# Patient Record
Sex: Female | Born: 1935
Health system: Southern US, Community
[De-identification: ages and names within clinical notes are randomized; demographics above are authoritative.]

## PROBLEM LIST (undated history)

## (undated) DIAGNOSIS — E0590 Thyrotoxicosis, unspecified without thyrotoxic crisis or storm: Secondary | ICD-10-CM

## (undated) DIAGNOSIS — E7800 Pure hypercholesterolemia, unspecified: Secondary | ICD-10-CM

## (undated) DIAGNOSIS — M1990 Unspecified osteoarthritis, unspecified site: Secondary | ICD-10-CM

## (undated) DIAGNOSIS — T7840XA Allergy, unspecified, initial encounter: Secondary | ICD-10-CM

## (undated) DIAGNOSIS — N6019 Diffuse cystic mastopathy of unspecified breast: Secondary | ICD-10-CM

## (undated) DIAGNOSIS — Z87898 Personal history of other specified conditions: Secondary | ICD-10-CM

## (undated) DIAGNOSIS — G250 Essential tremor: Secondary | ICD-10-CM

## (undated) DIAGNOSIS — E119 Type 2 diabetes mellitus without complications: Secondary | ICD-10-CM

## (undated) DIAGNOSIS — R011 Cardiac murmur, unspecified: Secondary | ICD-10-CM

## (undated) HISTORY — DX: Personal history of other specified conditions: Z87.898

## (undated) HISTORY — DX: Allergy, unspecified, initial encounter: T78.40XA

## (undated) HISTORY — DX: Essential tremor: G25.0

## (undated) HISTORY — DX: Thyrotoxicosis, unspecified without thyrotoxic crisis or storm: E05.90

## (undated) HISTORY — PX: BREAST BIOPSY: SHX20

## (undated) HISTORY — PX: SHOULDER ARTHROSCOPY: SHX128

## (undated) HISTORY — DX: Diffuse cystic mastopathy of unspecified breast: N60.19

## (undated) HISTORY — DX: Pure hypercholesterolemia, unspecified: E78.00

## (undated) HISTORY — DX: Type 2 diabetes mellitus without complications: E11.9

## (undated) HISTORY — PX: TONSILLECTOMY: SUR1361

## (undated) HISTORY — PX: CARPAL TUNNEL RELEASE: SHX101

---

## 2005-02-11 ENCOUNTER — Ambulatory Visit: Payer: Self-pay | Admitting: Internal Medicine

## 2005-05-06 ENCOUNTER — Ambulatory Visit: Payer: Self-pay | Admitting: Internal Medicine

## 2005-05-14 ENCOUNTER — Ambulatory Visit: Payer: Self-pay | Admitting: Internal Medicine

## 2006-03-17 ENCOUNTER — Ambulatory Visit: Payer: Self-pay | Admitting: Internal Medicine

## 2006-11-06 DIAGNOSIS — Z85828 Personal history of other malignant neoplasm of skin: Secondary | ICD-10-CM

## 2006-11-06 HISTORY — DX: Personal history of other malignant neoplasm of skin: Z85.828

## 2007-03-23 ENCOUNTER — Ambulatory Visit: Payer: Self-pay | Admitting: Internal Medicine

## 2007-03-25 ENCOUNTER — Ambulatory Visit: Payer: Self-pay | Admitting: Internal Medicine

## 2007-05-24 ENCOUNTER — Inpatient Hospital Stay: Payer: Self-pay | Admitting: Specialist

## 2007-05-24 ENCOUNTER — Other Ambulatory Visit: Payer: Self-pay

## 2008-03-28 ENCOUNTER — Ambulatory Visit: Payer: Self-pay | Admitting: Internal Medicine

## 2008-06-27 ENCOUNTER — Ambulatory Visit: Payer: Self-pay | Admitting: Internal Medicine

## 2008-07-28 ENCOUNTER — Ambulatory Visit: Payer: Self-pay | Admitting: Internal Medicine

## 2008-08-28 ENCOUNTER — Ambulatory Visit: Payer: Self-pay | Admitting: Internal Medicine

## 2008-09-25 ENCOUNTER — Ambulatory Visit: Payer: Self-pay | Admitting: Internal Medicine

## 2009-03-29 ENCOUNTER — Ambulatory Visit: Payer: Self-pay | Admitting: Internal Medicine

## 2010-04-02 ENCOUNTER — Ambulatory Visit: Payer: Self-pay | Admitting: Internal Medicine

## 2011-04-07 ENCOUNTER — Ambulatory Visit: Payer: Self-pay | Admitting: Internal Medicine

## 2011-12-03 DIAGNOSIS — R5381 Other malaise: Secondary | ICD-10-CM | POA: Diagnosis not present

## 2011-12-03 DIAGNOSIS — R5383 Other fatigue: Secondary | ICD-10-CM | POA: Diagnosis not present

## 2011-12-03 DIAGNOSIS — E7800 Pure hypercholesterolemia, unspecified: Secondary | ICD-10-CM | POA: Diagnosis not present

## 2011-12-03 DIAGNOSIS — R7989 Other specified abnormal findings of blood chemistry: Secondary | ICD-10-CM | POA: Diagnosis not present

## 2011-12-03 DIAGNOSIS — E039 Hypothyroidism, unspecified: Secondary | ICD-10-CM | POA: Diagnosis not present

## 2011-12-08 DIAGNOSIS — E039 Hypothyroidism, unspecified: Secondary | ICD-10-CM | POA: Diagnosis not present

## 2011-12-08 DIAGNOSIS — J309 Allergic rhinitis, unspecified: Secondary | ICD-10-CM | POA: Diagnosis not present

## 2011-12-08 DIAGNOSIS — R918 Other nonspecific abnormal finding of lung field: Secondary | ICD-10-CM | POA: Diagnosis not present

## 2011-12-08 DIAGNOSIS — E7800 Pure hypercholesterolemia, unspecified: Secondary | ICD-10-CM | POA: Diagnosis not present

## 2011-12-08 DIAGNOSIS — Z124 Encounter for screening for malignant neoplasm of cervix: Secondary | ICD-10-CM | POA: Diagnosis not present

## 2011-12-08 DIAGNOSIS — R319 Hematuria, unspecified: Secondary | ICD-10-CM | POA: Diagnosis not present

## 2011-12-16 DIAGNOSIS — Z1211 Encounter for screening for malignant neoplasm of colon: Secondary | ICD-10-CM | POA: Diagnosis not present

## 2011-12-29 DIAGNOSIS — R319 Hematuria, unspecified: Secondary | ICD-10-CM | POA: Diagnosis not present

## 2012-01-19 DIAGNOSIS — Z85828 Personal history of other malignant neoplasm of skin: Secondary | ICD-10-CM | POA: Diagnosis not present

## 2012-01-19 DIAGNOSIS — D485 Neoplasm of uncertain behavior of skin: Secondary | ICD-10-CM | POA: Diagnosis not present

## 2012-01-19 DIAGNOSIS — L851 Acquired keratosis [keratoderma] palmaris et plantaris: Secondary | ICD-10-CM | POA: Diagnosis not present

## 2012-01-19 DIAGNOSIS — L738 Other specified follicular disorders: Secondary | ICD-10-CM | POA: Diagnosis not present

## 2012-01-19 DIAGNOSIS — L259 Unspecified contact dermatitis, unspecified cause: Secondary | ICD-10-CM | POA: Diagnosis not present

## 2012-01-19 DIAGNOSIS — C44319 Basal cell carcinoma of skin of other parts of face: Secondary | ICD-10-CM | POA: Diagnosis not present

## 2012-01-19 DIAGNOSIS — L821 Other seborrheic keratosis: Secondary | ICD-10-CM | POA: Diagnosis not present

## 2012-01-26 DIAGNOSIS — C44319 Basal cell carcinoma of skin of other parts of face: Secondary | ICD-10-CM | POA: Diagnosis not present

## 2012-03-06 DIAGNOSIS — H2510 Age-related nuclear cataract, unspecified eye: Secondary | ICD-10-CM | POA: Diagnosis not present

## 2012-04-12 ENCOUNTER — Ambulatory Visit: Payer: Self-pay | Admitting: Internal Medicine

## 2012-04-12 DIAGNOSIS — Z1231 Encounter for screening mammogram for malignant neoplasm of breast: Secondary | ICD-10-CM | POA: Diagnosis not present

## 2012-05-07 DIAGNOSIS — H6120 Impacted cerumen, unspecified ear: Secondary | ICD-10-CM | POA: Diagnosis not present

## 2012-05-07 DIAGNOSIS — H6980 Other specified disorders of Eustachian tube, unspecified ear: Secondary | ICD-10-CM | POA: Diagnosis not present

## 2012-05-07 DIAGNOSIS — J301 Allergic rhinitis due to pollen: Secondary | ICD-10-CM | POA: Diagnosis not present

## 2012-05-10 DIAGNOSIS — Z23 Encounter for immunization: Secondary | ICD-10-CM | POA: Diagnosis not present

## 2012-06-28 DIAGNOSIS — Z85828 Personal history of other malignant neoplasm of skin: Secondary | ICD-10-CM | POA: Diagnosis not present

## 2012-06-28 DIAGNOSIS — D1800 Hemangioma unspecified site: Secondary | ICD-10-CM | POA: Diagnosis not present

## 2012-06-28 DIAGNOSIS — D239 Other benign neoplasm of skin, unspecified: Secondary | ICD-10-CM | POA: Diagnosis not present

## 2012-06-28 DIAGNOSIS — L819 Disorder of pigmentation, unspecified: Secondary | ICD-10-CM | POA: Diagnosis not present

## 2012-06-28 DIAGNOSIS — L821 Other seborrheic keratosis: Secondary | ICD-10-CM | POA: Diagnosis not present

## 2012-08-04 ENCOUNTER — Ambulatory Visit (INDEPENDENT_AMBULATORY_CARE_PROVIDER_SITE_OTHER): Payer: Medicare Other | Admitting: Internal Medicine

## 2012-08-04 ENCOUNTER — Encounter: Payer: Self-pay | Admitting: Internal Medicine

## 2012-08-04 VITALS — BP 118/66 | HR 68 | Temp 97.9°F | Ht 64.0 in | Wt 146.5 lb

## 2012-08-04 DIAGNOSIS — E1169 Type 2 diabetes mellitus with other specified complication: Secondary | ICD-10-CM | POA: Insufficient documentation

## 2012-08-04 DIAGNOSIS — Z139 Encounter for screening, unspecified: Secondary | ICD-10-CM

## 2012-08-04 DIAGNOSIS — M899 Disorder of bone, unspecified: Secondary | ICD-10-CM | POA: Diagnosis not present

## 2012-08-04 DIAGNOSIS — E119 Type 2 diabetes mellitus without complications: Secondary | ICD-10-CM | POA: Diagnosis not present

## 2012-08-04 DIAGNOSIS — E0590 Thyrotoxicosis, unspecified without thyrotoxic crisis or storm: Secondary | ICD-10-CM | POA: Diagnosis not present

## 2012-08-04 DIAGNOSIS — E7800 Pure hypercholesterolemia, unspecified: Secondary | ICD-10-CM | POA: Diagnosis not present

## 2012-08-04 DIAGNOSIS — M8580 Other specified disorders of bone density and structure, unspecified site: Secondary | ICD-10-CM

## 2012-08-04 DIAGNOSIS — M949 Disorder of cartilage, unspecified: Secondary | ICD-10-CM | POA: Diagnosis not present

## 2012-08-09 ENCOUNTER — Encounter: Payer: Self-pay | Admitting: Internal Medicine

## 2012-08-09 DIAGNOSIS — M8580 Other specified disorders of bone density and structure, unspecified site: Secondary | ICD-10-CM | POA: Insufficient documentation

## 2012-08-09 NOTE — Progress Notes (Signed)
  Subjective:    Patient ID: Shannon Mckinney, female    DOB: [birth year], 79 y.o.   MRN: [id number]  HPI 79 year old female with past history of hyperthyroidism s/p ablation now maintained on synthroid, hypercholesterolemia and FCD.  She comes in today for a scheduled follow up.  She states she is doing well.  No chest pain or tightness with increased activity or exertion.  No sob.  Eating and drinking well.  Bowels stable.  No acid reflux.    Past Medical History  Diagnosis Date  . Diabetes mellitus     diet controlled  . Hypercholesterolemia   . History of abnormal Pap smear   . Hyperthyroidism     s/p ablation  . Fibrocystic breast disease   . Benign head tremor     Current Outpatient Prescriptions on File Prior to Visit  Medication Sig Dispense Refill  . levothyroxine (SYNTHROID) 88 MCG tablet Take 88 mcg by mouth daily.      . rosuvastatin (CRESTOR) 5 MG tablet Take 5 mg by mouth daily.        Review of Systems Patient denies any headache, lightheadedness or dizziness.  No sinus or allergy symptoms.  No chest pain, tightness or palpitations.  No increased shortness of breath, cough or congestion.  No nausea or vomiting.  No abdominal pain or cramping.  No bowel change, such as diarrhea, constipation, BRBPR or melana.  No urine change.  Overall she feels she is doing well.       Objective:   Physical Exam Filed Vitals:   08/04/12 1113  BP: 118/66  Pulse: 68  Temp: 97.9 F (36.6 C)   Blood pressure recheck:  118/72, pulse 42  79 year old female in no acute distress.   HEENT:  Nares - clear.  OP- without lesions or erythema.  NECK:  Supple, nontender.  No audible bruit.   HEART:  Appears to be regular.  I/VI systolic murmur.   LUNGS:  Without crackles or wheezing audible.  Respirations even and unlabored.   RADIAL PULSE:  Equal bilaterally.  ABDOMEN:  Soft, nontender.  No audible abdominal bruit.   EXTREMITIES:  No increased edema to be present.                  Assessment  & Plan:  PULMONARY.  Previously had abnormal chest CT.  Followed by pulmonary.  Repeat chest CT did not reveal any lymph nodes that were previously present.  CXR 05/30/07 - no acute cardiopulmonary abnormality.  CXR 11/09/09 - no acute disease of the chest.  They did comment on a mild elevation of the right hemidiaphragm - but reported no significant change.  Obtain copy of last cxr from 12/08/11.    CARDIOVASCULAR.  ECHO 12/11/10 revealed EF 55%, mild LVH and mild AI.  Currently asymptomatic.    HEALTH MAINTENANCE.  Physical 12/08/11.  Obtain mammo results from 2013.  Has declined GI evaluation.

## 2012-08-09 NOTE — Assessment & Plan Note (Signed)
Low carb diet and exercise.  Check metabolic panel and a1c.   

## 2012-08-09 NOTE — Assessment & Plan Note (Signed)
Low cholesterol diet and exercise.  Continue Crestor.  Check lipid panel and liver function.   

## 2012-08-09 NOTE — Assessment & Plan Note (Signed)
S/p ablation.  Now maintained on synthroid.  Check tsh.

## 2012-08-09 NOTE — Assessment & Plan Note (Signed)
Bone density 12/11/10 - osteopenia.  Continue calcium, vitamin D and weight bearing exercise.

## 2012-08-17 ENCOUNTER — Other Ambulatory Visit (INDEPENDENT_AMBULATORY_CARE_PROVIDER_SITE_OTHER): Payer: Medicare Other

## 2012-08-17 DIAGNOSIS — E119 Type 2 diabetes mellitus without complications: Secondary | ICD-10-CM | POA: Diagnosis not present

## 2012-08-17 DIAGNOSIS — E0590 Thyrotoxicosis, unspecified without thyrotoxic crisis or storm: Secondary | ICD-10-CM | POA: Diagnosis not present

## 2012-08-17 DIAGNOSIS — E7800 Pure hypercholesterolemia, unspecified: Secondary | ICD-10-CM

## 2012-08-17 LAB — LIPID PANEL
HDL: 57.4 mg/dL (ref >39.00)
Total CHOL/HDL Ratio: 3
VLDL: 24.2 mg/dL (ref 0.0–40.0)

## 2012-08-17 LAB — HEPATIC FUNCTION PANEL
ALT: 14 U/L (ref 0–35)
Albumin: 4.2 g/dL (ref 3.5–5.2)
Total Bilirubin: 0.9 mg/dL (ref 0.3–1.2)

## 2012-08-17 LAB — BASIC METABOLIC PANEL
BUN: 16 mg/dL (ref 6–23)
Chloride: 100 mEq/L (ref 96–112)
Glucose, Bld: 117 mg/dL — ABNORMAL HIGH (ref 70–99)
Potassium: 4.3 mEq/L (ref 3.5–5.1)

## 2012-08-17 LAB — TSH: TSH: 1.62 u[IU]/mL (ref 0.35–5.50)

## 2012-08-17 LAB — CBC WITH DIFFERENTIAL/PLATELET
Basophils Relative: 0.3 % (ref 0.0–3.0)
Eosinophils Relative: 2.6 % (ref 0.0–5.0)
Lymphocytes Relative: 27.3 % (ref 12.0–46.0)
MCV: 89.8 fl (ref 78.0–100.0)
Monocytes Absolute: 0.4 10*3/uL (ref 0.1–1.0)
Monocytes Relative: 6.8 % (ref 3.0–12.0)
Neutrophils Relative %: 63 % (ref 43.0–77.0)
RBC: 4.53 Mil/uL (ref 3.87–5.11)
WBC: 6.4 10*3/uL (ref 4.5–10.5)

## 2012-12-13 ENCOUNTER — Ambulatory Visit (INDEPENDENT_AMBULATORY_CARE_PROVIDER_SITE_OTHER): Payer: Medicare Other | Admitting: Internal Medicine

## 2012-12-13 ENCOUNTER — Encounter: Payer: Self-pay | Admitting: Internal Medicine

## 2012-12-13 VITALS — BP 130/70 | HR 64 | Temp 97.8°F | Ht 64.0 in | Wt 147.5 lb

## 2012-12-13 DIAGNOSIS — E0590 Thyrotoxicosis, unspecified without thyrotoxic crisis or storm: Secondary | ICD-10-CM | POA: Diagnosis not present

## 2012-12-13 DIAGNOSIS — E119 Type 2 diabetes mellitus without complications: Secondary | ICD-10-CM

## 2012-12-13 DIAGNOSIS — E7800 Pure hypercholesterolemia, unspecified: Secondary | ICD-10-CM | POA: Diagnosis not present

## 2012-12-13 DIAGNOSIS — M949 Disorder of cartilage, unspecified: Secondary | ICD-10-CM | POA: Diagnosis not present

## 2012-12-13 DIAGNOSIS — M8580 Other specified disorders of bone density and structure, unspecified site: Secondary | ICD-10-CM

## 2012-12-13 DIAGNOSIS — M899 Disorder of bone, unspecified: Secondary | ICD-10-CM

## 2012-12-20 ENCOUNTER — Encounter: Payer: Self-pay | Admitting: Internal Medicine

## 2012-12-20 NOTE — Assessment & Plan Note (Signed)
Bone density 12/11/10 - osteopenia.  Continue calcium, vitamin D and weight bearing exercise.

## 2012-12-20 NOTE — Assessment & Plan Note (Signed)
S/p ablation.  Now maintained on synthroid.  Follow tsh.   

## 2012-12-20 NOTE — Assessment & Plan Note (Signed)
Low carb diet and exercise.  Check metabolic panel and a1c.   

## 2012-12-20 NOTE — Progress Notes (Signed)
  Subjective:    Patient ID: Shannon Mckinney, female    DOB: 06-Nov-1928, 79 y.o.   MRN: [id number]  HPI 79 year old female with past history of hyperthyroidism s/p ablation now maintained on synthroid, hypercholesterolemia and FCD.  She comes in today to follow up on these issues as well as for a complete physical exam.  She states she is doing well.  No chest pain or tightness with increased activity or exertion.  No sob.  Eating and drinking well.  Bowels stable.  No acid reflux.  Overall feels good.    Past Medical History  Diagnosis Date  . Diabetes mellitus     diet controlled  . Hypercholesterolemia   . History of abnormal Pap smear   . Hyperthyroidism     s/p ablation  . Fibrocystic breast disease   . Benign head tremor     Current Outpatient Prescriptions on File Prior to Visit  Medication Sig Dispense Refill  . aspirin 81 MG tablet Take 81 mg by mouth daily.      . Cholecalciferol (VITAMIN D-3) 1000 UNITS CAPS Take 1 capsule by mouth daily.      . Cyanocobalamin (VITAMIN B-12 PO) Take by mouth as needed.      Marland Kitchen levothyroxine (SYNTHROID) 88 MCG tablet Take 88 mcg by mouth daily.      . rosuvastatin (CRESTOR) 5 MG tablet Take 5 mg by mouth daily.       No current facility-administered medications on file prior to visit.    Review of Systems Patient denies any headache, lightheadedness or dizziness.  No sinus or allergy symptoms.  No chest pain, tightness or palpitations.  No increased shortness of breath, cough or congestion.  No nausea or vomiting.  No abdominal pain or cramping.  No bowel change, such as diarrhea, constipation, BRBPR or melana.  No urine change.  Overall she feels she is doing well.       Objective:   Physical Exam  Filed Vitals:   12/13/12 1101  BP: 130/70  Pulse: 64  Temp: 97.8 F (36.6 C)   Pulse 32  79 year old female in no acute distress.   HEENT:  Nares- clear.  Oropharynx - without lesions. NECK:  Supple.  Nontender.  No audible bruit.   HEART:  Appears to be regular. LUNGS:  No crackles or wheezing audible.  Respirations even and unlabored.  RADIAL PULSE:  Equal bilaterally.    BREASTS:  No nipple discharge or nipple retraction present.  Could not appreciate any distinct nodules or axillary adenopathy.  ABDOMEN:  Soft, nontender.  Bowel sounds present and normal.  No audible abdominal bruit.  EXTREMITIES:  No increased edema present.  DP pulses palpable and equal bilaterally.          Assessment & Plan:  PULMONARY.  Previously had abnormal chest CT.  Followed by pulmonary.  Repeat chest CT did not reveal any lymph nodes that were previously present.  CXR 05/30/07 - no acute cardiopulmonary abnormality.  CXR 11/09/09 - no acute disease of the chest.  They did comment on a mild elevation of the right hemidiaphragm - but reported no significant change.  CXR 12/08/11 revealed clear lungs.  No acute disease of the chest.   CARDIOVASCULAR.  ECHO 12/11/10 revealed EF 55%, mild LVH and mild AI.  Currently asymptomatic.    HEALTH MAINTENANCE.  Physical today.  Mammogram 04/12/12 -Birads II.  Has declined GI evaluation.

## 2012-12-20 NOTE — Assessment & Plan Note (Signed)
Low cholesterol diet and exercise.  Continue Crestor.  Check lipid panel and liver function.   

## 2012-12-27 ENCOUNTER — Other Ambulatory Visit (INDEPENDENT_AMBULATORY_CARE_PROVIDER_SITE_OTHER): Payer: Medicare Other

## 2012-12-27 DIAGNOSIS — E7800 Pure hypercholesterolemia, unspecified: Secondary | ICD-10-CM

## 2012-12-27 DIAGNOSIS — L819 Disorder of pigmentation, unspecified: Secondary | ICD-10-CM | POA: Diagnosis not present

## 2012-12-27 DIAGNOSIS — E0590 Thyrotoxicosis, unspecified without thyrotoxic crisis or storm: Secondary | ICD-10-CM | POA: Diagnosis not present

## 2012-12-27 DIAGNOSIS — E119 Type 2 diabetes mellitus without complications: Secondary | ICD-10-CM

## 2012-12-27 DIAGNOSIS — D1800 Hemangioma unspecified site: Secondary | ICD-10-CM | POA: Diagnosis not present

## 2012-12-27 DIAGNOSIS — L821 Other seborrheic keratosis: Secondary | ICD-10-CM | POA: Diagnosis not present

## 2012-12-27 DIAGNOSIS — Z85828 Personal history of other malignant neoplasm of skin: Secondary | ICD-10-CM | POA: Diagnosis not present

## 2012-12-27 DIAGNOSIS — D239 Other benign neoplasm of skin, unspecified: Secondary | ICD-10-CM | POA: Diagnosis not present

## 2012-12-27 LAB — LIPID PANEL
Cholesterol: 172 mg/dL (ref 0–200)
HDL: 59.5 mg/dL (ref >39.00)
LDL Cholesterol: 88 mg/dL (ref 0–99)
Total CHOL/HDL Ratio: 3
Triglycerides: 124 mg/dL (ref 0.0–149.0)

## 2012-12-27 LAB — BASIC METABOLIC PANEL
CO2: 26 mEq/L (ref 19–32)
Chloride: 103 mEq/L (ref 96–112)
Glucose, Bld: 105 mg/dL — ABNORMAL HIGH (ref 70–99)
Potassium: 3.8 mEq/L (ref 3.5–5.1)
Sodium: 141 mEq/L (ref 135–145)

## 2012-12-28 ENCOUNTER — Other Ambulatory Visit: Payer: Self-pay | Admitting: Internal Medicine

## 2012-12-28 ENCOUNTER — Other Ambulatory Visit: Payer: Self-pay | Admitting: *Deleted

## 2012-12-28 DIAGNOSIS — E039 Hypothyroidism, unspecified: Secondary | ICD-10-CM

## 2012-12-28 MED ORDER — LEVOTHYROXINE SODIUM 75 MCG PO TABS: 75.0000 ug | ORAL_TABLET | Freq: Every day | ORAL | Status: DC

## 2012-12-28 NOTE — Progress Notes (Signed)
Order placed for f/u tsh.  

## 2012-12-30 ENCOUNTER — Other Ambulatory Visit: Payer: Self-pay | Admitting: *Deleted

## 2012-12-30 MED ORDER — LEVOTHYROXINE SODIUM 75 MCG PO TABS: 75.0000 ug | ORAL_TABLET | Freq: Every day | ORAL | Status: DC

## 2013-02-01 ENCOUNTER — Other Ambulatory Visit (INDEPENDENT_AMBULATORY_CARE_PROVIDER_SITE_OTHER): Payer: Medicare Other

## 2013-02-01 ENCOUNTER — Other Ambulatory Visit: Payer: Self-pay | Admitting: *Deleted

## 2013-02-01 DIAGNOSIS — E039 Hypothyroidism, unspecified: Secondary | ICD-10-CM

## 2013-02-01 LAB — TSH: TSH: 0.99 u[IU]/mL (ref 0.35–5.50)

## 2013-02-03 ENCOUNTER — Encounter: Payer: Self-pay | Admitting: Internal Medicine

## 2013-02-07 ENCOUNTER — Other Ambulatory Visit: Payer: Self-pay | Admitting: *Deleted

## 2013-02-07 MED ORDER — LEVOTHYROXINE SODIUM 75 MCG PO TABS: 75.0000 ug | ORAL_TABLET | Freq: Every day | ORAL | Status: DC

## 2013-02-07 NOTE — Telephone Encounter (Signed)
Sent 30 day supply to local pharmacy because pt is completely out of meds. 90 day sent to [facility] mail also. Pt notified

## 2013-02-16 ENCOUNTER — Other Ambulatory Visit: Payer: Self-pay | Admitting: *Deleted

## 2013-02-16 MED ORDER — ROSUVASTATIN CALCIUM 5 MG PO TABS: 5.0000 mg | ORAL_TABLET | Freq: Every day | ORAL | Status: DC

## 2013-03-02 ENCOUNTER — Other Ambulatory Visit: Payer: Self-pay

## 2013-03-05 DIAGNOSIS — H2510 Age-related nuclear cataract, unspecified eye: Secondary | ICD-10-CM | POA: Diagnosis not present

## 2013-03-16 DIAGNOSIS — H6980 Other specified disorders of Eustachian tube, unspecified ear: Secondary | ICD-10-CM | POA: Diagnosis not present

## 2013-03-16 DIAGNOSIS — H6120 Impacted cerumen, unspecified ear: Secondary | ICD-10-CM | POA: Diagnosis not present

## 2013-04-29 DIAGNOSIS — Z23 Encounter for immunization: Secondary | ICD-10-CM | POA: Diagnosis not present

## 2013-05-21 DIAGNOSIS — Z0000 Encounter for general adult medical examination without abnormal findings: Secondary | ICD-10-CM | POA: Diagnosis not present

## 2013-06-02 ENCOUNTER — Other Ambulatory Visit: Payer: Self-pay

## 2013-06-16 ENCOUNTER — Ambulatory Visit (INDEPENDENT_AMBULATORY_CARE_PROVIDER_SITE_OTHER): Payer: Medicare Other | Admitting: Internal Medicine

## 2013-06-16 ENCOUNTER — Encounter: Payer: Self-pay | Admitting: Internal Medicine

## 2013-06-16 VITALS — BP 110/60 | HR 85 | Temp 98.1°F | Ht 64.0 in | Wt 144.2 lb

## 2013-06-16 DIAGNOSIS — M8580 Other specified disorders of bone density and structure, unspecified site: Secondary | ICD-10-CM

## 2013-06-16 DIAGNOSIS — E0590 Thyrotoxicosis, unspecified without thyrotoxic crisis or storm: Secondary | ICD-10-CM

## 2013-06-16 DIAGNOSIS — Z1239 Encounter for other screening for malignant neoplasm of breast: Secondary | ICD-10-CM

## 2013-06-16 DIAGNOSIS — M899 Disorder of bone, unspecified: Secondary | ICD-10-CM

## 2013-06-16 DIAGNOSIS — E119 Type 2 diabetes mellitus without complications: Secondary | ICD-10-CM

## 2013-06-16 DIAGNOSIS — E7800 Pure hypercholesterolemia, unspecified: Secondary | ICD-10-CM | POA: Diagnosis not present

## 2013-06-16 NOTE — Progress Notes (Signed)
  Subjective:    Patient ID: Shannon Mckinney, female    DOB: 02-Dec-1928, 79 y.o.   MRN: [id number]  HPI 79 year old female with past history of hyperthyroidism s/p ablation now maintained on synthroid, hypercholesterolemia and FCD.  She comes in today for a scheduled follow up.  She states she is doing well.  No chest pain or tightness with increased activity or exertion.  No sob.  Eating and drinking well.  Bowels stable.  No acid reflux.  Overall feels good.    Past Medical History  Diagnosis Date  . Diabetes mellitus     diet controlled  . Hypercholesterolemia   . History of abnormal Pap smear   . Hyperthyroidism     s/p ablation  . Fibrocystic breast disease   . Benign head tremor     Current Outpatient Prescriptions on File Prior to Visit  Medication Sig Dispense Refill  . Cholecalciferol (VITAMIN D-3) 1000 UNITS CAPS Take 1 capsule by mouth daily.      Marland Kitchen levothyroxine (SYNTHROID) 75 MCG tablet Take 1 tablet (75 mcg total) by mouth daily before breakfast.  30 tablet  0  . rosuvastatin (CRESTOR) 5 MG tablet Take 1 tablet (5 mg total) by mouth daily.  30 tablet  0   No current facility-administered medications on file prior to visit.    Review of Systems Patient denies any headache, lightheadedness or dizziness.  No sinus or allergy symptoms.  No chest pain, tightness or palpitations.  No increased shortness of breath, cough or congestion.  No nausea or vomiting.  No abdominal pain or cramping.  No bowel change, such as diarrhea, constipation, BRBPR or melana.  No urine change.  Overall she feels she is doing well.       Objective:   Physical Exam  Filed Vitals:   06/16/13 1410  BP: 110/60  Pulse: 85  Temp: 98.1 F (23.18 C)   79 year old female in no acute distress.   HEENT:  Nares- clear.  Oropharynx - without lesions. NECK:  Supple.  Nontender.  No audible bruit.  HEART:  Appears to be regular. LUNGS:  No crackles or wheezing audible.  Respirations even and unlabored.   RADIAL PULSE:  Equal bilaterally.     ABDOMEN:  Soft, nontender.  Bowel sounds present and normal.  No audible abdominal bruit.  EXTREMITIES:  No increased edema present.  DP pulses palpable and equal bilaterally.          Assessment & Plan:  PULMONARY.  Previously had abnormal chest CT.  Followed by pulmonary.  Repeat chest CT did not reveal any lymph nodes that were previously present.  CXR 05/30/07 - no acute cardiopulmonary abnormality.  CXR 11/09/09 - no acute disease of the chest.  They did comment on a mild elevation of the right hemidiaphragm - but reported no significant change.  CXR 12/08/11 revealed clear lungs.  No acute disease of the chest.   CARDIOVASCULAR.  ECHO 12/11/10 revealed EF 55%, mild LVH and mild AI.  Currently asymptomatic.    HEALTH MAINTENANCE.  Physical last visit.  Mammogram 04/12/12 -Birads II.  Has declined GI evaluation.  Schedule f/u mammogram.

## 2013-06-16 NOTE — Progress Notes (Signed)
Pre-visit discussion using our clinic review tool. No additional management support is needed unless otherwise documented below in the visit note.  

## 2013-06-17 ENCOUNTER — Encounter: Payer: Self-pay | Admitting: Emergency Medicine

## 2013-06-19 ENCOUNTER — Encounter: Payer: Self-pay | Admitting: Internal Medicine

## 2013-06-19 NOTE — Assessment & Plan Note (Signed)
Low carb diet and exercise.  Check metabolic panel and a1c.   

## 2013-06-19 NOTE — Assessment & Plan Note (Signed)
Low cholesterol diet and exercise.  Continue Crestor.  Check lipid panel and liver function.   

## 2013-06-19 NOTE — Assessment & Plan Note (Signed)
Bone density 12/11/10 - osteopenia.  Continue vitamin Dsupplements and weight bearing exercise.

## 2013-06-19 NOTE — Assessment & Plan Note (Signed)
S/p ablation.  Now maintained on synthroid.  Follow tsh.   

## 2013-06-20 ENCOUNTER — Telehealth: Payer: Self-pay | Admitting: Internal Medicine

## 2013-06-20 NOTE — Telephone Encounter (Signed)
FYI

## 2013-06-20 NOTE — Telephone Encounter (Signed)
Record request sent to [facility] for immunizations.  Received fax from [facility] that there are no documents/records for immunizations at [facility] for the pt.  Sent to to scan.

## 2013-06-20 NOTE — Telephone Encounter (Signed)
Notify pt that per [facility] - there are no records of immunizations given there.  If she has not had pneumonia shot anywhere else - then would recommend prevnar.

## 2013-06-21 ENCOUNTER — Encounter: Payer: Self-pay | Admitting: *Deleted

## 2013-06-21 NOTE — Telephone Encounter (Signed)
Sent mychart message

## 2013-06-22 ENCOUNTER — Other Ambulatory Visit (INDEPENDENT_AMBULATORY_CARE_PROVIDER_SITE_OTHER): Payer: Medicare Other

## 2013-06-22 DIAGNOSIS — E7800 Pure hypercholesterolemia, unspecified: Secondary | ICD-10-CM | POA: Diagnosis not present

## 2013-06-22 DIAGNOSIS — E119 Type 2 diabetes mellitus without complications: Secondary | ICD-10-CM

## 2013-06-22 DIAGNOSIS — E0590 Thyrotoxicosis, unspecified without thyrotoxic crisis or storm: Secondary | ICD-10-CM

## 2013-06-22 LAB — MICROALBUMIN / CREATININE URINE RATIO
Creatinine,U: 146.2 mg/dL
Microalb Creat Ratio: 0.7 mg/g (ref 0.0–30.0)
Microalb, Ur: 1 mg/dL (ref 0.0–1.9)

## 2013-06-22 LAB — COMPREHENSIVE METABOLIC PANEL
ALT: 14 U/L (ref 0–35)
AST: 15 U/L (ref 0–37)
Albumin: 4.3 g/dL (ref 3.5–5.2)
BUN: 16 mg/dL (ref 6–23)
CO2: 31 mEq/L (ref 19–32)
Calcium: 9 mg/dL (ref 8.4–10.5)
Chloride: 101 mEq/L (ref 96–112)
Glucose, Bld: 106 mg/dL — ABNORMAL HIGH (ref 70–99)
Potassium: 3.9 mEq/L (ref 3.5–5.1)
Sodium: 137 mEq/L (ref 135–145)
Total Protein: 7.3 g/dL (ref 6.0–8.3)

## 2013-06-22 LAB — LIPID PANEL
Cholesterol: 211 mg/dL — ABNORMAL HIGH (ref 0–200)
Total CHOL/HDL Ratio: 3
Triglycerides: 112 mg/dL (ref 0.0–149.0)

## 2013-06-22 LAB — HEMOGLOBIN A1C: Hgb A1c MFr Bld: 6.6 % — ABNORMAL HIGH (ref 4.6–6.5)

## 2013-06-24 ENCOUNTER — Encounter: Payer: Self-pay | Admitting: Internal Medicine

## 2013-06-28 DIAGNOSIS — D239 Other benign neoplasm of skin, unspecified: Secondary | ICD-10-CM | POA: Diagnosis not present

## 2013-06-28 DIAGNOSIS — D1800 Hemangioma unspecified site: Secondary | ICD-10-CM | POA: Diagnosis not present

## 2013-06-28 DIAGNOSIS — Z85828 Personal history of other malignant neoplasm of skin: Secondary | ICD-10-CM | POA: Diagnosis not present

## 2013-06-28 DIAGNOSIS — L820 Inflamed seborrheic keratosis: Secondary | ICD-10-CM | POA: Diagnosis not present

## 2013-06-28 DIAGNOSIS — L219 Seborrheic dermatitis, unspecified: Secondary | ICD-10-CM | POA: Diagnosis not present

## 2013-06-28 DIAGNOSIS — L821 Other seborrheic keratosis: Secondary | ICD-10-CM | POA: Diagnosis not present

## 2013-06-29 ENCOUNTER — Other Ambulatory Visit: Payer: Self-pay | Admitting: *Deleted

## 2013-06-29 MED ORDER — ROSUVASTATIN CALCIUM 10 MG PO TABS: 10.0000 mg | ORAL_TABLET | Freq: Every day | ORAL | Status: DC

## 2013-07-04 ENCOUNTER — Ambulatory Visit (INDEPENDENT_AMBULATORY_CARE_PROVIDER_SITE_OTHER): Payer: Medicare Other | Admitting: *Deleted

## 2013-07-04 DIAGNOSIS — Z23 Encounter for immunization: Secondary | ICD-10-CM | POA: Diagnosis not present

## 2013-07-29 DIAGNOSIS — R059 Cough, unspecified: Secondary | ICD-10-CM | POA: Diagnosis not present

## 2013-07-29 DIAGNOSIS — R05 Cough: Secondary | ICD-10-CM | POA: Diagnosis not present

## 2013-07-29 DIAGNOSIS — J209 Acute bronchitis, unspecified: Secondary | ICD-10-CM | POA: Diagnosis not present

## 2013-07-29 DIAGNOSIS — J019 Acute sinusitis, unspecified: Secondary | ICD-10-CM | POA: Diagnosis not present

## 2013-07-29 DIAGNOSIS — H6690 Otitis media, unspecified, unspecified ear: Secondary | ICD-10-CM | POA: Diagnosis not present

## 2013-08-10 ENCOUNTER — Ambulatory Visit: Payer: Self-pay | Admitting: Internal Medicine

## 2013-08-10 DIAGNOSIS — Z1231 Encounter for screening mammogram for malignant neoplasm of breast: Secondary | ICD-10-CM | POA: Diagnosis not present

## 2013-08-10 LAB — HM MAMMOGRAPHY: HM MAMMO: NEGATIVE

## 2013-08-11 ENCOUNTER — Encounter: Payer: Self-pay | Admitting: Internal Medicine

## 2013-12-16 ENCOUNTER — Encounter: Payer: Medicare Other | Admitting: Internal Medicine

## 2013-12-27 DIAGNOSIS — L819 Disorder of pigmentation, unspecified: Secondary | ICD-10-CM | POA: Diagnosis not present

## 2013-12-27 DIAGNOSIS — D1800 Hemangioma unspecified site: Secondary | ICD-10-CM | POA: Diagnosis not present

## 2013-12-27 DIAGNOSIS — Z85828 Personal history of other malignant neoplasm of skin: Secondary | ICD-10-CM | POA: Diagnosis not present

## 2013-12-27 DIAGNOSIS — D239 Other benign neoplasm of skin, unspecified: Secondary | ICD-10-CM | POA: Diagnosis not present

## 2013-12-27 DIAGNOSIS — L821 Other seborrheic keratosis: Secondary | ICD-10-CM | POA: Diagnosis not present

## 2013-12-27 DIAGNOSIS — I8390 Asymptomatic varicose veins of unspecified lower extremity: Secondary | ICD-10-CM | POA: Diagnosis not present

## 2014-01-31 ENCOUNTER — Ambulatory Visit (INDEPENDENT_AMBULATORY_CARE_PROVIDER_SITE_OTHER): Payer: Medicare Other | Admitting: Internal Medicine

## 2014-01-31 ENCOUNTER — Encounter: Payer: Self-pay | Admitting: Internal Medicine

## 2014-01-31 VITALS — BP 122/70 | HR 68 | Temp 98.1°F | Ht 63.5 in | Wt 138.5 lb

## 2014-01-31 DIAGNOSIS — E0590 Thyrotoxicosis, unspecified without thyrotoxic crisis or storm: Secondary | ICD-10-CM | POA: Diagnosis not present

## 2014-01-31 DIAGNOSIS — E119 Type 2 diabetes mellitus without complications: Secondary | ICD-10-CM

## 2014-01-31 DIAGNOSIS — M899 Disorder of bone, unspecified: Secondary | ICD-10-CM | POA: Diagnosis not present

## 2014-01-31 DIAGNOSIS — E7800 Pure hypercholesterolemia, unspecified: Secondary | ICD-10-CM

## 2014-01-31 DIAGNOSIS — R221 Localized swelling, mass and lump, neck: Secondary | ICD-10-CM

## 2014-01-31 DIAGNOSIS — M949 Disorder of cartilage, unspecified: Secondary | ICD-10-CM | POA: Diagnosis not present

## 2014-01-31 DIAGNOSIS — M8580 Other specified disorders of bone density and structure, unspecified site: Secondary | ICD-10-CM

## 2014-01-31 DIAGNOSIS — R220 Localized swelling, mass and lump, head: Secondary | ICD-10-CM

## 2014-01-31 MED ORDER — NYSTATIN 100000 UNIT/GM EX CREA: 1.0000 "application " | TOPICAL_CREAM | Freq: Two times a day (BID) | CUTANEOUS | Status: DC

## 2014-01-31 NOTE — Progress Notes (Signed)
Pre visit review using our clinic review tool, if applicable. No additional management support is needed unless otherwise documented below in the visit note. 

## 2014-01-31 NOTE — Assessment & Plan Note (Signed)
S/p ablation.  Now maintained on synthroid.  Follow tsh.

## 2014-01-31 NOTE — Progress Notes (Signed)
  Subjective:    Patient ID: Shannon Mckinney, female    DOB: 1928-09-19, 80 y.o.   MRN: [id number]  HPI 80 year old female with past history of hyperthyroidism s/p ablation now maintained on synthroid, hypercholesterolemia and FCD.  She comes in today to follow up on these issues as well as for a complete physical exam.  She states she is doing well.  No chest pain or tightness with increased activity or exertion.  No sob.  Eating and drinking well.  Bowels stable.  No acid reflux.  Overall feels good.    Past Medical History  Diagnosis Date  . Diabetes mellitus     diet controlled  . Hypercholesterolemia   . History of abnormal Pap smear   . Hyperthyroidism     s/p ablation  . Fibrocystic breast disease   . Benign head tremor     Current Outpatient Prescriptions on File Prior to Visit  Medication Sig Dispense Refill  . Cholecalciferol (VITAMIN D-3) 1000 UNITS CAPS Take 1 capsule by mouth daily.      Marland Kitchen levothyroxine (SYNTHROID) 75 MCG tablet Take 1 tablet (75 mcg total) by mouth daily before breakfast.  30 tablet  0   No current facility-administered medications on file prior to visit.    Review of Systems Patient denies any headache, lightheadedness or dizziness.  No sinus or allergy symptoms.  No chest pain, tightness or palpitations.  No increased shortness of breath, cough or congestion.  No nausea or vomiting.  No abdominal pain or cramping.  No bowel change, such as diarrhea, constipation, BRBPR or melana.  No urine change.  Overall she feels she is doing well.       Objective:   Physical Exam  Filed Vitals:   01/31/14 0931  BP: 122/70  Pulse: 68  Temp: 98.1 F (52.6 C)   80 year old female in no acute distress.   HEENT:  Nares- clear.  Oropharynx - without lesions. NECK:  Supple.  Nontender.  No audible bruit.  Right neck fullness/question of thyroid fullness.  HEART:  Appears to be regular. LUNGS:  No crackles or wheezing audible.  Respirations even and unlabored.   RADIAL PULSE:  Equal bilaterally.    BREASTS:  No nipple discharge or nipple retraction present.  Could not appreciate any distinct nodules or axillary adenopathy.  ABDOMEN:  Soft, nontender.  Bowel sounds present and normal.  No audible abdominal bruit.  GU:  Not performed.   EXTREMITIES:  No increased edema present.  DP pulses palpable and equal bilaterally.          Assessment & Plan:  PULMONARY.  Previously had abnormal chest CT.  Followed by pulmonary.  Repeat chest CT did not reveal any lymph nodes that were previously present.  CXR 05/30/07 - no acute cardiopulmonary abnormality.  CXR 11/09/09 - no acute disease of the chest.  They did comment on a mild elevation of the right hemidiaphragm - but reported no significant change.  CXR 12/08/11 revealed clear lungs.  No acute disease of the chest.   CARDIOVASCULAR.  ECHO 12/11/10 revealed EF 55%, mild LVH and mild AI.  Currently asymptomatic.    HEALTH MAINTENANCE.  Physical today.  Mammogram 08/10/13 -Birads I.  Has declined GI evaluation.   I spent 25 minutes with the patient and more than 50% of the time was spent in consultation regarding the above.

## 2014-02-02 ENCOUNTER — Other Ambulatory Visit: Payer: Medicare Other

## 2014-02-02 LAB — MICROALBUMIN / CREATININE URINE RATIO
Creatinine,U: 218 mg/dL
Microalb Creat Ratio: 0.9 mg/g (ref 0.0–30.0)
Microalb, Ur: 2 mg/dL — ABNORMAL HIGH (ref 0.0–1.9)

## 2014-02-02 LAB — COMPREHENSIVE METABOLIC PANEL
ALBUMIN: 4.4 g/dL (ref 3.5–5.2)
ALT: 13 U/L (ref 0–35)
AST: 18 U/L (ref 0–37)
Alkaline Phosphatase: 54 U/L (ref 39–117)
BUN: 15 mg/dL (ref 6–23)
CO2: 31 mEq/L (ref 19–32)
Calcium: 9.5 mg/dL (ref 8.4–10.5)
Chloride: 100 mEq/L (ref 96–112)
Creatinine, Ser: 0.8 mg/dL (ref 0.4–1.2)
GFR: 76.06 mL/min (ref >60.00)
Glucose, Bld: 115 mg/dL — ABNORMAL HIGH (ref 70–99)
POTASSIUM: 4.3 meq/L (ref 3.5–5.1)
Sodium: 138 mEq/L (ref 135–145)
Total Bilirubin: 1 mg/dL (ref 0.2–1.2)
Total Protein: 7.1 g/dL (ref 6.0–8.3)

## 2014-02-02 LAB — TSH: TSH: 2.41 u[IU]/mL (ref 0.35–4.50)

## 2014-02-02 LAB — LIPID PANEL
CHOL/HDL RATIO: 5
Cholesterol: 292 mg/dL — ABNORMAL HIGH (ref 0–200)
HDL: 62.4 mg/dL (ref >39.00)
LDL CALC: 200 mg/dL — AB (ref 0–99)
NONHDL: 229.6
Triglycerides: 147 mg/dL (ref 0.0–149.0)
VLDL: 29.4 mg/dL (ref 0.0–40.0)

## 2014-02-02 LAB — VITAMIN D 25 HYDROXY (VIT D DEFICIENCY, FRACTURES): VITD: 47.8 ng/mL

## 2014-02-02 LAB — HEMOGLOBIN A1C: Hgb A1c MFr Bld: 6.4 % (ref 4.6–6.5)

## 2014-02-05 ENCOUNTER — Encounter: Payer: Self-pay | Admitting: Internal Medicine

## 2014-02-05 DIAGNOSIS — E041 Nontoxic single thyroid nodule: Secondary | ICD-10-CM | POA: Insufficient documentation

## 2014-02-05 NOTE — Assessment & Plan Note (Signed)
Low cholesterol diet and exercise.  Continue Crestor.  Check lipid panel and liver function.

## 2014-02-05 NOTE — Assessment & Plan Note (Signed)
Fullness of neck noted on exam  Question thyroid fullness.  Check thyroid ultrasound.  Check TFTs.

## 2014-02-05 NOTE — Assessment & Plan Note (Signed)
Bone density 12/11/10 - osteopenia.  Continue vitamin Dsupplements and weight bearing exercise.  Will let me know when agreeable for f/u bone density.

## 2014-02-05 NOTE — Assessment & Plan Note (Signed)
Low carb diet and exercise.  Check metabolic panel and F8B.

## 2014-02-09 ENCOUNTER — Encounter: Payer: Self-pay | Admitting: Internal Medicine

## 2014-02-13 NOTE — Telephone Encounter (Signed)
Mailed unread message to pt  

## 2014-02-14 ENCOUNTER — Ambulatory Visit: Payer: Self-pay | Admitting: Internal Medicine

## 2014-02-14 DIAGNOSIS — R220 Localized swelling, mass and lump, head: Secondary | ICD-10-CM | POA: Diagnosis not present

## 2014-02-14 DIAGNOSIS — E049 Nontoxic goiter, unspecified: Secondary | ICD-10-CM | POA: Diagnosis not present

## 2014-02-14 DIAGNOSIS — E0590 Thyrotoxicosis, unspecified without thyrotoxic crisis or storm: Secondary | ICD-10-CM | POA: Diagnosis not present

## 2014-02-20 ENCOUNTER — Other Ambulatory Visit: Payer: Self-pay | Admitting: *Deleted

## 2014-02-20 ENCOUNTER — Telehealth: Payer: Self-pay | Admitting: Internal Medicine

## 2014-02-20 DIAGNOSIS — E7800 Pure hypercholesterolemia, unspecified: Secondary | ICD-10-CM

## 2014-02-20 MED ORDER — ROSUVASTATIN CALCIUM 10 MG PO TABS: 10.0000 mg | ORAL_TABLET | Freq: Every day | ORAL | Status: DC

## 2014-02-20 MED ORDER — LEVOTHYROXINE SODIUM 75 MCG PO TABS: 75.0000 ug | ORAL_TABLET | Freq: Every day | ORAL | Status: DC

## 2014-02-20 NOTE — Telephone Encounter (Signed)
Left message on VM, lab appoint 8.24.15 @9am 

## 2014-02-20 NOTE — Telephone Encounter (Signed)
Pt was off of crestor and started taking medication had left over from home and is needing to get a refill of her crestor and synthroid.

## 2014-02-20 NOTE — Telephone Encounter (Signed)
Per recent my chart message, she was advised to start back on crestor.  I sent in rx for crestor 10mg  q day #30 with 2 refills.  She needs a liver panel checked in 5 weeks.  Please schedule and notify her of appt date and time.  I have ordered the lab, just please make her an appt time.  Thanks.

## 2014-02-20 NOTE — Telephone Encounter (Signed)
Pt called requesting Crestor refill.  Review of chart Crestor is not on current medication list.  Please advise refill.

## 2014-02-23 ENCOUNTER — Other Ambulatory Visit: Payer: Self-pay | Admitting: *Deleted

## 2014-02-23 MED ORDER — SYNTHROID 75 MCG PO TABS: 75.0000 ug | ORAL_TABLET | Freq: Every day | ORAL | Status: DC

## 2014-03-08 ENCOUNTER — Telehealth: Payer: Self-pay | Admitting: Internal Medicine

## 2014-03-08 DIAGNOSIS — E041 Nontoxic single thyroid nodule: Secondary | ICD-10-CM

## 2014-03-08 NOTE — Telephone Encounter (Signed)
Order placed for endocrinology referral.  Pt notified of thyroid ultrasound.  82mm nodule and residual thyroid tissue in pt with history of iodine ablation.

## 2014-03-14 ENCOUNTER — Encounter: Payer: Self-pay | Admitting: Internal Medicine

## 2014-03-14 ENCOUNTER — Ambulatory Visit (INDEPENDENT_AMBULATORY_CARE_PROVIDER_SITE_OTHER): Payer: Medicare Other | Admitting: Internal Medicine

## 2014-03-14 VITALS — BP 114/62 | HR 78 | Temp 97.9°F | Wt 139.0 lb

## 2014-03-14 DIAGNOSIS — W57XXXA Bitten or stung by nonvenomous insect and other nonvenomous arthropods, initial encounter: Secondary | ICD-10-CM | POA: Diagnosis not present

## 2014-03-14 DIAGNOSIS — S80862A Insect bite (nonvenomous), left lower leg, initial encounter: Secondary | ICD-10-CM

## 2014-03-14 DIAGNOSIS — A6920 Lyme disease, unspecified: Secondary | ICD-10-CM

## 2014-03-14 DIAGNOSIS — S90569A Insect bite (nonvenomous), unspecified ankle, initial encounter: Secondary | ICD-10-CM | POA: Diagnosis not present

## 2014-03-14 MED ORDER — DOXYCYCLINE HYCLATE 100 MG PO TABS: 100.0000 mg | ORAL_TABLET | Freq: Two times a day (BID) | ORAL | Status: DC

## 2014-03-14 NOTE — Progress Notes (Signed)
Subjective:    Patient ID: Shannon Mckinney, female    DOB: 1929/03/02, 80 y.o.   MRN: [id number]  HPI  Pt presents to the clinic today to have a place on the back of her left leg checked. She reports that she noticed this about 1 week ago. The area is very itchy. She has not had any injury to the area that she is aware of. She has been walking her dogs outside more frequently. She has tried neosporin and calamine with some relief.  Review of Systems      Past Medical History  Diagnosis Date  . Diabetes mellitus     diet controlled  . Hypercholesterolemia   . History of abnormal Pap smear   . Hyperthyroidism     s/p ablation  . Fibrocystic breast disease   . Benign head tremor     Current Outpatient Prescriptions  Medication Sig Dispense Refill  . Cholecalciferol (VITAMIN D-3) 1000 UNITS CAPS Take 1 capsule by mouth daily.      Marland Kitchen nystatin cream (MYCOSTATIN) Apply 1 application topically 2 (two) times daily.  30 g  0  . rosuvastatin (CRESTOR) 10 MG tablet Take 1 tablet (10 mg total) by mouth daily.  30 tablet  2  . SYNTHROID 75 MCG tablet Take 1 tablet (75 mcg total) by mouth daily before breakfast.  90 tablet  3   No current facility-administered medications for this visit.    Allergies  Allergen Reactions  . Mysoline [Primidone]   . Penicillins     Family History  Problem Relation Age of Onset  . Heart disease Father     myocardial infarction - died 57  . Diabetes Father   . Heart disease Mother     myocardial infarction  . Heart disease Brother     x2  . Diabetes Brother     x2  . Diabetes Sister   . Breast cancer Neg Hx   . Colon cancer Neg Hx     History   Social History  . Marital Status: Married    Spouse Name: N/A    Number of Children: 2  . Years of Education: N/A   Occupational History  . Not on file.   Social History Main Topics  . Smoking status: Never Smoker   . Smokeless tobacco: Never Used  . Alcohol Use: Yes     Comment: occasional  .  Drug Use: No  . Sexual Activity: Not on file   Other Topics Concern  . Not on file   Social History Narrative  . No narrative on file     Constitutional: Denies fever, malaise, fatigue, headache or abrupt weight changes.  Skin: Pt reports an itchy spot on the back of her left leg. Denies lesions or ulcercations.    No other specific complaints in a complete review of systems (except as listed in HPI above).  Objective:   Physical Exam   BP 114/62  Pulse 78  Temp(Src) 97.9 F (36.6 C) (Oral)  Wt 139 lb (63.05 kg)  SpO2 98% Wt Readings from Last 3 Encounters:  03/14/14 139 lb (63.05 kg)  01/31/14 138 lb 8 oz (62.823 kg)  06/16/13 144 lb 4 oz (65.431 kg)    General: Appears her stated age, well developed, well nourished in NAD. Skin: Baseball size erythema migrans noted on the back of her left leg. Nontender to touch. No cellulitis noted. Cardiovascular: Normal rate and rhythm. S1,S2 noted.  No murmur, rubs or  gallops noted. No JVD or BLE edema. No carotid bruits noted. Pulmonary/Chest: Normal effort and positive vesicular breath sounds. No respiratory distress. No wheezes, rales or ronchi noted.    BMET    Component Value Date/Time   NA 138 02/02/2014 1107   K 4.3 02/02/2014 1107   CL 100 02/02/2014 1107   CO2 31 02/02/2014 1107   GLUCOSE 115* 02/02/2014 1107   BUN 15 02/02/2014 1107   CREATININE 0.8 02/02/2014 1107   CALCIUM 9.5 02/02/2014 1107    Lipid Panel     Component Value Date/Time   CHOL 292* 02/02/2014 1107   TRIG 147.0 02/02/2014 1107   HDL 62.40 02/02/2014 1107   CHOLHDL 5 02/02/2014 1107   VLDL 29.4 02/02/2014 1107   LDLCALC 200* 02/02/2014 1107    CBC    Component Value Date/Time   WBC 6.4 08/17/2012 0904   RBC 4.53 08/17/2012 0904   HGB 13.5 08/17/2012 0904   HCT 40.7 08/17/2012 0904   PLT 180.0 08/17/2012 0904   MCV 89.8 08/17/2012 0904   MCHC 33.2 08/17/2012 0904   RDW 14.0 08/17/2012 0904   LYMPHSABS 1.7 08/17/2012 0904   MONOABS 0.4 08/17/2012 0904   EOSABS 0.2  08/17/2012 0904   BASOSABS 0.0 08/17/2012 0904    Hgb A1C Lab Results  Component Value Date   HGBA1C 6.4 02/02/2014        Assessment & Plan:   Erythema Migrans secondary to possible tick bite:  Will treat with doxycycline BID x 10 days Watch for increased redness, warmth or tenderness Call me if you develop a rash all over  RTC as needed or if symptoms persist or worsen

## 2014-03-14 NOTE — Progress Notes (Signed)
Pre visit review using our clinic review tool, if applicable. No additional management support is needed unless otherwise documented below in the visit note. 

## 2014-03-14 NOTE — Patient Instructions (Addendum)
Tick Bite Information Ticks are insects that attach themselves to the skin and draw blood for food. There are various types of ticks. Common types include wood ticks and deer ticks. Most ticks live in shrubs and grassy areas. Ticks can climb onto your body when you make contact with leaves or grass where the tick is waiting. The most common places on the body for ticks to attach themselves are the scalp, neck, armpits, waist, and groin. Most tick bites are harmless, but sometimes ticks carry germs that cause diseases. These germs can be spread to a person during the tick's feeding process. The chance of a disease spreading through a tick bite depends on:   The type of tick.  Time of year.   How long the tick is attached.   Geographic location.  HOW CAN YOU PREVENT TICK BITES? Take these steps to help prevent tick bites when you are outdoors:  Wear protective clothing. Long sleeves and long pants are best.   Wear white clothes so you can see ticks more easily.  Tuck your pant legs into your socks.   If walking on a trail, stay in the middle of the trail to avoid brushing against bushes.  Avoid walking through areas with long grass.  Put insect repellent on all exposed skin and along boot tops, pant legs, and sleeve cuffs.   Check clothing, hair, and skin repeatedly and before going inside.   Brush off any ticks that are not attached.  Take a shower or bath as soon as possible after being outdoors.  WHAT IS THE PROPER WAY TO REMOVE A TICK? Ticks should be removed as soon as possible to help prevent diseases caused by tick bites. 1. If latex gloves are available, put them on before trying to remove a tick.  2. Using fine-point tweezers, grasp the tick as close to the skin as possible. You may also use curved forceps or a tick removal tool. Grasp the tick as close to its head as possible. Avoid grasping the tick on its body. 3. Pull gently with steady upward pressure until  the tick lets go. Do not twist the tick or jerk it suddenly. This may break off the tick's head or mouth parts. 4. Do not squeeze or crush the tick's body. This could force disease-carrying fluids from the tick into your body.  5. After the tick is removed, wash the bite area and your hands with soap and water or other disinfectant such as alcohol. 6. Apply a small amount of antiseptic cream or ointment to the bite site.  7. Wash and disinfect any instruments that were used.  Do not try to remove a tick by applying a hot match, petroleum jelly, or fingernail polish to the tick. These methods do not work and may increase the chances of disease being spread from the tick bite.  WHEN SHOULD YOU SEEK MEDICAL CARE? Contact your health care provider if you are unable to remove a tick from your skin or if a part of the tick breaks off and is stuck in the skin.  After a tick bite, you need to be aware of signs and symptoms that could be related to diseases spread by ticks. Contact your health care provider if you develop any of the following in the days or weeks after the tick bite:  Unexplained fever.  Rash. A circular rash that appears days or weeks after the tick bite may indicate the possibility of Lyme disease. The rash may resemble   a target with a bull's-eye and may occur at a different part of your body than the tick bite.  Redness and swelling in the area of the tick bite.   Tender, swollen lymph glands.   Diarrhea.   Weight loss.   Cough.   Fatigue.   Muscle, joint, or bone pain.   Abdominal pain.   Headache.   Lethargy or a change in your level of consciousness.  Difficulty walking or moving your legs.   Numbness in the legs.   Paralysis.  Shortness of breath.   Confusion.   Repeated vomiting.  Document Released: 07/11/2000 Document Revised: 05/04/2013 Document Reviewed: 12/22/2012 [employer] Patient Information 2015 [employer]. This information is  not intended to replace advice given to you by your health care provider. Make sure you discuss any questions you have with your health care provider.  

## 2014-03-20 ENCOUNTER — Other Ambulatory Visit: Payer: BLUE CROSS/BLUE SHIELD

## 2014-03-24 ENCOUNTER — Telehealth: Payer: Self-pay

## 2014-03-24 NOTE — Telephone Encounter (Signed)
Left message on voicemail.

## 2014-03-24 NOTE — Telephone Encounter (Signed)
Continue to monitor for now. If redness worsen or expands, call back Monday and will refill abx

## 2014-03-24 NOTE — Telephone Encounter (Signed)
Pt left v/m; pt was seen 03/14/14 and pt has finished antibiotic;pt had tick bite and not as inflamed as when seen but still red and itching badly. Pt wants to know if should have antibiotic refilled or what to do.pt request cb.

## 2014-03-28 ENCOUNTER — Other Ambulatory Visit: Payer: Self-pay | Admitting: Internal Medicine

## 2014-03-28 ENCOUNTER — Other Ambulatory Visit (INDEPENDENT_AMBULATORY_CARE_PROVIDER_SITE_OTHER): Payer: Medicare Other

## 2014-03-28 ENCOUNTER — Encounter: Payer: Self-pay | Admitting: Internal Medicine

## 2014-03-28 DIAGNOSIS — E7800 Pure hypercholesterolemia, unspecified: Secondary | ICD-10-CM

## 2014-03-28 DIAGNOSIS — E0590 Thyrotoxicosis, unspecified without thyrotoxic crisis or storm: Secondary | ICD-10-CM

## 2014-03-28 DIAGNOSIS — E119 Type 2 diabetes mellitus without complications: Secondary | ICD-10-CM

## 2014-03-28 LAB — HEPATIC FUNCTION PANEL
ALK PHOS: 55 U/L (ref 39–117)
ALT: 13 U/L (ref 0–35)
AST: 14 U/L (ref 0–37)
Albumin: 3.9 g/dL (ref 3.5–5.2)
Bilirubin, Direct: 0.2 mg/dL (ref 0.0–0.3)
Total Bilirubin: 1.1 mg/dL (ref 0.2–1.2)
Total Protein: 6.7 g/dL (ref 6.0–8.3)

## 2014-03-28 NOTE — Progress Notes (Signed)
Orders placed for labs

## 2014-03-29 ENCOUNTER — Encounter: Payer: Self-pay | Admitting: *Deleted

## 2014-03-30 NOTE — Telephone Encounter (Signed)
Mailed unread message to pt  

## 2014-03-31 ENCOUNTER — Ambulatory Visit (INDEPENDENT_AMBULATORY_CARE_PROVIDER_SITE_OTHER): Payer: Medicare Other | Admitting: Internal Medicine

## 2014-03-31 ENCOUNTER — Encounter: Payer: Self-pay | Admitting: Internal Medicine

## 2014-03-31 ENCOUNTER — Telehealth: Payer: Self-pay

## 2014-03-31 VITALS — BP 120/70 | HR 69 | Temp 98.5°F | Ht 63.5 in | Wt 140.0 lb

## 2014-03-31 DIAGNOSIS — R21 Rash and other nonspecific skin eruption: Secondary | ICD-10-CM

## 2014-03-31 MED ORDER — TRIAMCINOLONE ACETONIDE 0.1 % EX CREA: 1.0000 "application " | TOPICAL_CREAM | Freq: Two times a day (BID) | CUTANEOUS | Status: DC

## 2014-03-31 MED ORDER — DOXYCYCLINE HYCLATE 100 MG PO TABS: 100.0000 mg | ORAL_TABLET | Freq: Two times a day (BID) | ORAL | Status: DC

## 2014-03-31 NOTE — Patient Instructions (Signed)
Take a probiotic daily

## 2014-03-31 NOTE — Progress Notes (Signed)
Pre visit review using our clinic review tool, if applicable. No additional management support is needed unless otherwise documented below in the visit note. 

## 2014-03-31 NOTE — Telephone Encounter (Signed)
Pt left v/m; pt was seen by Webb Silversmith NP on 03/14/14 and given antibiotic for tick bite. Pt has finished antibiotic and pt continues with red area around tick bite and itching a lot. Pt wants to know if needs refill of antibiotic or what to do. Webb Silversmith NP is out of office and not on computer; note sent to Dr Wilfred Curtis.Please advise. Pt request cb.

## 2014-03-31 NOTE — Telephone Encounter (Signed)
Please see if she can come in today at 4:00 for work in.

## 2014-03-31 NOTE — Telephone Encounter (Signed)
Pt notified & scheduled.

## 2014-04-03 ENCOUNTER — Encounter: Payer: Self-pay | Admitting: Internal Medicine

## 2014-04-03 DIAGNOSIS — R21 Rash and other nonspecific skin eruption: Secondary | ICD-10-CM | POA: Insufficient documentation

## 2014-04-03 NOTE — Assessment & Plan Note (Signed)
Localized to upper leg.  No other rash.  No headache, fever or body aches.  Some increased itching.  Question of local reaction from bite, etc.  Will extend doxycycline x 1 more week.  Apply triamcinolone cream as directed.  Follow.  Call with update over the next several days.  Follow.

## 2014-04-03 NOTE — Progress Notes (Signed)
  Subjective:    Patient ID: Shannon Mckinney, female    DOB: 12/17/1928, 80 y.o.   MRN: [id number]  HPI 80 year old female with past history of hyperthyroidism s/p ablation now maintained on synthroid, hypercholesterolemia and FCD.  She comes in today as a work in with concerns regarding a persistent rash noted left upper leg.  States she was seen at [facility] one week ago.  Was concern regarding a possible tick bite.  Was placed on doxycycline.  Took the whole prescription.  Some minimal improvement.  Comes in today with persistent red rash - upper leg.  Noticed increased itching and previously some heat.  Does not know what bit her.  No swelling.  No other rash.  No fever.  No headache.  No body aches.     Past Medical History  Diagnosis Date  . Diabetes mellitus     diet controlled  . Hypercholesterolemia   . History of abnormal Pap smear   . Hyperthyroidism     s/p ablation  . Fibrocystic breast disease   . Benign head tremor     Current Outpatient Prescriptions on File Prior to Visit  Medication Sig Dispense Refill  . rosuvastatin (CRESTOR) 10 MG tablet Take 1 tablet (10 mg total) by mouth daily.  30 tablet  2  . SYNTHROID 75 MCG tablet Take 1 tablet (75 mcg total) by mouth daily before breakfast.  90 tablet  3  . Cholecalciferol (VITAMIN D-3) 1000 UNITS CAPS Take 1 capsule by mouth daily.       No current facility-administered medications on file prior to visit.    Review of Systems Patient denies any headache, lightheadedness or dizziness.  No fever.  No sinus or allergy symptoms.  No chest pain, tightness or palpitations.  No increased shortness of breath, cough or congestion.  No nausea or vomiting.  No abdominal pain or cramping.  No bowel change, such as diarrhea.  Rash as outlined.  No other rash.  No fever.  No body aches.         Objective:   Physical Exam  Filed Vitals:   03/31/14 1559  BP: 120/70  Pulse: 69  Temp: 98.5 F (62.73 C)   80 year old female in  no acute distress.   EXTREMITIES:  No increased edema present.   SKIN:  Erythematous rash noticed upper leg.  No other rash.          Assessment & Plan:  HEALTH MAINTENANCE.  Physical last visit..  Mammogram 08/10/13 -Birads I.  Has declined GI evaluation.

## 2014-04-15 DIAGNOSIS — H2510 Age-related nuclear cataract, unspecified eye: Secondary | ICD-10-CM | POA: Diagnosis not present

## 2014-04-24 DIAGNOSIS — Z23 Encounter for immunization: Secondary | ICD-10-CM | POA: Diagnosis not present

## 2014-04-27 DIAGNOSIS — E890 Postprocedural hypothyroidism: Secondary | ICD-10-CM | POA: Insufficient documentation

## 2014-04-27 DIAGNOSIS — E041 Nontoxic single thyroid nodule: Secondary | ICD-10-CM | POA: Diagnosis not present

## 2014-04-30 ENCOUNTER — Encounter: Payer: Self-pay | Admitting: Internal Medicine

## 2014-05-10 ENCOUNTER — Encounter: Payer: Self-pay | Admitting: Internal Medicine

## 2014-05-25 ENCOUNTER — Telehealth: Payer: Self-pay | Admitting: Internal Medicine

## 2014-05-25 NOTE — Telephone Encounter (Signed)
Opened in error

## 2014-06-16 ENCOUNTER — Encounter: Payer: Self-pay | Admitting: Internal Medicine

## 2014-06-28 ENCOUNTER — Other Ambulatory Visit: Payer: Medicare Other

## 2014-08-03 ENCOUNTER — Ambulatory Visit (INDEPENDENT_AMBULATORY_CARE_PROVIDER_SITE_OTHER)
Admission: RE | Admit: 2014-08-03 | Discharge: 2014-08-03 | Disposition: A | Discharge status: Home / Self Care | Payer: Medicare Other | Source: Ambulatory Visit | Attending: Internal Medicine | Admitting: Internal Medicine

## 2014-08-03 ENCOUNTER — Ambulatory Visit (INDEPENDENT_AMBULATORY_CARE_PROVIDER_SITE_OTHER): Payer: Medicare Other | Admitting: Internal Medicine

## 2014-08-03 ENCOUNTER — Encounter: Payer: Self-pay | Admitting: Internal Medicine

## 2014-08-03 VITALS — BP 110/50 | HR 80 | Temp 98.1°F | Ht 63.5 in | Wt 136.2 lb

## 2014-08-03 DIAGNOSIS — M25562 Pain in left knee: Secondary | ICD-10-CM

## 2014-08-03 DIAGNOSIS — E7800 Pure hypercholesterolemia, unspecified: Secondary | ICD-10-CM

## 2014-08-03 DIAGNOSIS — E0590 Thyrotoxicosis, unspecified without thyrotoxic crisis or storm: Secondary | ICD-10-CM

## 2014-08-03 DIAGNOSIS — M8580 Other specified disorders of bone density and structure, unspecified site: Secondary | ICD-10-CM

## 2014-08-03 DIAGNOSIS — E119 Type 2 diabetes mellitus without complications: Secondary | ICD-10-CM

## 2014-08-03 NOTE — Progress Notes (Signed)
Pre visit review using our clinic review tool, if applicable. No additional management support is needed unless otherwise documented below in the visit note. 

## 2014-08-03 NOTE — Patient Instructions (Signed)
Can take ibuprofen 200mg  (1-2) with breakfast and 1-2 with supper.    Tylenol Extra strength - 2 tablets midday.

## 2014-08-04 ENCOUNTER — Encounter: Payer: Self-pay | Admitting: Internal Medicine

## 2014-08-04 DIAGNOSIS — M25569 Pain in unspecified knee: Secondary | ICD-10-CM

## 2014-08-06 ENCOUNTER — Encounter: Payer: Self-pay | Admitting: Internal Medicine

## 2014-08-06 NOTE — Progress Notes (Signed)
Subjective:    Patient ID: Shannon Mckinney, female    DOB: December 31, 1928, 81 y.o.   MRN: [id number]  Knee Pain   81 year old female with past history of hyperthyroidism s/p ablation now maintained on synthroid, hypercholesterolemia and FCD.  She comes in today for a scheduled follow up.  States that overall she is doing well.  No chest pain or tightness.  Breathing stable.  No acid reflux reported.  No nausea or vomiting. No bowel change.  States her biggest complaint is that of left knee pain.  Started between Christmas and New Years.  Increased pain with weight bearing.  No pain with sitting.  Has been taking ibuprofen.  No swelling or redness.  Walking aggravates.  No known injury or trauma.      Past Medical History  Diagnosis Date  . Diabetes mellitus     diet controlled  . Hypercholesterolemia   . History of abnormal Pap smear   . Hyperthyroidism     s/p ablation  . Fibrocystic breast disease   . Benign head tremor     Current Outpatient Prescriptions on File Prior to Visit  Medication Sig Dispense Refill  . Cholecalciferol (VITAMIN D-3) 1000 UNITS CAPS Take 1 capsule by mouth daily.    Marland Kitchen SYNTHROID 75 MCG tablet Take 1 tablet (75 mcg total) by mouth daily before breakfast. 90 tablet 3   No current facility-administered medications on file prior to visit.    Review of Systems Patient denies any headache, lightheadedness or dizziness.  No fever.  No sinus or allergy symptoms.  No chest pain, tightness or palpitations.  No increased shortness of breath, cough or congestion.  No nausea or vomiting.  No abdominal pain or cramping.  No bowel change, such as diarrhea.  Left knee pain as outlined.  No known injury or trauma.  No swelling.  Taking ibuprofen.  Walking and weight bearing - aggravate.         Objective:   Physical Exam  Filed Vitals:   08/03/14 1415  BP: 110/50  Pulse: 80  Temp: 98.1 F (36.7 C)   Blood pressure recheck:  24/79  81 year old female in no acute  distress.   HEENT:  Nares- clear.  Oropharynx - without lesions. NECK:  Supple.  Nontender.  No audible bruit.  HEART:  Appears to be regular. LUNGS:  No crackles or wheezing audible.  Respirations even and unlabored.  RADIAL PULSE:  Equal bilaterally.   ABDOMEN:  Soft, nontender.  Bowel sounds present and normal.  No audible abdominal bruit.  EXTREMITIES:  No increased edema present.  DP pulses palpable and equal bilaterally.      MSK:  No pain to palpation over the left knee.  No increased soft tissue swelling (maybe minimal - anterior/laterally).  No increased redness.  No pain with flexion.  Some increased pain with full extension.  No pain with resistance against flexion and extension.  No instability of the knee joint.         Assessment & Plan:  1. Left knee pain Persistent knee pain.  Worse with walking and weight bearing.  Tylenol and ibuprofen alternating as directed.   - DG Knee 1-2 Views Left; Future  2. Type 2 diabetes mellitus without complication Low carb diet.  Follow met b and a1c.  Lab Results  Component Value Date   HGBA1C 6.4 02/02/2014   3. Hyperthyroidism S/p ablation.  Now maintained on synthroid.  Follow tsh.  4. Osteopenia Weight bearing exercise.  Check vitamin D level.    5. Hypercholesterolemia Low cholesterol diet and exercise.  Follow lipid panel.   Lab Results  Component Value Date   CHOL 292* 02/02/2014   HDL 62.40 02/02/2014   LDLCALC 200* 02/02/2014   LDLDIRECT 130.6 06/22/2013   TRIG 147.0 02/02/2014   CHOLHDL 5 02/02/2014   HEALTH MAINTENANCE.  Physical 01/31/14..  Mammogram 08/10/13 -Birads I.  Discussed with her regarding scheduling mammogram for 2016.  She declines.  Wants to skip this year.  Has declined GI evaluation.

## 2014-08-07 NOTE — Telephone Encounter (Signed)
Unread mychart message mailed to patient 

## 2014-08-08 NOTE — Telephone Encounter (Signed)
Order placed for physical therapy.  

## 2014-08-08 NOTE — Addendum Note (Signed)
Addended by: Alisa Graff on: 08/08/2014 05:16 AM   Modules accepted: Orders

## 2014-08-10 ENCOUNTER — Other Ambulatory Visit (INDEPENDENT_AMBULATORY_CARE_PROVIDER_SITE_OTHER): Payer: Medicare Other

## 2014-08-10 DIAGNOSIS — E0590 Thyrotoxicosis, unspecified without thyrotoxic crisis or storm: Secondary | ICD-10-CM

## 2014-08-10 DIAGNOSIS — M25562 Pain in left knee: Secondary | ICD-10-CM | POA: Diagnosis not present

## 2014-08-10 DIAGNOSIS — E7800 Pure hypercholesterolemia, unspecified: Secondary | ICD-10-CM

## 2014-08-10 DIAGNOSIS — M6281 Muscle weakness (generalized): Secondary | ICD-10-CM | POA: Diagnosis not present

## 2014-08-10 DIAGNOSIS — E119 Type 2 diabetes mellitus without complications: Secondary | ICD-10-CM | POA: Diagnosis not present

## 2014-08-10 DIAGNOSIS — M25662 Stiffness of left knee, not elsewhere classified: Secondary | ICD-10-CM | POA: Diagnosis not present

## 2014-08-10 LAB — HEPATIC FUNCTION PANEL
ALBUMIN: 4.3 g/dL (ref 3.5–5.2)
ALK PHOS: 63 U/L (ref 39–117)
ALT: 8 U/L (ref 0–35)
AST: 13 U/L (ref 0–37)
BILIRUBIN DIRECT: 0.1 mg/dL (ref 0.0–0.3)
BILIRUBIN TOTAL: 1.2 mg/dL (ref 0.2–1.2)
Total Protein: 7.5 g/dL (ref 6.0–8.3)

## 2014-08-10 LAB — CBC WITH DIFFERENTIAL/PLATELET
BASOS ABS: 0 10*3/uL (ref 0.0–0.1)
Basophils Relative: 0.4 % (ref 0.0–3.0)
EOS PCT: 1.9 % (ref 0.0–5.0)
Eosinophils Absolute: 0.2 10*3/uL (ref 0.0–0.7)
HCT: 40.5 % (ref 36.0–46.0)
Hemoglobin: 13.4 g/dL (ref 12.0–15.0)
LYMPHS ABS: 1.8 10*3/uL (ref 0.7–4.0)
Lymphocytes Relative: 17.9 % (ref 12.0–46.0)
MCHC: 33.1 g/dL (ref 30.0–36.0)
MCV: 90.5 fl (ref 78.0–100.0)
MONO ABS: 0.9 10*3/uL (ref 0.1–1.0)
Monocytes Relative: 9.1 % (ref 3.0–12.0)
NEUTROS PCT: 70.7 % (ref 43.0–77.0)
Neutro Abs: 7.2 10*3/uL (ref 1.4–7.7)
PLATELETS: 205 10*3/uL (ref 150.0–400.0)
RBC: 4.47 Mil/uL (ref 3.87–5.11)
RDW: 13.7 % (ref 11.5–15.5)
WBC: 10.1 10*3/uL (ref 4.0–10.5)

## 2014-08-10 LAB — BASIC METABOLIC PANEL
BUN: 15 mg/dL (ref 6–23)
CO2: 31 meq/L (ref 19–32)
Calcium: 9.4 mg/dL (ref 8.4–10.5)
Chloride: 99 mEq/L (ref 96–112)
Creatinine, Ser: 0.78 mg/dL (ref 0.40–1.20)
GFR: 74.84 mL/min (ref >60.00)
GLUCOSE: 118 mg/dL — AB (ref 70–99)
Potassium: 3.9 mEq/L (ref 3.5–5.1)
Sodium: 139 mEq/L (ref 135–145)

## 2014-08-10 LAB — LIPID PANEL
Cholesterol: 266 mg/dL — ABNORMAL HIGH (ref 0–200)
HDL: 69.6 mg/dL (ref >39.00)
LDL Cholesterol: 175 mg/dL — ABNORMAL HIGH (ref 0–99)
NonHDL: 196.4
Total CHOL/HDL Ratio: 4
Triglycerides: 108 mg/dL (ref 0.0–149.0)
VLDL: 21.6 mg/dL (ref 0.0–40.0)

## 2014-08-10 LAB — HEMOGLOBIN A1C: HEMOGLOBIN A1C: 6.6 % — AB (ref 4.6–6.5)

## 2014-08-14 DIAGNOSIS — M25662 Stiffness of left knee, not elsewhere classified: Secondary | ICD-10-CM | POA: Diagnosis not present

## 2014-08-14 DIAGNOSIS — M25562 Pain in left knee: Secondary | ICD-10-CM | POA: Diagnosis not present

## 2014-08-14 DIAGNOSIS — M6281 Muscle weakness (generalized): Secondary | ICD-10-CM | POA: Diagnosis not present

## 2014-08-16 DIAGNOSIS — M25662 Stiffness of left knee, not elsewhere classified: Secondary | ICD-10-CM | POA: Diagnosis not present

## 2014-08-16 DIAGNOSIS — M25562 Pain in left knee: Secondary | ICD-10-CM | POA: Diagnosis not present

## 2014-08-16 DIAGNOSIS — M6281 Muscle weakness (generalized): Secondary | ICD-10-CM | POA: Diagnosis not present

## 2014-08-21 DIAGNOSIS — M25662 Stiffness of left knee, not elsewhere classified: Secondary | ICD-10-CM | POA: Diagnosis not present

## 2014-08-21 DIAGNOSIS — M6281 Muscle weakness (generalized): Secondary | ICD-10-CM | POA: Diagnosis not present

## 2014-08-21 DIAGNOSIS — M25562 Pain in left knee: Secondary | ICD-10-CM | POA: Diagnosis not present

## 2014-08-23 DIAGNOSIS — M25562 Pain in left knee: Secondary | ICD-10-CM | POA: Diagnosis not present

## 2014-08-23 DIAGNOSIS — M25662 Stiffness of left knee, not elsewhere classified: Secondary | ICD-10-CM | POA: Diagnosis not present

## 2014-08-23 DIAGNOSIS — M6281 Muscle weakness (generalized): Secondary | ICD-10-CM | POA: Diagnosis not present

## 2014-08-28 DIAGNOSIS — M6281 Muscle weakness (generalized): Secondary | ICD-10-CM | POA: Diagnosis not present

## 2014-08-28 DIAGNOSIS — M25662 Stiffness of left knee, not elsewhere classified: Secondary | ICD-10-CM | POA: Diagnosis not present

## 2014-08-28 DIAGNOSIS — M25562 Pain in left knee: Secondary | ICD-10-CM | POA: Diagnosis not present

## 2014-08-30 DIAGNOSIS — M6281 Muscle weakness (generalized): Secondary | ICD-10-CM | POA: Diagnosis not present

## 2014-08-30 DIAGNOSIS — M25662 Stiffness of left knee, not elsewhere classified: Secondary | ICD-10-CM | POA: Diagnosis not present

## 2014-08-30 DIAGNOSIS — M25562 Pain in left knee: Secondary | ICD-10-CM | POA: Diagnosis not present

## 2014-09-04 DIAGNOSIS — M25562 Pain in left knee: Secondary | ICD-10-CM | POA: Diagnosis not present

## 2014-09-04 DIAGNOSIS — M25662 Stiffness of left knee, not elsewhere classified: Secondary | ICD-10-CM | POA: Diagnosis not present

## 2014-09-04 DIAGNOSIS — M6281 Muscle weakness (generalized): Secondary | ICD-10-CM | POA: Diagnosis not present

## 2014-09-06 DIAGNOSIS — M6281 Muscle weakness (generalized): Secondary | ICD-10-CM | POA: Diagnosis not present

## 2014-09-06 DIAGNOSIS — M25562 Pain in left knee: Secondary | ICD-10-CM | POA: Diagnosis not present

## 2014-09-06 DIAGNOSIS — M25662 Stiffness of left knee, not elsewhere classified: Secondary | ICD-10-CM | POA: Diagnosis not present

## 2014-09-14 DIAGNOSIS — M25562 Pain in left knee: Secondary | ICD-10-CM | POA: Diagnosis not present

## 2014-09-14 DIAGNOSIS — M6281 Muscle weakness (generalized): Secondary | ICD-10-CM | POA: Diagnosis not present

## 2014-09-14 DIAGNOSIS — M25662 Stiffness of left knee, not elsewhere classified: Secondary | ICD-10-CM | POA: Diagnosis not present

## 2014-09-18 DIAGNOSIS — M25662 Stiffness of left knee, not elsewhere classified: Secondary | ICD-10-CM | POA: Diagnosis not present

## 2014-09-18 DIAGNOSIS — M6281 Muscle weakness (generalized): Secondary | ICD-10-CM | POA: Diagnosis not present

## 2014-09-18 DIAGNOSIS — M25562 Pain in left knee: Secondary | ICD-10-CM | POA: Diagnosis not present

## 2014-09-25 DIAGNOSIS — M25562 Pain in left knee: Secondary | ICD-10-CM | POA: Diagnosis not present

## 2014-09-25 DIAGNOSIS — M6281 Muscle weakness (generalized): Secondary | ICD-10-CM | POA: Diagnosis not present

## 2014-09-25 DIAGNOSIS — M25662 Stiffness of left knee, not elsewhere classified: Secondary | ICD-10-CM | POA: Diagnosis not present

## 2014-09-26 ENCOUNTER — Other Ambulatory Visit: Payer: Self-pay | Admitting: Internal Medicine

## 2014-10-25 DIAGNOSIS — H6123 Impacted cerumen, bilateral: Secondary | ICD-10-CM | POA: Diagnosis not present

## 2015-01-02 DIAGNOSIS — D1800 Hemangioma unspecified site: Secondary | ICD-10-CM | POA: Diagnosis not present

## 2015-01-02 DIAGNOSIS — Z85828 Personal history of other malignant neoplasm of skin: Secondary | ICD-10-CM | POA: Diagnosis not present

## 2015-01-02 DIAGNOSIS — Z1283 Encounter for screening for malignant neoplasm of skin: Secondary | ICD-10-CM | POA: Diagnosis not present

## 2015-01-02 DIAGNOSIS — L821 Other seborrheic keratosis: Secondary | ICD-10-CM | POA: Diagnosis not present

## 2015-01-02 DIAGNOSIS — D229 Melanocytic nevi, unspecified: Secondary | ICD-10-CM | POA: Diagnosis not present

## 2015-01-02 DIAGNOSIS — L814 Other melanin hyperpigmentation: Secondary | ICD-10-CM | POA: Diagnosis not present

## 2015-02-15 ENCOUNTER — Ambulatory Visit (INDEPENDENT_AMBULATORY_CARE_PROVIDER_SITE_OTHER): Payer: Medicare Other | Admitting: Internal Medicine

## 2015-02-15 ENCOUNTER — Encounter: Payer: Self-pay | Admitting: Internal Medicine

## 2015-02-15 VITALS — BP 116/70 | HR 81 | Temp 98.3°F | Ht 63.5 in | Wt 137.1 lb

## 2015-02-15 DIAGNOSIS — M8580 Other specified disorders of bone density and structure, unspecified site: Secondary | ICD-10-CM

## 2015-02-15 DIAGNOSIS — E0590 Thyrotoxicosis, unspecified without thyrotoxic crisis or storm: Secondary | ICD-10-CM

## 2015-02-15 DIAGNOSIS — R221 Localized swelling, mass and lump, neck: Secondary | ICD-10-CM

## 2015-02-15 DIAGNOSIS — M25562 Pain in left knee: Secondary | ICD-10-CM

## 2015-02-15 DIAGNOSIS — E119 Type 2 diabetes mellitus without complications: Secondary | ICD-10-CM | POA: Diagnosis not present

## 2015-02-15 DIAGNOSIS — Z0000 Encounter for general adult medical examination without abnormal findings: Secondary | ICD-10-CM

## 2015-02-15 DIAGNOSIS — E7800 Pure hypercholesterolemia, unspecified: Secondary | ICD-10-CM

## 2015-02-15 NOTE — Progress Notes (Signed)
Pre visit review using our clinic review tool, if applicable. No additional management support is needed unless otherwise documented below in the visit note. 

## 2015-02-15 NOTE — Progress Notes (Signed)
Patient ID: Shannon Mckinney, female   DOB: [birth year], 81 y.o.   MRN: [id number]   Subjective:    Patient ID: Shannon Mckinney, female    DOB: 12/23/1928, 81 y.o.   MRN: [id number]  HPI  Patient here for a scheduled follow up.  Reports persistent pain in her left knee.  Limits her activity at times.  No cardiac symptoms with increased activity or exertion.  No sob.  Eating and drinking well.  No acid reflux.  Bowels stable.  Discussed mammogram.  She declines.    Past Medical History  Diagnosis Date  . Diabetes mellitus     diet controlled  . Hypercholesterolemia   . History of abnormal Pap smear   . Hyperthyroidism     s/p ablation  . Fibrocystic breast disease   . Benign head tremor     Outpatient Encounter Prescriptions as of 02/15/2015  Medication Sig  . Cholecalciferol (VITAMIN D-3) 1000 UNITS CAPS Take 1 capsule by mouth daily.  Marland Kitchen SYNTHROID 75 MCG tablet TAKE 1 TABLET (75 MCG TOTAL) BY MOUTH DAILY BEFORE BREAKFAST.  . [DISCONTINUED] SYNTHROID 75 MCG tablet Take 1 tablet (75 mcg total) by mouth daily before breakfast.   No facility-administered encounter medications on file as of 02/15/2015.    Review of Systems  Constitutional: Negative for appetite change and unexpected weight change.  HENT: Negative for congestion and sinus pressure.   Respiratory: Negative for cough, chest tightness and shortness of breath.   Cardiovascular: Negative for chest pain, palpitations and leg swelling.  Gastrointestinal: Negative for nausea, vomiting, abdominal pain and diarrhea.  Musculoskeletal:       Left knee pain as outlined.    Skin: Negative for color change and rash.  Neurological: Negative for dizziness, light-headedness and headaches.  Psychiatric/Behavioral: Negative for dysphoric mood and agitation.       Objective:     Blood pressure recheck:  118/68  Physical Exam  Constitutional: She appears well-developed and well-nourished. No distress.  HENT:  Nose: Nose normal.    Mouth/Throat: Oropharynx is clear and moist.  Neck: Neck supple. No thyromegaly present.  Cardiovascular: Normal rate and regular rhythm.   Pulmonary/Chest: Breath sounds normal. No respiratory distress. She has no wheezes.  Abdominal: Soft. Bowel sounds are normal. There is no tenderness.  Musculoskeletal: She exhibits no edema or tenderness.  Some increased discomfort - with resistance against flexion and extension.   Lymphadenopathy:    She has no cervical adenopathy.  Skin: No rash noted. No erythema.  Psychiatric: She has a normal mood and affect. Her behavior is normal.    BP 116/70 mmHg  Pulse 81  Temp(Src) 98.3 F (36.8 C) (Oral)  Ht 5' 3.5" (1.613 m)  Wt 137 lb 2 oz (62.199 kg)  BMI 23.91 kg/m2  SpO2 97% Wt Readings from Last 3 Encounters:  02/15/15 137 lb 2 oz (62.199 kg)  08/03/14 136 lb 4 oz (61.803 kg)  03/31/14 140 lb (63.504 kg)     Lab Results  Component Value Date   WBC 10.1 08/10/2014   HGB 13.4 08/10/2014   HCT 40.5 08/10/2014   PLT 205.0 08/10/2014   GLUCOSE 118* 08/10/2014   CHOL 266* 08/10/2014   TRIG 108.0 08/10/2014   HDL 69.60 08/10/2014   LDLDIRECT 130.6 06/22/2013   LDLCALC 175* 08/10/2014   ALT 8 08/10/2014   AST 13 08/10/2014   NA 139 08/10/2014   K 3.9 08/10/2014   CL 99 08/10/2014   CREATININE 0.78 08/10/2014  BUN 15 08/10/2014   CO2 31 08/10/2014   TSH 2.41 02/02/2014   HGBA1C 6.6* 08/10/2014   MICROALBUR 2.0* 02/02/2014    Dg Knee 1-2 Views Left  08/03/2014   CLINICAL DATA:  Acute left knee pain for 2 weeks without trauma.  EXAM: LEFT KNEE - 1-2 VIEW  COMPARISON:  None.  FINDINGS: There is no evidence of fracture, dislocation, or joint effusion. Joint spaces are intact. Moderate spurring of superior aspect of patella is noted. Soft tissues are unremarkable.  IMPRESSION: No acute abnormality seen in the left knee. Moderate patellar spurring.   Electronically Signed   By: Sabino Dick M.D.   On: 08/03/2014 16:57        Assessment & Plan:   Problem List Items Addressed This Visit    Diabetes mellitus - Primary    Low carb diet and exercise.  Follow met b and a1c.       Relevant Orders   Hepatic function panel   Hemoglobin H9M   Basic metabolic panel   Microalbumin / creatinine urine ratio   Fullness of neck    Evaluated by Dr Eddie Dibbles 04/27/14 - nodule .8cm.  Recommended f/u thyroid ultrasound in 12 months after last check.       Health care maintenance    Schedule her for a physical.  Declines mammogram.       Hypercholesterolemia    Low cholesterol diet and exercise.  Off crestor.  Check lipid panel.  Intolerant to crestor.       Relevant Orders   Lipid panel   Hyperthyroidism    S/p ablation.  On synthroid.  Follow tsh.       Relevant Orders   TSH   Left knee pain    Check xray.  Further w/up pending results.        Osteopenia    Continue vitamin D and weight bearing exercise.  Will notify me when agreeable for bone density.         I spent 25 minutes with the patient and more than 50% of the time was spent in consultation regarding the above.     Einar Pheasant, MD

## 2015-02-17 ENCOUNTER — Encounter: Payer: Self-pay | Admitting: Internal Medicine

## 2015-02-17 DIAGNOSIS — Z0000 Encounter for general adult medical examination without abnormal findings: Secondary | ICD-10-CM | POA: Insufficient documentation

## 2015-02-17 NOTE — Assessment & Plan Note (Signed)
Evaluated by Dr Eddie Dibbles 04/27/14 - nodule .8cm.  Recommended f/u thyroid ultrasound in 12 months after last check.

## 2015-02-17 NOTE — Assessment & Plan Note (Signed)
Schedule her for a physical.  Declines mammogram.

## 2015-02-17 NOTE — Assessment & Plan Note (Signed)
S/p ablation.  On synthroid.  Follow tsh.

## 2015-02-17 NOTE — Assessment & Plan Note (Signed)
Low cholesterol diet and exercise.  Off crestor.  Check lipid panel.  Intolerant to crestor.

## 2015-02-17 NOTE — Assessment & Plan Note (Signed)
Low carb diet and exercise.  Follow met b and a1c.  

## 2015-02-17 NOTE — Assessment & Plan Note (Signed)
Check xray.  Further w/up pending results.

## 2015-02-17 NOTE — Assessment & Plan Note (Signed)
Continue vitamin D and weight bearing exercise.  Will notify me when agreeable for bone density.

## 2015-03-08 ENCOUNTER — Other Ambulatory Visit (INDEPENDENT_AMBULATORY_CARE_PROVIDER_SITE_OTHER): Payer: Medicare Other

## 2015-03-08 DIAGNOSIS — E7800 Pure hypercholesterolemia, unspecified: Secondary | ICD-10-CM

## 2015-03-08 DIAGNOSIS — E119 Type 2 diabetes mellitus without complications: Secondary | ICD-10-CM | POA: Diagnosis not present

## 2015-03-08 DIAGNOSIS — E0590 Thyrotoxicosis, unspecified without thyrotoxic crisis or storm: Secondary | ICD-10-CM

## 2015-03-08 LAB — MICROALBUMIN / CREATININE URINE RATIO
CREATININE, U: 228.2 mg/dL
MICROALB UR: 5.1 mg/dL — AB (ref 0.0–1.9)
Microalb Creat Ratio: 2.2 mg/g (ref 0.0–30.0)

## 2015-03-08 LAB — LIPID PANEL
CHOL/HDL RATIO: 4
CHOLESTEROL: 309 mg/dL — AB (ref 0–200)
HDL: 68.9 mg/dL (ref >39.00)
LDL Cholesterol: 220 mg/dL — ABNORMAL HIGH (ref 0–99)
NonHDL: 240.13
TRIGLYCERIDES: 101 mg/dL (ref 0.0–149.0)
VLDL: 20.2 mg/dL (ref 0.0–40.0)

## 2015-03-08 LAB — BASIC METABOLIC PANEL
BUN: 16 mg/dL (ref 6–23)
CALCIUM: 9.3 mg/dL (ref 8.4–10.5)
CHLORIDE: 100 meq/L (ref 96–112)
CO2: 30 mEq/L (ref 19–32)
CREATININE: 0.73 mg/dL (ref 0.40–1.20)
GFR: 80.67 mL/min (ref >60.00)
Glucose, Bld: 116 mg/dL — ABNORMAL HIGH (ref 70–99)
Potassium: 4 mEq/L (ref 3.5–5.1)
Sodium: 141 mEq/L (ref 135–145)

## 2015-03-08 LAB — HEPATIC FUNCTION PANEL
ALT: 8 U/L (ref 0–35)
AST: 13 U/L (ref 0–37)
Albumin: 4.4 g/dL (ref 3.5–5.2)
Alkaline Phosphatase: 64 U/L (ref 39–117)
Bilirubin, Direct: 0.1 mg/dL (ref 0.0–0.3)
Total Bilirubin: 0.8 mg/dL (ref 0.2–1.2)
Total Protein: 6.9 g/dL (ref 6.0–8.3)

## 2015-03-08 LAB — HEMOGLOBIN A1C: Hgb A1c MFr Bld: 6.3 % (ref 4.6–6.5)

## 2015-03-08 LAB — TSH: TSH: 3.39 u[IU]/mL (ref 0.35–4.50)

## 2015-03-09 ENCOUNTER — Encounter: Payer: Self-pay | Admitting: *Deleted

## 2015-03-13 NOTE — Telephone Encounter (Signed)
Unread mychart message mailed to patient 

## 2015-03-20 ENCOUNTER — Other Ambulatory Visit: Payer: Self-pay | Admitting: Internal Medicine

## 2015-03-20 ENCOUNTER — Telehealth: Payer: Self-pay | Admitting: *Deleted

## 2015-03-20 ENCOUNTER — Other Ambulatory Visit: Payer: Self-pay | Admitting: *Deleted

## 2015-03-20 DIAGNOSIS — E7800 Pure hypercholesterolemia, unspecified: Secondary | ICD-10-CM

## 2015-03-20 MED ORDER — PRAVASTATIN SODIUM 10 MG PO TABS
10.0000 mg | ORAL_TABLET | Freq: Every day | ORAL | Status: DC
Start: 2015-03-20 — End: 2015-08-01

## 2015-03-20 NOTE — Telephone Encounter (Signed)
Pt came into the clinic states she will try the cholesterol medication as recommended by Dr Nicki Reaper.  Please advise Rx

## 2015-03-20 NOTE — Telephone Encounter (Signed)
I had sent pt a my chart message stating that if she had problems with crestor, we could try a different cholesterol medication.  I had asked if she has tried pravastatin.  If she has not tried pravastatin, then I would like to start her on pravastatin '10mg'$  q day.  Will need liver panel checked in 6 weeks after starting.

## 2015-03-20 NOTE — Progress Notes (Signed)
Order placed for f/u liver panel.  

## 2015-03-20 NOTE — Telephone Encounter (Signed)
Spoke with pt, Rx sent and appoint scheduled

## 2015-04-05 DIAGNOSIS — H2513 Age-related nuclear cataract, bilateral: Secondary | ICD-10-CM | POA: Diagnosis not present

## 2015-04-05 LAB — HM DIABETES EYE EXAM

## 2015-04-10 ENCOUNTER — Encounter: Payer: Self-pay | Admitting: Internal Medicine

## 2015-04-25 ENCOUNTER — Other Ambulatory Visit: Payer: Self-pay | Admitting: Internal Medicine

## 2015-05-01 ENCOUNTER — Other Ambulatory Visit (INDEPENDENT_AMBULATORY_CARE_PROVIDER_SITE_OTHER): Payer: Medicare Other

## 2015-05-01 DIAGNOSIS — E7800 Pure hypercholesterolemia, unspecified: Secondary | ICD-10-CM

## 2015-05-01 LAB — HEPATIC FUNCTION PANEL
ALK PHOS: 61 U/L (ref 39–117)
ALT: 10 U/L (ref 0–35)
AST: 16 U/L (ref 0–37)
Albumin: 4.4 g/dL (ref 3.5–5.2)
Bilirubin, Direct: 0.2 mg/dL (ref 0.0–0.3)
TOTAL PROTEIN: 7.1 g/dL (ref 6.0–8.3)
Total Bilirubin: 1 mg/dL (ref 0.2–1.2)

## 2015-05-02 ENCOUNTER — Other Ambulatory Visit: Payer: Self-pay | Admitting: Internal Medicine

## 2015-05-02 DIAGNOSIS — E119 Type 2 diabetes mellitus without complications: Secondary | ICD-10-CM

## 2015-05-02 DIAGNOSIS — E0590 Thyrotoxicosis, unspecified without thyrotoxic crisis or storm: Secondary | ICD-10-CM

## 2015-05-02 DIAGNOSIS — E7800 Pure hypercholesterolemia, unspecified: Secondary | ICD-10-CM

## 2015-05-02 DIAGNOSIS — M8580 Other specified disorders of bone density and structure, unspecified site: Secondary | ICD-10-CM

## 2015-05-02 NOTE — Progress Notes (Signed)
Orders placed for f/u labs.  

## 2015-05-08 ENCOUNTER — Other Ambulatory Visit: Payer: Self-pay | Admitting: Internal Medicine

## 2015-05-08 DIAGNOSIS — E890 Postprocedural hypothyroidism: Secondary | ICD-10-CM | POA: Diagnosis not present

## 2015-05-08 DIAGNOSIS — E041 Nontoxic single thyroid nodule: Secondary | ICD-10-CM

## 2015-05-11 ENCOUNTER — Ambulatory Visit
Admission: RE | Admit: 2015-05-11 | Discharge: 2015-05-11 | Disposition: A | Discharge status: Home / Self Care | Payer: Medicare Other | Source: Ambulatory Visit | Attending: Internal Medicine | Admitting: Internal Medicine

## 2015-05-11 DIAGNOSIS — E041 Nontoxic single thyroid nodule: Secondary | ICD-10-CM | POA: Diagnosis not present

## 2015-06-04 DIAGNOSIS — Z23 Encounter for immunization: Secondary | ICD-10-CM | POA: Diagnosis not present

## 2015-06-27 DIAGNOSIS — B351 Tinea unguium: Secondary | ICD-10-CM | POA: Diagnosis not present

## 2015-06-27 DIAGNOSIS — M79674 Pain in right toe(s): Secondary | ICD-10-CM | POA: Diagnosis not present

## 2015-06-27 DIAGNOSIS — E119 Type 2 diabetes mellitus without complications: Secondary | ICD-10-CM | POA: Diagnosis not present

## 2015-06-27 DIAGNOSIS — M2170 Unequal limb length (acquired), unspecified site: Secondary | ICD-10-CM | POA: Diagnosis not present

## 2015-06-27 DIAGNOSIS — M79675 Pain in left toe(s): Secondary | ICD-10-CM | POA: Diagnosis not present

## 2015-06-27 LAB — HM DIABETES FOOT EXAM

## 2015-07-10 DIAGNOSIS — M25562 Pain in left knee: Secondary | ICD-10-CM | POA: Diagnosis not present

## 2015-07-10 DIAGNOSIS — M2392 Unspecified internal derangement of left knee: Secondary | ICD-10-CM | POA: Diagnosis not present

## 2015-07-11 ENCOUNTER — Other Ambulatory Visit: Payer: Self-pay | Admitting: Physician Assistant

## 2015-07-11 DIAGNOSIS — M2392 Unspecified internal derangement of left knee: Secondary | ICD-10-CM

## 2015-07-25 ENCOUNTER — Encounter: Payer: Self-pay | Admitting: *Deleted

## 2015-07-27 ENCOUNTER — Telehealth: Payer: Self-pay | Admitting: Internal Medicine

## 2015-07-27 NOTE — Telephone Encounter (Signed)
Pt called stating that her new pharmacy will be [facility]

## 2015-07-27 NOTE — Telephone Encounter (Signed)
Changed in the chart.

## 2015-07-31 ENCOUNTER — Ambulatory Visit
Admission: RE | Admit: 2015-07-31 | Discharge: 2015-07-31 | Disposition: A | Discharge status: Home / Self Care | Payer: Medicare Other | Source: Ambulatory Visit | Attending: Physician Assistant | Admitting: Physician Assistant

## 2015-07-31 DIAGNOSIS — M1712 Unilateral primary osteoarthritis, left knee: Secondary | ICD-10-CM | POA: Insufficient documentation

## 2015-07-31 DIAGNOSIS — M23322 Other meniscus derangements, posterior horn of medial meniscus, left knee: Secondary | ICD-10-CM | POA: Insufficient documentation

## 2015-07-31 DIAGNOSIS — S83282A Other tear of lateral meniscus, current injury, left knee, initial encounter: Secondary | ICD-10-CM | POA: Diagnosis not present

## 2015-07-31 DIAGNOSIS — M25562 Pain in left knee: Secondary | ICD-10-CM | POA: Diagnosis not present

## 2015-07-31 DIAGNOSIS — M2392 Unspecified internal derangement of left knee: Secondary | ICD-10-CM

## 2015-08-01 ENCOUNTER — Other Ambulatory Visit: Payer: Self-pay

## 2015-08-01 ENCOUNTER — Other Ambulatory Visit: Payer: Self-pay | Admitting: Internal Medicine

## 2015-08-01 DIAGNOSIS — E041 Nontoxic single thyroid nodule: Secondary | ICD-10-CM

## 2015-08-01 MED ORDER — PRAVASTATIN SODIUM 10 MG PO TABS: 10.0000 mg | ORAL_TABLET | Freq: Every day | ORAL | Status: DC

## 2015-08-01 MED ORDER — SYNTHROID 75 MCG PO TABS: ORAL_TABLET | ORAL | Status: DC

## 2015-08-14 DIAGNOSIS — M2392 Unspecified internal derangement of left knee: Secondary | ICD-10-CM | POA: Diagnosis not present

## 2015-08-20 ENCOUNTER — Encounter: Payer: Self-pay | Admitting: Internal Medicine

## 2015-08-20 ENCOUNTER — Ambulatory Visit (INDEPENDENT_AMBULATORY_CARE_PROVIDER_SITE_OTHER): Payer: Medicare Other | Admitting: Internal Medicine

## 2015-08-20 VITALS — BP 110/60 | HR 71 | Temp 98.2°F | Resp 18 | Ht 63.0 in | Wt 143.5 lb

## 2015-08-20 DIAGNOSIS — E119 Type 2 diabetes mellitus without complications: Secondary | ICD-10-CM | POA: Diagnosis not present

## 2015-08-20 DIAGNOSIS — Z01818 Encounter for other preprocedural examination: Secondary | ICD-10-CM

## 2015-08-20 DIAGNOSIS — M8580 Other specified disorders of bone density and structure, unspecified site: Secondary | ICD-10-CM | POA: Diagnosis not present

## 2015-08-20 DIAGNOSIS — R011 Cardiac murmur, unspecified: Secondary | ICD-10-CM

## 2015-08-20 DIAGNOSIS — Z0000 Encounter for general adult medical examination without abnormal findings: Secondary | ICD-10-CM

## 2015-08-20 DIAGNOSIS — E0590 Thyrotoxicosis, unspecified without thyrotoxic crisis or storm: Secondary | ICD-10-CM | POA: Diagnosis not present

## 2015-08-20 DIAGNOSIS — E041 Nontoxic single thyroid nodule: Secondary | ICD-10-CM

## 2015-08-20 DIAGNOSIS — E7800 Pure hypercholesterolemia, unspecified: Secondary | ICD-10-CM

## 2015-08-20 DIAGNOSIS — M25562 Pain in left knee: Secondary | ICD-10-CM

## 2015-08-20 NOTE — Progress Notes (Signed)
Pre-visit discussion using our clinic review tool. No additional management support is needed unless otherwise documented below in the visit note.  

## 2015-08-20 NOTE — Progress Notes (Signed)
Patient ID: Shannon Mckinney, female   DOB: 20-Jan-1929, 82 y.o.   MRN: [id number]   Subjective:    Patient ID: Shannon Mckinney, female    DOB: 05-14-1929, 82 y.o.   MRN: [id number]  HPI  Patient with past history of diabetes, hypercholesterolemia and hyperthyroidism s/p ablation with resulting hypothyroidism.  She comes in today to follow up on these issues as well as for a complete physical exam.  She has been having increased pain in her left knee.  Limiting her activity.  She saw Dr Marry Guan last week.  Planning to proceed with surgery (arthroscopy).  She denies any chest pain or tightness.  No sob.  No acid reflux.  No abdominal pain or cramping.  No urine change.  Bowels stable.  No diarrhea.  Saw Dr Eddie Dibbles for her thyroid.  They are following her thyroid with serial ultrasounds.  She declines mammogram.     Past Medical History  Diagnosis Date  . Diabetes mellitus ([place])     diet controlled  . Hypercholesterolemia   . History of abnormal Pap smear   . Hyperthyroidism     s/p ablation  . Fibrocystic breast disease   . Benign head tremor    Past Surgical History  Procedure Laterality Date  . Breast biopsy  70's    benign  . Carpal tunnel release      right hand  . Tonsillectomy     Family History  Problem Relation Age of Onset  . Heart disease Father     myocardial infarction - died 52  . Diabetes Father   . Heart disease Mother     myocardial infarction  . Heart disease Brother     x2  . Diabetes Brother     x2  . Diabetes Sister   . Breast cancer Neg Hx   . Colon cancer Neg Hx    Social History   Social History  . Marital Status: Married    Spouse Name: N/A  . Number of Children: 2  . Years of Education: N/A   Social History Main Topics  . Smoking status: Never Smoker   . Smokeless tobacco: Never Used  . Alcohol Use: 0.0 oz/week    0 Standard drinks or equivalent per week     Comment: occasional  . Drug Use: No  . Sexual Activity: Not Asked   Other Topics Concern  .  None   Social History Narrative    Outpatient Encounter Prescriptions as of 08/20/2015  Medication Sig  . meloxicam (MOBIC) 7.5 MG tablet Take by mouth.  . pravastatin (PRAVACHOL) 10 MG tablet Take 1 tablet (10 mg total) by mouth daily.  Marland Kitchen SYNTHROID 75 MCG tablet TAKE 1 TABLET BY MOUTH DAILY BEFORE BREAKFAST  . [DISCONTINUED] Cholecalciferol (VITAMIN D-3) 1000 UNITS CAPS Take 1 capsule by mouth daily.   No facility-administered encounter medications on file as of 08/20/2015.    Review of Systems  Constitutional: Negative for appetite change and unexpected weight change.  HENT: Negative for congestion and sinus pressure.   Eyes: Negative for pain and visual disturbance.  Respiratory: Negative for cough, chest tightness and shortness of breath.   Cardiovascular: Negative for chest pain, palpitations and leg swelling.  Gastrointestinal: Negative for nausea, vomiting, abdominal pain and diarrhea.  Genitourinary: Negative for dysuria and difficulty urinating.  Musculoskeletal: Negative for back pain.       Persistent left knee pain.  Limiting her activity.    Skin: Negative for color change and  rash.  Neurological: Negative for dizziness, light-headedness and headaches.  Hematological: Negative for adenopathy. Does not bruise/bleed easily.  Psychiatric/Behavioral: Negative for dysphoric mood and agitation.       Objective:     Blood pressure rechecked by me:  120/62  Physical Exam  Constitutional: She is oriented to person, place, and time. She appears well-developed and well-nourished. No distress.  HENT:  Nose: Nose normal.  Mouth/Throat: Oropharynx is clear and moist.  Eyes: Right eye exhibits no discharge. Left eye exhibits no discharge. No scleral icterus.  Neck: Neck supple. No thyromegaly present.  Cardiovascular: Normal rate and regular rhythm.   1/6 systolic murmur.    Pulmonary/Chest: Breath sounds normal. No accessory muscle usage. No tachypnea. No respiratory  distress. She has no decreased breath sounds. She has no wheezes. She has no rhonchi. Right breast exhibits no inverted nipple, no mass, no nipple discharge and no tenderness (no axillary adenopathy). Left breast exhibits no inverted nipple, no mass, no nipple discharge and no tenderness (no axilarry adenopathy).  Abdominal: Soft. Bowel sounds are normal. There is no tenderness.  Musculoskeletal: She exhibits no edema or tenderness.  Lymphadenopathy:    She has no cervical adenopathy.  Neurological: She is alert and oriented to person, place, and time.  Skin: Skin is warm. No rash noted. No erythema.  Psychiatric: She has a normal mood and affect. Her behavior is normal.    BP 110/60 mmHg  Pulse 71  Temp(Src) 98.2 F (36.8 C) (Oral)  Resp 18  Ht _0  (1.6 m)  Wt 143 lb 8 oz (65.091 kg)  BMI 25.43 kg/m2  SpO2 95% Wt Readings from Last 3 Encounters:  08/20/15 143 lb 8 oz (65.091 kg)  02/15/15 137 lb 2 oz (62.199 kg)  08/03/14 136 lb 4 oz (61.803 kg)     Lab Results  Component Value Date   WBC 10.1 08/10/2014   HGB 13.4 08/10/2014   HCT 40.5 08/10/2014   PLT 205.0 08/10/2014   GLUCOSE 116* 03/08/2015   CHOL 309* 03/08/2015   TRIG 101.0 03/08/2015   HDL 68.90 03/08/2015   LDLDIRECT 130.6 06/22/2013   LDLCALC 220* 03/08/2015   ALT 10 05/01/2015   AST 16 05/01/2015   NA 141 03/08/2015   K 4.0 03/08/2015   CL 100 03/08/2015   CREATININE 0.73 03/08/2015   BUN 16 03/08/2015   CO2 30 03/08/2015   TSH 3.39 03/08/2015   HGBA1C 6.3 03/08/2015   MICROALBUR 5.1* 03/08/2015    Mr Knee Left  Wo Contrast  07/31/2015  CLINICAL DATA:  Diffuse left knee pain for 1 year with limited range of motion. No known injury. Subsequent encounter. EXAM: MRI OF THE LEFT KNEE WITHOUT CONTRAST TECHNIQUE: Multiplanar, multisequence MR imaging of the knee was performed. No intravenous contrast was administered. COMPARISON:  None. FINDINGS: MENISCI Medial meniscus: Degenerative signal is seen in the  posterior horn but no tear is identified. Lateral meniscus: Fraying along the free edge of the body is identified. A horizontal tear at the junction of the anterior horn and body reaches the femoral articular surface. LIGAMENTS Cruciates:  Intact. Collaterals:  Intact. CARTILAGE Patellofemoral:  Mildly degenerated. Medial:  Mildly degenerated. Lateral:  Mildly degenerated. Joint:  Very small joint effusion. Popliteal Fossa:  No Baker's cyst. Extensor Mechanism:  Intact. Bones: Small osteophytes are present about the lateral compartment. No fracture, stress change or worrisome marrow lesion. IMPRESSION: Fraying along the free edge of the body of the lateral meniscus with a horizontal tear  at the junction of the anterior horn and body. Degenerative signal posterior horn medial meniscus without tear identified. Very mild osteoarthritis. Electronically Signed   By: Inge Rise M.D.   On: 07/31/2015 11:03       Assessment & Plan:   Problem List Items Addressed This Visit    Diabetes mellitus ([place])    Low carb diet and exercise.  Follow met b and a1c.        Health care maintenance    Physical today 08/20/15.  Declines mammogram.  Has declined GI evaluation.        Hypercholesterolemia    On pravastatin.  Low cholesterol diet and exercise.  Follow lipid panel and liver function tests.        Hyperthyroidism    S/p ablation.  On synthroid.  Follow tsh.        Left knee pain    Persistent left knee pain.  Limiting activity.  Saw Dr Marry Guan.  Wants to proceed with surgery.        Murmur    Murmur is not new.  ECHO 2012 - mild AI and mild LVH.  EF >55%.        Osteopenia    Discussed bone density.  Wants to hold on testing at this time.  Continue vitamin D and weight bearing exercise.        Pre-op evaluation    EKG obtained and revealed SR with non specific ST/T changes.  Reports no chest pain or tightness.  Murmur not new.  Question if slightly more prominent.  Will have cardiology  evaluate prior to her surgery with question of need for further cardiac w/up.  Will need close intra op and post op monitoring of her heart rate and blood pressure to avoid extremes.  Check routine labs.        Thyroid nodule    Being followed by Dr Eddie Dibbles.  Refer to her note for details.  Recommended f/u ultrasound.         Other Visit Diagnoses    Preoperative clearance    -  Primary    Relevant Orders    EKG 12-Lead (Completed)    CBC with Differential/Platelet        Einar Pheasant, MD

## 2015-08-21 ENCOUNTER — Encounter: Payer: Self-pay | Admitting: Internal Medicine

## 2015-08-21 DIAGNOSIS — R011 Cardiac murmur, unspecified: Secondary | ICD-10-CM | POA: Insufficient documentation

## 2015-08-21 DIAGNOSIS — Z01818 Encounter for other preprocedural examination: Secondary | ICD-10-CM | POA: Insufficient documentation

## 2015-08-21 NOTE — Assessment & Plan Note (Signed)
Discussed bone density.  Wants to hold on testing at this time.  Continue vitamin D and weight bearing exercise.

## 2015-08-21 NOTE — Assessment & Plan Note (Signed)
Being followed by Dr Eddie Dibbles.  Refer to her note for details.  Recommended f/u ultrasound.

## 2015-08-21 NOTE — Assessment & Plan Note (Signed)
S/p ablation.  On synthroid.  Follow tsh.

## 2015-08-21 NOTE — Assessment & Plan Note (Signed)
Low carb diet and exercise.  Follow met b and a1c.   

## 2015-08-21 NOTE — Assessment & Plan Note (Signed)
Physical today 08/20/15.  Declines mammogram.  Has declined GI evaluation.

## 2015-08-21 NOTE — Assessment & Plan Note (Signed)
On pravastatin.  Low cholesterol diet and exercise.  Follow lipid panel and liver function tests.   

## 2015-08-21 NOTE — Assessment & Plan Note (Signed)
Murmur is not new.  ECHO 2012 - mild AI and mild LVH.  EF>55% 

## 2015-08-21 NOTE — Assessment & Plan Note (Addendum)
EKG obtained and revealed SR with non specific ST/T changes.  Reports no chest pain or tightness.  Murmur not new.  Question if slightly more prominent.  Will have cardiology evaluate prior to her surgery with question of need for further cardiac w/up.  Will need close intra op and post op monitoring of her heart rate and blood pressure to avoid extremes.  Check routine labs.

## 2015-08-21 NOTE — Addendum Note (Signed)
Addended by: Alisa Graff on: 08/21/2015 06:11 AM   Modules accepted: Orders, SmartSet

## 2015-08-21 NOTE — Assessment & Plan Note (Signed)
Persistent left knee pain.  Limiting activity.  Saw Dr Marry Guan.  Wants to proceed with surgery.

## 2015-08-23 ENCOUNTER — Other Ambulatory Visit (INDEPENDENT_AMBULATORY_CARE_PROVIDER_SITE_OTHER): Payer: Medicare Other

## 2015-08-23 ENCOUNTER — Telehealth: Payer: Self-pay | Admitting: Internal Medicine

## 2015-08-23 DIAGNOSIS — E7800 Pure hypercholesterolemia, unspecified: Secondary | ICD-10-CM

## 2015-08-23 DIAGNOSIS — M8580 Other specified disorders of bone density and structure, unspecified site: Secondary | ICD-10-CM

## 2015-08-23 DIAGNOSIS — Z01818 Encounter for other preprocedural examination: Secondary | ICD-10-CM

## 2015-08-23 DIAGNOSIS — E119 Type 2 diabetes mellitus without complications: Secondary | ICD-10-CM

## 2015-08-23 DIAGNOSIS — E0590 Thyrotoxicosis, unspecified without thyrotoxic crisis or storm: Secondary | ICD-10-CM | POA: Diagnosis not present

## 2015-08-23 LAB — CBC WITH DIFFERENTIAL/PLATELET
BASOS PCT: 0.4 % (ref 0.0–3.0)
Basophils Absolute: 0 10*3/uL (ref 0.0–0.1)
EOS PCT: 2.7 % (ref 0.0–5.0)
Eosinophils Absolute: 0.2 10*3/uL (ref 0.0–0.7)
HEMATOCRIT: 41.6 % (ref 36.0–46.0)
HEMOGLOBIN: 13.8 g/dL (ref 12.0–15.0)
LYMPHS PCT: 27.2 % (ref 12.0–46.0)
Lymphs Abs: 1.8 10*3/uL (ref 0.7–4.0)
MCHC: 33.1 g/dL (ref 30.0–36.0)
MCV: 91.3 fl (ref 78.0–100.0)
MONO ABS: 0.5 10*3/uL (ref 0.1–1.0)
Monocytes Relative: 7.3 % (ref 3.0–12.0)
NEUTROS ABS: 4.2 10*3/uL (ref 1.4–7.7)
Neutrophils Relative %: 62.4 % (ref 43.0–77.0)
Platelets: 198 10*3/uL (ref 150.0–400.0)
RBC: 4.56 Mil/uL (ref 3.87–5.11)
RDW: 13.7 % (ref 11.5–15.5)
WBC: 6.7 10*3/uL (ref 4.0–10.5)

## 2015-08-23 LAB — LIPID PANEL
CHOLESTEROL: 215 mg/dL — AB (ref 0–200)
HDL: 72.9 mg/dL (ref >39.00)
LDL CALC: 119 mg/dL — AB (ref 0–99)
NonHDL: 141.95
Total CHOL/HDL Ratio: 3
Triglycerides: 115 mg/dL (ref 0.0–149.0)
VLDL: 23 mg/dL (ref 0.0–40.0)

## 2015-08-23 LAB — BASIC METABOLIC PANEL
BUN: 20 mg/dL (ref 6–23)
CO2: 32 mEq/L (ref 19–32)
CREATININE: 0.76 mg/dL (ref 0.40–1.20)
Calcium: 9.1 mg/dL (ref 8.4–10.5)
Chloride: 99 mEq/L (ref 96–112)
GFR: 76.92 mL/min (ref >60.00)
Glucose, Bld: 112 mg/dL — ABNORMAL HIGH (ref 70–99)
POTASSIUM: 3.7 meq/L (ref 3.5–5.1)
Sodium: 140 mEq/L (ref 135–145)

## 2015-08-23 LAB — HEPATIC FUNCTION PANEL
ALT: 13 U/L (ref 0–35)
AST: 16 U/L (ref 0–37)
Albumin: 4.5 g/dL (ref 3.5–5.2)
Alkaline Phosphatase: 54 U/L (ref 39–117)
BILIRUBIN DIRECT: 0.2 mg/dL (ref 0.0–0.3)
Total Bilirubin: 0.9 mg/dL (ref 0.2–1.2)
Total Protein: 7 g/dL (ref 6.0–8.3)

## 2015-08-23 LAB — VITAMIN D 25 HYDROXY (VIT D DEFICIENCY, FRACTURES): VITD: 27.71 ng/mL — ABNORMAL LOW (ref 30.00–100.00)

## 2015-08-23 LAB — TSH: TSH: 4.55 u[IU]/mL — ABNORMAL HIGH (ref 0.35–4.50)

## 2015-08-23 LAB — HEMOGLOBIN A1C: Hgb A1c MFr Bld: 6.3 % (ref 4.6–6.5)

## 2015-08-23 NOTE — Telephone Encounter (Signed)
Pt dropped off a handicap application to be filled out by Dr. Nicki Reaper. Paper is in Dr. Bary Leriche box.

## 2015-08-24 ENCOUNTER — Other Ambulatory Visit: Payer: Self-pay | Admitting: *Deleted

## 2015-08-24 MED ORDER — SYNTHROID 88 MCG PO TABS: 88.0000 ug | ORAL_TABLET | Freq: Every day | ORAL | Status: DC

## 2015-08-24 NOTE — Telephone Encounter (Signed)
Form mailed at patients request

## 2015-08-24 NOTE — Telephone Encounter (Signed)
Form completed and placed in your box.  

## 2015-08-24 NOTE — Telephone Encounter (Signed)
Placed in red folder or completion

## 2015-08-24 NOTE — Telephone Encounter (Signed)
Pt would to have it mailed back

## 2015-08-28 DIAGNOSIS — R011 Cardiac murmur, unspecified: Secondary | ICD-10-CM | POA: Diagnosis not present

## 2015-08-28 DIAGNOSIS — Z01810 Encounter for preprocedural cardiovascular examination: Secondary | ICD-10-CM | POA: Diagnosis not present

## 2015-09-04 ENCOUNTER — Encounter
Admission: RE | Admit: 2015-09-04 | Discharge: 2015-09-04 | Disposition: A | Discharge status: Home / Self Care | Payer: Medicare Other | Source: Ambulatory Visit | Attending: Orthopedic Surgery | Admitting: Orthopedic Surgery

## 2015-09-04 DIAGNOSIS — R011 Cardiac murmur, unspecified: Secondary | ICD-10-CM | POA: Diagnosis not present

## 2015-09-04 DIAGNOSIS — E119 Type 2 diabetes mellitus without complications: Secondary | ICD-10-CM | POA: Insufficient documentation

## 2015-09-04 DIAGNOSIS — E7800 Pure hypercholesterolemia, unspecified: Secondary | ICD-10-CM | POA: Diagnosis not present

## 2015-09-04 DIAGNOSIS — Z01810 Encounter for preprocedural cardiovascular examination: Secondary | ICD-10-CM | POA: Diagnosis not present

## 2015-09-04 DIAGNOSIS — Z01812 Encounter for preprocedural laboratory examination: Secondary | ICD-10-CM | POA: Diagnosis not present

## 2015-09-04 HISTORY — DX: Unspecified osteoarthritis, unspecified site: M19.90

## 2015-09-04 HISTORY — DX: Cardiac murmur, unspecified: R01.1

## 2015-09-04 LAB — BASIC METABOLIC PANEL
ANION GAP: 11 (ref 5–15)
BUN: 22 mg/dL — ABNORMAL HIGH (ref 6–20)
CHLORIDE: 102 mmol/L (ref 101–111)
CO2: 28 mmol/L (ref 22–32)
Calcium: 9.3 mg/dL (ref 8.9–10.3)
Creatinine, Ser: 0.73 mg/dL (ref 0.44–1.00)
GFR calc non Af Amer: >60 mL/min (ref >60)
Glucose, Bld: 97 mg/dL (ref 65–99)
Potassium: 3.8 mmol/L (ref 3.5–5.1)
Sodium: 141 mmol/L (ref 135–145)

## 2015-09-04 LAB — CBC
HCT: 39.5 % (ref 35.0–47.0)
Hemoglobin: 13.3 g/dL (ref 12.0–16.0)
MCH: 30.4 pg (ref 26.0–34.0)
MCHC: 33.8 g/dL (ref 32.0–36.0)
MCV: 89.9 fL (ref 80.0–100.0)
Platelets: 181 10*3/uL (ref 150–440)
RBC: 4.39 MIL/uL (ref 3.80–5.20)
RDW: 13.7 % (ref 11.5–14.5)
WBC: 7.7 10*3/uL (ref 3.6–11.0)

## 2015-09-04 NOTE — Patient Instructions (Signed)
  Your procedure is scheduled on: September 12, 2015 (Wednesday) Report to Day Surgery.[facility]) Second Floor To find out your arrival time please call [phone] between 1PM - 3PM on September 11, 2015 (Tuesday).  Remember: Instructions that are not followed completely may result in serious medical risk, up to and including death, or upon the discretion of your surgeon and anesthesiologist your surgery may need to be rescheduled.    __x__ 1. Do not eat food or drink liquids after midnight. No gum chewing or hard candies.     __x__ 2. No Alcohol for 24 hours before or after surgery.   ____ 3. Bring all medications with you on the day of surgery if instructed.    _x 4. Notify your doctor if there is any change in your medical condition     (cold, fever, infections).     Do not wear jewelry, make-up, hairpins, clips or nail polish.  Do not wear lotions, powders, or perfumes. You may wear deodorant.  Do not shave 48 hours prior to surgery. Men may shave face and neck.  Do not bring valuables to the hospital.    [facility] is not responsible for any belongings or valuables.               Contacts, dentures or bridgework may not be worn into surgery.  Leave your suitcase in the car. After surgery it may be brought to your room.  For patients admitted to the hospital, discharge time is determined by your                treatment team.   Patients discharged the day of surgery will not be allowed to drive home.   Please read over the following fact sheets that you were given:   Surgical Site Infection Prevention   ____ Take these medicines the morning of surgery with A SIP OF WATER:    1.   2.   3.   4.  5.  6.  ____ Fleet Enema (as directed)   __x__ Use CHG Soap as directed  ____ Use inhalers on the day of surgery  ____ Stop metformin 2 days prior to surgery    ____ Take 1/2 of usual insulin dose the night before surgery and none on the morning of surgery.   __x__  Stop Coumadin/Plavix/aspirin on (NO ASPIRIN)  __x__ Stop Anti-inflammatories on (STOP MELOXICAM ONE WEEK PRIOR TO SURGERY) TYLENOL OK TO TAKE FOR PAIN IF NEEDED (NO NSAIDS)   ____ Stop supplements until after surgery.    ____ Bring C-Pap to the hospital.

## 2015-09-07 DIAGNOSIS — R011 Cardiac murmur, unspecified: Secondary | ICD-10-CM | POA: Diagnosis not present

## 2015-09-07 DIAGNOSIS — Z01810 Encounter for preprocedural cardiovascular examination: Secondary | ICD-10-CM | POA: Diagnosis not present

## 2015-09-10 ENCOUNTER — Other Ambulatory Visit: Payer: Self-pay | Admitting: Internal Medicine

## 2015-09-10 NOTE — Pre-Procedure Instructions (Signed)
CLEARED BY DR PARASCHOS 08/28/15

## 2015-09-12 ENCOUNTER — Encounter
Admission: RE | Disposition: A | Discharge status: Home / Self Care | Payer: Self-pay | Source: Ambulatory Visit | Attending: Orthopedic Surgery

## 2015-09-12 ENCOUNTER — Ambulatory Visit: Payer: Medicare Other | Admitting: Anesthesiology

## 2015-09-12 ENCOUNTER — Ambulatory Visit
Admission: RE | Admit: 2015-09-12 | Discharge: 2015-09-12 | Disposition: A | Discharge status: Home / Self Care | Payer: Medicare Other | Source: Ambulatory Visit | Attending: Orthopedic Surgery | Admitting: Orthopedic Surgery

## 2015-09-12 ENCOUNTER — Encounter: Payer: Self-pay | Admitting: *Deleted

## 2015-09-12 DIAGNOSIS — S83282A Other tear of lateral meniscus, current injury, left knee, initial encounter: Secondary | ICD-10-CM | POA: Diagnosis not present

## 2015-09-12 DIAGNOSIS — M25562 Pain in left knee: Secondary | ICD-10-CM | POA: Insufficient documentation

## 2015-09-12 DIAGNOSIS — M23222 Derangement of posterior horn of medial meniscus due to old tear or injury, left knee: Secondary | ICD-10-CM | POA: Diagnosis not present

## 2015-09-12 DIAGNOSIS — Z880 Allergy status to penicillin: Secondary | ICD-10-CM | POA: Diagnosis not present

## 2015-09-12 DIAGNOSIS — M23252 Derangement of posterior horn of lateral meniscus due to old tear or injury, left knee: Secondary | ICD-10-CM | POA: Diagnosis not present

## 2015-09-12 DIAGNOSIS — M94262 Chondromalacia, left knee: Secondary | ICD-10-CM | POA: Diagnosis not present

## 2015-09-12 DIAGNOSIS — M23242 Derangement of anterior horn of lateral meniscus due to old tear or injury, left knee: Secondary | ICD-10-CM | POA: Insufficient documentation

## 2015-09-12 DIAGNOSIS — Z888 Allergy status to other drugs, medicaments and biological substances status: Secondary | ICD-10-CM | POA: Insufficient documentation

## 2015-09-12 DIAGNOSIS — Z79899 Other long term (current) drug therapy: Secondary | ICD-10-CM | POA: Insufficient documentation

## 2015-09-12 DIAGNOSIS — M2392 Unspecified internal derangement of left knee: Secondary | ICD-10-CM | POA: Diagnosis present

## 2015-09-12 DIAGNOSIS — E119 Type 2 diabetes mellitus without complications: Secondary | ICD-10-CM | POA: Diagnosis not present

## 2015-09-12 DIAGNOSIS — Z833 Family history of diabetes mellitus: Secondary | ICD-10-CM | POA: Insufficient documentation

## 2015-09-12 DIAGNOSIS — S83242A Other tear of medial meniscus, current injury, left knee, initial encounter: Secondary | ICD-10-CM | POA: Diagnosis not present

## 2015-09-12 HISTORY — PX: KNEE ARTHROSCOPY: SHX127

## 2015-09-12 LAB — GLUCOSE, CAPILLARY
GLUCOSE-CAPILLARY: 113 mg/dL — AB (ref 65–99)
Glucose-Capillary: 114 mg/dL — ABNORMAL HIGH (ref 65–99)

## 2015-09-12 SURGERY — ARTHROSCOPY, KNEE
Anesthesia: Choice | Site: Knee | Laterality: Left | Wound class: Clean

## 2015-09-12 MED ORDER — METOCLOPRAMIDE HCL 5 MG/ML IJ SOLN: 5.0000 mg | Freq: Three times a day (TID) | INTRAMUSCULAR | Status: DC | PRN

## 2015-09-12 MED ORDER — ONDANSETRON HCL 4 MG/2ML IJ SOLN: 4.0000 mg | Freq: Once | INTRAMUSCULAR | Status: DC | PRN

## 2015-09-12 MED ORDER — MORPHINE SULFATE (PF) 4 MG/ML IV SOLN
INTRAVENOUS | Status: DC | PRN
Start: 2015-09-12 — End: 2015-09-12
  Administered 2015-09-12: 4 mg

## 2015-09-12 MED ORDER — FAMOTIDINE 20 MG PO TABS
ORAL_TABLET | ORAL | Status: AC
  Administered 2015-09-12: 20 mg via ORAL
  Filled 2015-09-12: qty 1

## 2015-09-12 MED ORDER — HYDROCODONE-ACETAMINOPHEN 5-325 MG PO TABS: 1.0000 | ORAL_TABLET | ORAL | Status: DC | PRN

## 2015-09-12 MED ORDER — MIDAZOLAM HCL 2 MG/2ML IJ SOLN
INTRAMUSCULAR | Status: DC | PRN
  Administered 2015-09-12: 1 mg via INTRAVENOUS

## 2015-09-12 MED ORDER — ONDANSETRON HCL 4 MG PO TABS: 4.0000 mg | ORAL_TABLET | Freq: Four times a day (QID) | ORAL | Status: DC | PRN

## 2015-09-12 MED ORDER — MORPHINE SULFATE (PF) 4 MG/ML IV SOLN
INTRAVENOUS | Status: AC
  Filled 2015-09-12: qty 1

## 2015-09-12 MED ORDER — PROPOFOL 10 MG/ML IV BOLUS
INTRAVENOUS | Status: DC | PRN
  Administered 2015-09-12: 150 mg via INTRAVENOUS

## 2015-09-12 MED ORDER — BUPIVACAINE-EPINEPHRINE 0.25% -1:200000 IJ SOLN
INTRAMUSCULAR | Status: DC | PRN
  Administered 2015-09-12: 5 mL
  Administered 2015-09-12: 25 mL

## 2015-09-12 MED ORDER — ONDANSETRON HCL 4 MG/2ML IJ SOLN: 4.0000 mg | Freq: Four times a day (QID) | INTRAMUSCULAR | Status: DC | PRN

## 2015-09-12 MED ORDER — ACETAMINOPHEN 10 MG/ML IV SOLN
INTRAVENOUS | Status: AC
  Filled 2015-09-12: qty 100

## 2015-09-12 MED ORDER — BUPIVACAINE-EPINEPHRINE (PF) 0.25% -1:200000 IJ SOLN
INTRAMUSCULAR | Status: AC
  Filled 2015-09-12: qty 30

## 2015-09-12 MED ORDER — ONDANSETRON HCL 4 MG/2ML IJ SOLN
INTRAMUSCULAR | Status: DC | PRN
  Administered 2015-09-12: 4 mg via INTRAVENOUS

## 2015-09-12 MED ORDER — ACETAMINOPHEN 10 MG/ML IV SOLN
INTRAVENOUS | Status: DC | PRN
  Administered 2015-09-12: 1000 mg via INTRAVENOUS

## 2015-09-12 MED ORDER — METOCLOPRAMIDE HCL 10 MG PO TABS: 5.0000 mg | ORAL_TABLET | Freq: Three times a day (TID) | ORAL | Status: DC | PRN

## 2015-09-12 MED ORDER — LIDOCAINE HCL (CARDIAC) 20 MG/ML IV SOLN
INTRAVENOUS | Status: DC | PRN
  Administered 2015-09-12: 30 mg via INTRAVENOUS

## 2015-09-12 MED ORDER — SODIUM CHLORIDE 0.9 % IV SOLN: INTRAVENOUS | Status: DC

## 2015-09-12 MED ORDER — FENTANYL CITRATE (PF) 100 MCG/2ML IJ SOLN: 25.0000 ug | INTRAMUSCULAR | Status: DC | PRN

## 2015-09-12 MED ORDER — FENTANYL CITRATE (PF) 100 MCG/2ML IJ SOLN
INTRAMUSCULAR | Status: DC | PRN
  Administered 2015-09-12: 50 ug via INTRAVENOUS

## 2015-09-12 MED ORDER — FAMOTIDINE 20 MG PO TABS
20.0000 mg | ORAL_TABLET | Freq: Once | ORAL | Status: AC
  Administered 2015-09-12: 20 mg via ORAL

## 2015-09-12 MED ORDER — SODIUM CHLORIDE 0.9 % IV SOLN
INTRAVENOUS | Status: DC
  Administered 2015-09-12 (×2): via INTRAVENOUS

## 2015-09-12 SURGICAL SUPPLY — 23 items
BLADE SHAVER 4.5 DBL SERAT CV (CUTTER) ×3 IMPLANT
BNDG ESMARK 6X12 TAN STRL LF (GAUZE/BANDAGES/DRESSINGS) ×3 IMPLANT
DRSG DERMACEA 8X12 NADH (GAUZE/BANDAGES/DRESSINGS) ×3 IMPLANT
DURAPREP 26ML APPLICATOR (WOUND CARE) ×6 IMPLANT
GAUZE SPONGE 4X4 12PLY STRL (GAUZE/BANDAGES/DRESSINGS) ×3 IMPLANT
GLOVE BIOGEL M STRL SZ7.5 (GLOVE) ×3 IMPLANT
GLOVE INDICATOR 8.0 STRL GRN (GLOVE) ×3 IMPLANT
GOWN STRL REUS W/ TWL LRG LVL3 (GOWN DISPOSABLE) ×1 IMPLANT
GOWN STRL REUS W/ TWL LRG LVL4 (GOWN DISPOSABLE) ×1 IMPLANT
GOWN STRL REUS W/TWL LRG LVL3 (GOWN DISPOSABLE) ×2
GOWN STRL REUS W/TWL LRG LVL4 (GOWN DISPOSABLE) ×2
IV LACTATED RINGER IRRG 3000ML (IV SOLUTION) ×12
IV LR IRRIG 3000ML ARTHROMATIC (IV SOLUTION) ×6 IMPLANT
MANIFOLD NEPTUNE II (INSTRUMENTS) ×3 IMPLANT
PACK ARTHROSCOPY KNEE (MISCELLANEOUS) ×3 IMPLANT
SET TUBE SUCT SHAVER OUTFL 24K (TUBING) ×3 IMPLANT
SET TUBE TIP INTRA-ARTICULAR (MISCELLANEOUS) ×3 IMPLANT
STRAP SAFETY BODY (MISCELLANEOUS) ×3 IMPLANT
SUT ETHILON 3-0 FS-10 30 BLK (SUTURE) ×3
SUTURE EHLN 3-0 FS-10 30 BLK (SUTURE) ×1 IMPLANT
TUBING ARTHRO INFLOW-ONLY STRL (TUBING) ×3 IMPLANT
WAND HAND CNTRL MULTIVAC 50 (MISCELLANEOUS) ×3 IMPLANT
WRAP KNEE W/COLD PACKS 25.5X14 (SOFTGOODS) ×3 IMPLANT

## 2015-09-12 NOTE — Anesthesia Procedure Notes (Signed)
Procedure Name: LMA Insertion Date/Time: 09/12/2015 7:25 AM Performed by: Courtney Paris Pre-anesthesia Checklist: Patient identified, Emergency Drugs available, Suction available, Patient being monitored and Timeout performed Patient Re-evaluated:Patient Re-evaluated prior to inductionOxygen Delivery Method: Circle system utilized Preoxygenation: Pre-oxygenation with 100% oxygen Intubation Type: IV induction and Combination inhalational/ intravenous induction Ventilation: Mask ventilation without difficulty LMA: LMA inserted LMA Size: 3.0 Grade View: Grade II Number of attempts: 1 Tube secured with: Tape Dental Injury: Teeth and Oropharynx as per pre-operative assessment

## 2015-09-12 NOTE — Transfer of Care (Signed)
Immediate Anesthesia Transfer of Care Note  Patient: Shannon Mckinney  Procedure(s) Performed: Procedure(s): ARTHROSCOPY LEFT KNEE, PARTIAL MEDIAL MENISECTOMY, PARTIAL LATERAL MENISECTOMY, CHONDROPLASTY MEDIAL AND LATERAL (Left)  Patient Location: PACU  Anesthesia Type:General  Level of Consciousness: sedated  Airway & Oxygen Therapy: Patient Spontanous Breathing and Patient connected to face mask oxygen  Post-op Assessment: Report given to RN and Post -op Vital signs reviewed and stable  Post vital signs: Reviewed and stable  Last Vitals:  Filed Vitals:   09/12/15 0614 09/12/15 0854  BP: 142/80 106/63  Pulse: 75 79  Temp: 36.6 C 36.4 C  Resp: 16 13    Complications: No apparent anesthesia complications

## 2015-09-12 NOTE — H&P (Signed)
The patient has been re-examined, and the chart reviewed, and there have been no interval changes to the documented history and physical.    The risks, benefits, and alternatives have been discussed at length. The patient expressed understanding of the risks benefits and agreed with plans for surgical intervention.  James P. Hooten, Jr. M.D.    

## 2015-09-12 NOTE — Anesthesia Preprocedure Evaluation (Signed)
Anesthesia Evaluation  Patient identified by MRN, date of birth, ID band Patient awake    Reviewed: Allergy & Precautions, H&P , NPO status , Patient's Chart, lab work & pertinent test results, reviewed documented beta blocker date and time   Airway Mallampati: II  TM Distance: >3 FB Neck ROM: full    Dental  (+) Teeth Intact   Pulmonary neg pulmonary ROS,    Pulmonary exam normal        Cardiovascular Exercise Tolerance: Good negative cardio ROS Normal cardiovascular exam+ Valvular Problems/Murmurs  Rate:Normal     Neuro/Psych negative neurological ROS  negative psych ROS   GI/Hepatic negative GI ROS, Neg liver ROS,   Endo/Other  negative endocrine ROSdiabetesHyperthyroidism   Renal/GU negative Renal ROS  negative genitourinary   Musculoskeletal   Abdominal   Peds  Hematology negative hematology ROS (+)   Anesthesia Other Findings   Reproductive/Obstetrics negative OB ROS                             Anesthesia Physical Anesthesia Plan  ASA: II  Anesthesia Plan: General LMA   Post-op Pain Management:    Induction:   Airway Management Planned:   Additional Equipment:   Intra-op Plan:   Post-operative Plan:   Informed Consent: I have reviewed the patients History and Physical, chart, labs and discussed the procedure including the risks, benefits and alternatives for the proposed anesthesia with the patient or authorized representative who has indicated his/her understanding and acceptance.     Plan Discussed with: CRNA  Anesthesia Plan Comments:         Anesthesia Quick Evaluation

## 2015-09-12 NOTE — Op Note (Signed)
OPERATIVE NOTE  DATE OF SURGERY:  09/12/2015  PATIENT NAME:  Shannon Mckinney   DOB: [birth year]  MRN: [id number]   PRE-OPERATIVE DIAGNOSIS:  Internal derangement of the left knee   POST-OPERATIVE DIAGNOSIS:   Tear of the posterior horn of the medial meniscus, left knee Tear of the anterior and posterior horns of the lateral meniscus, left knee Grade 3 chondromalacia of the medial femoral condyle, left knee Rate 4 chondromalacia of the lateral tibial plateau, left knee  PROCEDURE:  Left knee arthroscopy, partial medial and lateral meniscectomies, and chondroplasty  SURGEON:  Marciano Sequin., M.D.   ASSISTANT: none  ANESTHESIA: general  ESTIMATED BLOOD LOSS: Minimal  FLUIDS REPLACED: 600 mL of crystalloid  TOURNIQUET TIME: Not used   DRAINS: none  IMPLANTS UTILIZED: None  INDICATIONS FOR SURGERY: Shannon Mckinney is a 82 y.o. year old female who has been seen for complaints of left knee pain. MRI demonstrated findings consistent with meniscal pathology. After discussion of the risks and benefits of surgical intervention, the patient expressed understanding of the risks benefits and agree with plans for left knee arthroscopy.   PROCEDURE IN DETAIL: The patient was brought into the operating room and, after adequate general anesthesia was achieved, a tourniquet was applied to the left thigh and the leg was placed in the leg holder. All bony prominences were well padded. The patient's left knee was cleaned and prepped with alcohol and Duraprep and draped in the usual sterile fashion. A "timeout" was performed as per usual protocol. The anticipated portal sites were injected with 0.25% Marcaine with epinephrine. An anterolateral incision was made and a cannula was inserted. A moderate effusion was evacuated and the knee was distended with fluid using the pump. The scope was advanced down the medial gutter into the medial compartment. Under visualization with the scope, an anteromedial portal was  created and a hooked probe was inserted. The medial meniscus was visualized and probed. There was a degenerative tear of the posterior horn of the medial meniscus. The tear was debrided using meniscal punches and a 4.5 mm incisor shaver. Final contouring was performed using the 50 ArthroCare wand. The remaining rim of meniscus was visualized and probed and felt to be stable. The articular cartilage was visualized. There was an area of grade 3 chondromalacia involving the medial femoral condyle. The area was debrided and contoured using the ArthroCare wand.  The scope was then advanced into the intercondylar notch. The anterior cruciate ligament was visualized and probed and felt to be intact. The scope was removed from the lateral portal and reinserted via the anteromedial portal to better visualize the lateral compartment. The lateral meniscus was visualized and probed. A complex tears of the anterior and posterior horns of the lateral meniscus were encountered. The tears were debrided using meniscal punches and a 4.5 mm incisor shaver. Final contouring was performed using the 50 ArthroCare wand. The remaining rim of meniscus was visualized and probed and felt to be stable. The articular cartilage of the lateral compartment was visualized. There was an area along the lateral aspect of the lateral tibial plateau where there was grade 4 chondromalacia. The margins were debrided and contoured using the ArthroCare wand. Finally, the scope was advanced so as to visualize the patellofemoral articulation. Good patellar tracking was appreciated. The articular surface was in good condition.  The knee was irrigated with copius amounts of fluid and suctioned dry. The anterolateral portal was re-approximated with #3-0 nylon. A combination of 0.25%  Marcaine with epinephrine and 4 mg of Morphine were injected via the scope. The scope was removed and the anteromedial portal was re-approximated with #3-0 nylon. A sterile  dressing was applied followed by application of an ice wrap.  The patient tolerated the procedure well and was transported to the PACU in stable condition.  Ishia Tenorio P. Holley Bouche., M.D.

## 2015-09-12 NOTE — Brief Op Note (Signed)
09/12/2015  8:50 AM  PATIENT:  Shannon Mckinney  82 y.o. female  PRE-OPERATIVE DIAGNOSIS:  internal derangement of the left knee  POST-OPERATIVE DIAGNOSIS:  Tear of the posterior horn of the medial meniscus, left knee Tear of the anterior and posterior horns of the lateral meniscus, left knee Grade 3 chondromalacia of the medial compartment, left knee Grade 4 chondromalacia of the lateral compartment, left knee  PROCEDURE:  Procedure(s): ARTHROSCOPY LEFT KNEE, PARTIAL MEDIAL MENISECTOMY, PARTIAL LATERAL MENISECTOMY, CHONDROPLASTY MEDIAL AND LATERAL (Left)  SURGEON:  Surgeon(s) and Role:    * Dereck Leep, MD - Primary  ASSISTANTS: none   ANESTHESIA:   general  EBL:  Total I/O In: 600 [I.V.:600] Out: -  minimal  BLOOD ADMINISTERED:none  DRAINS: none   LOCAL MEDICATIONS USED:  MARCAINE     SPECIMEN:  No Specimen  DISPOSITION OF SPECIMEN:  N/A  COUNTS:  YES  TOURNIQUET:   not used  DICTATION: .Sales executive  PLAN OF CARE: Discharge to home after PACU  PATIENT DISPOSITION:  PACU - hemodynamically stable.   Delay start of Pharmacological VTE agent (>24hrs) due to surgical blood loss or risk of bleeding: not applicable

## 2015-09-12 NOTE — Discharge Instructions (Signed)
°  Instructions after Knee Arthroscopy    James P. Holley Bouche., M.D.     Dept. of [facility]  [place], [place]  [address]   Phone: [phone]   Fax: [phone]   DIET:  Drink plenty of non-alcoholic fluids & begin a light diet.  Resume your normal diet the day after surgery.  ACTIVITY:   You may use crutches or a walker with weight-bearing as tolerated, unless instructed otherwise.  You may wean yourself off of the walker or crutches as tolerated.   Begin doing gentle exercises. Exercising will reduce the pain and swelling, increase motion, and prevent muscle weakness.    Avoid strenuous activities or athletics for a minimum of 4-6 weeks after arthroscopic surgery.  Do not drive or operate any equipment until instructed.  WOUND CARE:   Place one to two pillows under the knee the first day or two when sitting or lying.   Continue to use the ice packs periodically to reduce pain and swelling.  The small incisions in your knee are closed with nylon stitches. The stitches will be removed in the office.  The bulky dressing may be removed on the second day after surgery. DO NOT TOUCH THE STITCHES. Put a Band-Aid over each stitch. Do NOT use any ointments or creams on the incisions.   You may bathe or shower after the stitches are removed at the first office visit following surgery.  MEDICATIONS:  You may resume your regular medications.  Please take the pain medication as prescribed.  Do not take pain medication on an empty stomach.  Do not drive or drink alcoholic beverages when taking pain medications.  CALL THE OFFICE FOR:  Temperature above 101 degrees  Excessive bleeding or drainage on the dressing.  Excessive swelling, coldness, or paleness of the toes.  Persistent nausea and vomiting.  FOLLOW-UP:  You should have an appointment to return to the office in 7-10 days after surgery.  AMBULATORY  SURGERY  DISCHARGE INSTRUCTIONS   The drugs that you were given will stay in your system until tomorrow so for the next 24 hours you should not:  Drive an automobile Make any legal decisions Drink any alcoholic beverage   You may resume regular meals tomorrow.  Today it is better to start with liquids and gradually work up to solid foods.  You may eat anything you prefer, but it is better to start with liquids, then soup and crackers, and gradually work up to solid foods.   Please notify your doctor immediately if you have any unusual bleeding, trouble breathing, redness and pain at the surgery site, drainage, fever, or pain not relieved by medication.    Additional Instructions:         Please contact your physician with any problems or Same Day Surgery at [phone], Monday through Friday 6 am to 4 pm, or [facility] number at [phone].

## 2015-09-13 NOTE — Anesthesia Postprocedure Evaluation (Signed)
Anesthesia Post Note  Patient: Shannon Mckinney  Procedure(s) Performed: Procedure(s) (LRB): ARTHROSCOPY LEFT KNEE, PARTIAL MEDIAL MENISECTOMY, PARTIAL LATERAL MENISECTOMY, CHONDROPLASTY MEDIAL AND LATERAL (Left)  Patient location during evaluation: PACU Anesthesia Type: General Level of consciousness: awake and alert Pain management: pain level controlled Vital Signs Assessment: post-procedure vital signs reviewed and stable Respiratory status: spontaneous breathing, nonlabored ventilation, respiratory function stable and patient connected to nasal cannula oxygen Cardiovascular status: blood pressure returned to baseline and stable Postop Assessment: no signs of nausea or vomiting Anesthetic complications: no    Last Vitals:  Filed Vitals:   09/12/15 1025 09/12/15 1054  BP: 133/62   Pulse: 61   Temp: 35.5 C 35.8 C  Resp: 18     Last Pain:  Filed Vitals:   09/12/15 1054  PainSc: 2                  Molli Barrows

## 2015-10-09 ENCOUNTER — Telehealth: Payer: Self-pay | Admitting: *Deleted

## 2015-10-09 ENCOUNTER — Other Ambulatory Visit (INDEPENDENT_AMBULATORY_CARE_PROVIDER_SITE_OTHER): Payer: Medicare Other

## 2015-10-09 DIAGNOSIS — E039 Hypothyroidism, unspecified: Secondary | ICD-10-CM | POA: Diagnosis not present

## 2015-10-09 NOTE — Telephone Encounter (Signed)
Labs and dx?  

## 2015-10-09 NOTE — Telephone Encounter (Signed)
Order placed for tsh 

## 2015-10-10 ENCOUNTER — Other Ambulatory Visit: Payer: Self-pay | Admitting: Internal Medicine

## 2015-10-10 ENCOUNTER — Encounter: Payer: Self-pay | Admitting: *Deleted

## 2015-10-10 DIAGNOSIS — E0590 Thyrotoxicosis, unspecified without thyrotoxic crisis or storm: Secondary | ICD-10-CM

## 2015-10-10 DIAGNOSIS — E119 Type 2 diabetes mellitus without complications: Secondary | ICD-10-CM

## 2015-10-10 DIAGNOSIS — E7800 Pure hypercholesterolemia, unspecified: Secondary | ICD-10-CM

## 2015-10-10 LAB — TSH: TSH: 1.08 u[IU]/mL (ref 0.35–4.50)

## 2015-10-10 NOTE — Progress Notes (Signed)
Order placed for f/u labs.  

## 2015-10-25 DIAGNOSIS — H6123 Impacted cerumen, bilateral: Secondary | ICD-10-CM | POA: Diagnosis not present

## 2015-10-25 DIAGNOSIS — R42 Dizziness and giddiness: Secondary | ICD-10-CM | POA: Diagnosis not present

## 2015-11-03 DIAGNOSIS — Z9889 Other specified postprocedural states: Secondary | ICD-10-CM | POA: Insufficient documentation

## 2015-11-12 ENCOUNTER — Ambulatory Visit
Admission: RE | Admit: 2015-11-12 | Discharge: 2015-11-12 | Disposition: A | Discharge status: Home / Self Care | Payer: Medicare Other | Source: Ambulatory Visit | Attending: Internal Medicine | Admitting: Internal Medicine

## 2015-11-12 DIAGNOSIS — E041 Nontoxic single thyroid nodule: Secondary | ICD-10-CM | POA: Diagnosis not present

## 2016-01-21 DIAGNOSIS — C4492 Squamous cell carcinoma of skin, unspecified: Secondary | ICD-10-CM

## 2016-01-21 HISTORY — DX: Squamous cell carcinoma of skin, unspecified: C44.92

## 2016-01-22 DIAGNOSIS — D229 Melanocytic nevi, unspecified: Secondary | ICD-10-CM | POA: Diagnosis not present

## 2016-01-22 DIAGNOSIS — Z1283 Encounter for screening for malignant neoplasm of skin: Secondary | ICD-10-CM | POA: Diagnosis not present

## 2016-01-22 DIAGNOSIS — Z85828 Personal history of other malignant neoplasm of skin: Secondary | ICD-10-CM | POA: Diagnosis not present

## 2016-01-22 DIAGNOSIS — D1800 Hemangioma unspecified site: Secondary | ICD-10-CM | POA: Diagnosis not present

## 2016-01-22 DIAGNOSIS — L821 Other seborrheic keratosis: Secondary | ICD-10-CM | POA: Diagnosis not present

## 2016-01-22 DIAGNOSIS — I8393 Asymptomatic varicose veins of bilateral lower extremities: Secondary | ICD-10-CM | POA: Diagnosis not present

## 2016-02-13 ENCOUNTER — Other Ambulatory Visit (INDEPENDENT_AMBULATORY_CARE_PROVIDER_SITE_OTHER): Payer: Medicare Other

## 2016-02-13 DIAGNOSIS — E7800 Pure hypercholesterolemia, unspecified: Secondary | ICD-10-CM

## 2016-02-13 DIAGNOSIS — E0590 Thyrotoxicosis, unspecified without thyrotoxic crisis or storm: Secondary | ICD-10-CM

## 2016-02-13 DIAGNOSIS — E119 Type 2 diabetes mellitus without complications: Secondary | ICD-10-CM

## 2016-02-13 LAB — BASIC METABOLIC PANEL
BUN: 15 mg/dL (ref 6–23)
CHLORIDE: 102 meq/L (ref 96–112)
CO2: 32 meq/L (ref 19–32)
Calcium: 9.2 mg/dL (ref 8.4–10.5)
Creatinine, Ser: 0.68 mg/dL (ref 0.40–1.20)
GFR: 87.36 mL/min (ref >60.00)
Glucose, Bld: 111 mg/dL — ABNORMAL HIGH (ref 70–99)
Potassium: 4.1 mEq/L (ref 3.5–5.1)
Sodium: 143 mEq/L (ref 135–145)

## 2016-02-13 LAB — HEPATIC FUNCTION PANEL
ALT: 13 U/L (ref 0–35)
AST: 14 U/L (ref 0–37)
Albumin: 4.2 g/dL (ref 3.5–5.2)
Alkaline Phosphatase: 61 U/L (ref 39–117)
BILIRUBIN DIRECT: 0.2 mg/dL (ref 0.0–0.3)
TOTAL PROTEIN: 6.8 g/dL (ref 6.0–8.3)
Total Bilirubin: 0.7 mg/dL (ref 0.2–1.2)

## 2016-02-13 LAB — LIPID PANEL
CHOLESTEROL: 204 mg/dL — AB (ref 0–200)
HDL: 62.9 mg/dL (ref >39.00)
LDL CALC: 117 mg/dL — AB (ref 0–99)
NonHDL: 140.94
TRIGLYCERIDES: 119 mg/dL (ref 0.0–149.0)
Total CHOL/HDL Ratio: 3
VLDL: 23.8 mg/dL (ref 0.0–40.0)

## 2016-02-13 LAB — HEMOGLOBIN A1C: Hgb A1c MFr Bld: 6.3 % (ref 4.6–6.5)

## 2016-02-13 LAB — TSH: TSH: 0.41 u[IU]/mL (ref 0.35–4.50)

## 2016-02-14 ENCOUNTER — Encounter: Payer: Self-pay | Admitting: Internal Medicine

## 2016-02-15 ENCOUNTER — Other Ambulatory Visit: Payer: BLUE CROSS/BLUE SHIELD

## 2016-02-19 ENCOUNTER — Ambulatory Visit (INDEPENDENT_AMBULATORY_CARE_PROVIDER_SITE_OTHER): Payer: Medicare Other | Admitting: Internal Medicine

## 2016-02-19 ENCOUNTER — Encounter: Payer: Self-pay | Admitting: Internal Medicine

## 2016-02-19 DIAGNOSIS — R634 Abnormal weight loss: Secondary | ICD-10-CM

## 2016-02-19 DIAGNOSIS — R2681 Unsteadiness on feet: Secondary | ICD-10-CM | POA: Diagnosis not present

## 2016-02-19 DIAGNOSIS — E0590 Thyrotoxicosis, unspecified without thyrotoxic crisis or storm: Secondary | ICD-10-CM

## 2016-02-19 DIAGNOSIS — M25562 Pain in left knee: Secondary | ICD-10-CM

## 2016-02-19 DIAGNOSIS — E7800 Pure hypercholesterolemia, unspecified: Secondary | ICD-10-CM | POA: Diagnosis not present

## 2016-02-19 DIAGNOSIS — E119 Type 2 diabetes mellitus without complications: Secondary | ICD-10-CM | POA: Diagnosis not present

## 2016-02-19 DIAGNOSIS — E041 Nontoxic single thyroid nodule: Secondary | ICD-10-CM

## 2016-02-19 NOTE — Progress Notes (Signed)
Subjective:    Patient ID: Shannon Mckinney, female    DOB: 12/13/1928, 82 y.o.   MRN: [id number]  HPI  Patient here for a scheduled follow up.  She reports she is doing relatively well.  Tries to stay active.  Is s/p arthroscopy of her left knee.  Since her procedure, she has noticed some change in her balance.  No falls.  Just a little more unsteady.  Feels related to her legs.  No dizziness.  No light headedness.  No chest pain or tightness.  No sob.  No acid reflux reported.  No abdominal pain or cramping.  Bowels stable.  Agreeable to therapy to help with balance and gait.     Past Medical History:  Diagnosis Date  . Arthritis   . Benign head tremor   . Diabetes mellitus ([place])    diet controlled  . Fibrocystic breast disease   . Heart murmur   . History of abnormal Pap smear   . Hypercholesterolemia   . Hyperthyroidism    s/p ablation   Past Surgical History:  Procedure Laterality Date  . BREAST BIOPSY  70's   benign  . CARPAL TUNNEL RELEASE     right hand  . KNEE ARTHROSCOPY Left 09/12/2015   Procedure: ARTHROSCOPY LEFT KNEE, PARTIAL MEDIAL MENISECTOMY, PARTIAL LATERAL MENISECTOMY, CHONDROPLASTY MEDIAL AND LATERAL;  Surgeon: Dereck Leep, MD;  Location: [facility] ORS;  Service: Orthopedics;  Laterality: Left;  . SHOULDER ARTHROSCOPY    . TONSILLECTOMY     Family History  Problem Relation Age of Onset  . Heart disease Father     myocardial infarction - died 44  . Diabetes Father   . Heart disease Mother     myocardial infarction  . Heart disease Brother     x2  . Diabetes Brother     x2  . Diabetes Sister   . Breast cancer Neg Hx   . Colon cancer Neg Hx    Social History   Social History  . Marital status: Married    Spouse name: N/A  . Number of children: 2  . Years of education: N/A   Social History Main Topics  . Smoking status: Never Smoker  . Smokeless tobacco: Never Used  . Alcohol use 0.0 oz/week     Comment: occasional  . Drug use: No  . Sexual  activity: Not Asked   Other Topics Concern  . None   Social History Narrative  . None    Outpatient Encounter Prescriptions as of 02/19/2016  Medication Sig  . cholecalciferol (VITAMIN D) 1000 units tablet Take 1,000 Units by mouth daily.  . pravastatin (PRAVACHOL) 10 MG tablet TAKE ONE TABLET BY MOUTH EVERY DAY  . [DISCONTINUED] SYNTHROID 88 MCG tablet Take 1 tablet (88 mcg total) by mouth daily before breakfast.  . [DISCONTINUED] HYDROcodone-acetaminophen (NORCO) 5-325 MG tablet Take 1-2 tablets by mouth every 4 (four) hours as needed for moderate pain. (Patient not taking: Reported on 02/19/2016)  . [DISCONTINUED] meloxicam (MOBIC) 7.5 MG tablet Take 7.5 mg by mouth 2 (two) times daily with a meal.    No facility-administered encounter medications on file as of 02/19/2016.     Review of Systems  Constitutional: Negative for appetite change and unexpected weight change.  HENT: Negative for congestion and sinus pressure.   Respiratory: Negative for cough, chest tightness and shortness of breath.   Cardiovascular: Negative for chest pain, palpitations and leg swelling.  Gastrointestinal: Negative for abdominal pain, diarrhea, nausea  and vomiting.  Genitourinary: Negative for difficulty urinating and dysuria.  Musculoskeletal:       S/p knee arthroplasty.  Doing well with her knee.  Unsteadiness as outlined.    Neurological: Negative for dizziness, light-headedness and headaches.  Psychiatric/Behavioral: Negative for agitation and dysphoric mood.       Objective:    Physical Exam  Constitutional: She appears well-developed and well-nourished. No distress.  HENT:  Nose: Nose normal.  Mouth/Throat: Oropharynx is clear and moist.  Neck: Neck supple. No thyromegaly present.  Cardiovascular: Normal rate and regular rhythm.   Pulmonary/Chest: Breath sounds normal. No respiratory distress. She has no wheezes.  Abdominal: Soft. Bowel sounds are normal. There is no tenderness.    Musculoskeletal: She exhibits no edema or tenderness.  Lymphadenopathy:    She has no cervical adenopathy.  Skin: No rash noted. No erythema.  Psychiatric: She has a normal mood and affect. Her behavior is normal.    BP 120/60   Pulse 64   Wt 138 lb (62.6 kg)   SpO2 94%   BMI 24.45 kg/m  Wt Readings from Last 3 Encounters:  02/19/16 138 lb (62.6 kg)  09/12/15 145 lb (65.8 kg)  09/04/15 145 lb (65.8 kg)     Lab Results  Component Value Date   WBC 7.7 09/04/2015   HGB 13.3 09/04/2015   HCT 39.5 09/04/2015   PLT 181 09/04/2015   GLUCOSE 111 (H) 02/13/2016   CHOL 204 (H) 02/13/2016   TRIG 119.0 02/13/2016   HDL 62.90 02/13/2016   LDLDIRECT 130.6 06/22/2013   LDLCALC 117 (H) 02/13/2016   ALT 13 02/13/2016   AST 14 02/13/2016   NA 143 02/13/2016   K 4.1 02/13/2016   CL 102 02/13/2016   CREATININE 0.68 02/13/2016   BUN 15 02/13/2016   CO2 32 02/13/2016   TSH 0.41 02/13/2016   HGBA1C 6.3 02/13/2016   MICROALBUR 5.1 (H) 03/08/2015    US Thyroid  Result Date: 11/12/2015 CLINICAL DATA:  Right-sided thyroid nodule. History of thyroid ablation many years ago. EXAM: THYROID ULTRASOUND TECHNIQUE: Ultrasound examination of the thyroid gland and adjacent soft tissues was performed. COMPARISON:  Thyroid ultrasound - 05/11/2015 FINDINGS: There is diffuse heterogeneity of the thyroid parenchymal echotexture. No new or enlarging thyroid nodules for Right thyroid lobe Measurements: Diminutive in size measuring 1.5 x 0.7 x 0.8 cm. Right, mid - 0.6 x 0.2 x 0.5 cm - hypoechoic, solid unchanged to decreased in size in interval, previously, 0.5 x 0.6 x 0.5 cm Left thyroid lobe Measurements: Diminutive in size measure 1.5 x 0.5 x 0.5 cm There is diffuse heterogeneity of the left lobe of the thyroid without discrete nodule or mass. Isthmus Thickness: Normal in size measures 0.2 cm in diameter. No discrete nodule or mass is identified within the thyroid isthmus. Lymphadenopathy None visualized.  IMPRESSION: 1. Similar findings of atrophic and heterogeneous appearing thyroid gland compatible with provided history of remote thyroid ablation. 2. Solitary punctate (approximately 0.6 cm) nodule within the right lobe of the thyroid is unchanged to slightly decreased in size since the 04/2015 examination. No new or enlarging thyroid nodules. Electronically Signed   By: Sandi Mariscal M.D.   On: 11/12/2015 14:08       Assessment & Plan:   Problem List Items Addressed This Visit    Diabetes mellitus ([place])    Low carb diet and exercise.  Follow met b and a1c.        Hypercholesterolemia    Low cholesterol  diet and exercise.  On pravastatin.  Follow lipid panel and liver function tests.        Hyperthyroidism    S/p ablation.  On synthroid.  Follow tsh.        Left knee pain    S/p arthroscopy.   Doing well.        Loss of weight    Has had weight loss as outlined.  States is eating regular meals.  No GI symptoms.  Feels good.  Recent tsh, metabolic panel and liver panel wnl.  Follow.        Thyroid nodule    Being followed by Dr Eddie Dibbles.  Just had ultrasound 10/2015.  See results.        Unsteadiness    Unsteadiness as outlined.  Refer to PT for evaluation and treatment.        Relevant Orders   Ambulatory referral to Physical Therapy    Other Visit Diagnoses   None.      Einar Pheasant, MD

## 2016-02-20 ENCOUNTER — Other Ambulatory Visit: Payer: Self-pay | Admitting: Internal Medicine

## 2016-02-23 ENCOUNTER — Encounter: Payer: Self-pay | Admitting: Internal Medicine

## 2016-02-23 DIAGNOSIS — R634 Abnormal weight loss: Secondary | ICD-10-CM | POA: Insufficient documentation

## 2016-02-23 NOTE — Assessment & Plan Note (Signed)
Low cholesterol diet and exercise.  On pravastatin.  Follow lipid panel and liver function tests.   

## 2016-02-23 NOTE — Assessment & Plan Note (Signed)
S/p ablation.  On synthroid.  Follow tsh.

## 2016-02-23 NOTE — Assessment & Plan Note (Signed)
Being followed by Dr Eddie Dibbles.  Just had ultrasound 10/2015.  See results.

## 2016-02-23 NOTE — Assessment & Plan Note (Signed)
Unsteadiness as outlined.  Refer to PT for evaluation and treatment.

## 2016-02-23 NOTE — Assessment & Plan Note (Signed)
Low carb diet and exercise.  Follow met b and a1c.   

## 2016-02-23 NOTE — Assessment & Plan Note (Signed)
Has had weight loss as outlined.  States is eating regular meals.  No GI symptoms.  Feels good.  Recent tsh, metabolic panel and liver panel wnl.  Follow.

## 2016-02-23 NOTE — Assessment & Plan Note (Signed)
S/p arthroscopy.   Doing well.

## 2016-02-27 DIAGNOSIS — M6281 Muscle weakness (generalized): Secondary | ICD-10-CM | POA: Diagnosis not present

## 2016-02-27 DIAGNOSIS — R262 Difficulty in walking, not elsewhere classified: Secondary | ICD-10-CM | POA: Diagnosis not present

## 2016-02-27 DIAGNOSIS — M25562 Pain in left knee: Secondary | ICD-10-CM | POA: Diagnosis not present

## 2016-02-27 DIAGNOSIS — M25662 Stiffness of left knee, not elsewhere classified: Secondary | ICD-10-CM | POA: Diagnosis not present

## 2016-03-03 DIAGNOSIS — M25562 Pain in left knee: Secondary | ICD-10-CM | POA: Diagnosis not present

## 2016-03-03 DIAGNOSIS — M25662 Stiffness of left knee, not elsewhere classified: Secondary | ICD-10-CM | POA: Diagnosis not present

## 2016-03-03 DIAGNOSIS — R262 Difficulty in walking, not elsewhere classified: Secondary | ICD-10-CM | POA: Diagnosis not present

## 2016-03-03 DIAGNOSIS — M6281 Muscle weakness (generalized): Secondary | ICD-10-CM | POA: Diagnosis not present

## 2016-03-05 DIAGNOSIS — M25662 Stiffness of left knee, not elsewhere classified: Secondary | ICD-10-CM | POA: Diagnosis not present

## 2016-03-05 DIAGNOSIS — M6281 Muscle weakness (generalized): Secondary | ICD-10-CM | POA: Diagnosis not present

## 2016-03-05 DIAGNOSIS — M25562 Pain in left knee: Secondary | ICD-10-CM | POA: Diagnosis not present

## 2016-03-05 DIAGNOSIS — R262 Difficulty in walking, not elsewhere classified: Secondary | ICD-10-CM | POA: Diagnosis not present

## 2016-03-10 DIAGNOSIS — R262 Difficulty in walking, not elsewhere classified: Secondary | ICD-10-CM | POA: Diagnosis not present

## 2016-03-10 DIAGNOSIS — M25662 Stiffness of left knee, not elsewhere classified: Secondary | ICD-10-CM | POA: Diagnosis not present

## 2016-03-10 DIAGNOSIS — M25562 Pain in left knee: Secondary | ICD-10-CM | POA: Diagnosis not present

## 2016-03-10 DIAGNOSIS — M6281 Muscle weakness (generalized): Secondary | ICD-10-CM | POA: Diagnosis not present

## 2016-03-12 DIAGNOSIS — M25662 Stiffness of left knee, not elsewhere classified: Secondary | ICD-10-CM | POA: Diagnosis not present

## 2016-03-12 DIAGNOSIS — R262 Difficulty in walking, not elsewhere classified: Secondary | ICD-10-CM | POA: Diagnosis not present

## 2016-03-12 DIAGNOSIS — M25562 Pain in left knee: Secondary | ICD-10-CM | POA: Diagnosis not present

## 2016-03-12 DIAGNOSIS — M6281 Muscle weakness (generalized): Secondary | ICD-10-CM | POA: Diagnosis not present

## 2016-03-17 DIAGNOSIS — R262 Difficulty in walking, not elsewhere classified: Secondary | ICD-10-CM | POA: Diagnosis not present

## 2016-03-17 DIAGNOSIS — M25562 Pain in left knee: Secondary | ICD-10-CM | POA: Diagnosis not present

## 2016-03-17 DIAGNOSIS — M25662 Stiffness of left knee, not elsewhere classified: Secondary | ICD-10-CM | POA: Diagnosis not present

## 2016-03-17 DIAGNOSIS — M6281 Muscle weakness (generalized): Secondary | ICD-10-CM | POA: Diagnosis not present

## 2016-03-19 DIAGNOSIS — R262 Difficulty in walking, not elsewhere classified: Secondary | ICD-10-CM | POA: Diagnosis not present

## 2016-03-19 DIAGNOSIS — M25662 Stiffness of left knee, not elsewhere classified: Secondary | ICD-10-CM | POA: Diagnosis not present

## 2016-03-19 DIAGNOSIS — M6281 Muscle weakness (generalized): Secondary | ICD-10-CM | POA: Diagnosis not present

## 2016-03-19 DIAGNOSIS — M25562 Pain in left knee: Secondary | ICD-10-CM | POA: Diagnosis not present

## 2016-03-24 DIAGNOSIS — M6281 Muscle weakness (generalized): Secondary | ICD-10-CM | POA: Diagnosis not present

## 2016-03-24 DIAGNOSIS — R262 Difficulty in walking, not elsewhere classified: Secondary | ICD-10-CM | POA: Diagnosis not present

## 2016-03-24 DIAGNOSIS — M25662 Stiffness of left knee, not elsewhere classified: Secondary | ICD-10-CM | POA: Diagnosis not present

## 2016-03-24 DIAGNOSIS — M25562 Pain in left knee: Secondary | ICD-10-CM | POA: Diagnosis not present

## 2016-03-26 DIAGNOSIS — M25562 Pain in left knee: Secondary | ICD-10-CM | POA: Diagnosis not present

## 2016-03-26 DIAGNOSIS — M6281 Muscle weakness (generalized): Secondary | ICD-10-CM | POA: Diagnosis not present

## 2016-03-26 DIAGNOSIS — M25662 Stiffness of left knee, not elsewhere classified: Secondary | ICD-10-CM | POA: Diagnosis not present

## 2016-03-26 DIAGNOSIS — R262 Difficulty in walking, not elsewhere classified: Secondary | ICD-10-CM | POA: Diagnosis not present

## 2016-04-01 DIAGNOSIS — M25662 Stiffness of left knee, not elsewhere classified: Secondary | ICD-10-CM | POA: Diagnosis not present

## 2016-04-01 DIAGNOSIS — M6281 Muscle weakness (generalized): Secondary | ICD-10-CM | POA: Diagnosis not present

## 2016-04-01 DIAGNOSIS — M25562 Pain in left knee: Secondary | ICD-10-CM | POA: Diagnosis not present

## 2016-04-01 DIAGNOSIS — R262 Difficulty in walking, not elsewhere classified: Secondary | ICD-10-CM | POA: Diagnosis not present

## 2016-04-03 DIAGNOSIS — M6281 Muscle weakness (generalized): Secondary | ICD-10-CM | POA: Diagnosis not present

## 2016-04-03 DIAGNOSIS — M25662 Stiffness of left knee, not elsewhere classified: Secondary | ICD-10-CM | POA: Diagnosis not present

## 2016-04-03 DIAGNOSIS — M25562 Pain in left knee: Secondary | ICD-10-CM | POA: Diagnosis not present

## 2016-04-03 DIAGNOSIS — R262 Difficulty in walking, not elsewhere classified: Secondary | ICD-10-CM | POA: Diagnosis not present

## 2016-04-08 DIAGNOSIS — M25662 Stiffness of left knee, not elsewhere classified: Secondary | ICD-10-CM | POA: Diagnosis not present

## 2016-04-08 DIAGNOSIS — M6281 Muscle weakness (generalized): Secondary | ICD-10-CM | POA: Diagnosis not present

## 2016-04-08 DIAGNOSIS — M25562 Pain in left knee: Secondary | ICD-10-CM | POA: Diagnosis not present

## 2016-04-08 DIAGNOSIS — R262 Difficulty in walking, not elsewhere classified: Secondary | ICD-10-CM | POA: Diagnosis not present

## 2016-04-10 DIAGNOSIS — M25562 Pain in left knee: Secondary | ICD-10-CM | POA: Diagnosis not present

## 2016-04-10 DIAGNOSIS — R262 Difficulty in walking, not elsewhere classified: Secondary | ICD-10-CM | POA: Diagnosis not present

## 2016-04-10 DIAGNOSIS — M6281 Muscle weakness (generalized): Secondary | ICD-10-CM | POA: Diagnosis not present

## 2016-04-10 DIAGNOSIS — M25662 Stiffness of left knee, not elsewhere classified: Secondary | ICD-10-CM | POA: Diagnosis not present

## 2016-04-15 DIAGNOSIS — M25562 Pain in left knee: Secondary | ICD-10-CM | POA: Diagnosis not present

## 2016-04-15 DIAGNOSIS — R262 Difficulty in walking, not elsewhere classified: Secondary | ICD-10-CM | POA: Diagnosis not present

## 2016-04-15 DIAGNOSIS — M6281 Muscle weakness (generalized): Secondary | ICD-10-CM | POA: Diagnosis not present

## 2016-04-15 DIAGNOSIS — M25662 Stiffness of left knee, not elsewhere classified: Secondary | ICD-10-CM | POA: Diagnosis not present

## 2016-04-22 DIAGNOSIS — R262 Difficulty in walking, not elsewhere classified: Secondary | ICD-10-CM | POA: Diagnosis not present

## 2016-04-22 DIAGNOSIS — M25562 Pain in left knee: Secondary | ICD-10-CM | POA: Diagnosis not present

## 2016-04-22 DIAGNOSIS — M25662 Stiffness of left knee, not elsewhere classified: Secondary | ICD-10-CM | POA: Diagnosis not present

## 2016-04-22 DIAGNOSIS — M6281 Muscle weakness (generalized): Secondary | ICD-10-CM | POA: Diagnosis not present

## 2016-04-29 DIAGNOSIS — M6281 Muscle weakness (generalized): Secondary | ICD-10-CM | POA: Diagnosis not present

## 2016-04-29 DIAGNOSIS — M25562 Pain in left knee: Secondary | ICD-10-CM | POA: Diagnosis not present

## 2016-04-29 DIAGNOSIS — R262 Difficulty in walking, not elsewhere classified: Secondary | ICD-10-CM | POA: Diagnosis not present

## 2016-04-29 DIAGNOSIS — M25662 Stiffness of left knee, not elsewhere classified: Secondary | ICD-10-CM | POA: Diagnosis not present

## 2016-05-21 ENCOUNTER — Ambulatory Visit: Payer: BLUE CROSS/BLUE SHIELD | Admitting: Internal Medicine

## 2016-05-27 ENCOUNTER — Telehealth: Payer: Self-pay | Admitting: Internal Medicine

## 2016-05-27 NOTE — Telephone Encounter (Signed)
I called pt and left a vm to call office to sch AWV. Thank you! °

## 2016-06-06 ENCOUNTER — Encounter: Payer: Self-pay | Admitting: Internal Medicine

## 2016-06-06 ENCOUNTER — Ambulatory Visit (INDEPENDENT_AMBULATORY_CARE_PROVIDER_SITE_OTHER): Payer: Medicare Other | Admitting: Internal Medicine

## 2016-06-06 DIAGNOSIS — E041 Nontoxic single thyroid nodule: Secondary | ICD-10-CM

## 2016-06-06 DIAGNOSIS — E7800 Pure hypercholesterolemia, unspecified: Secondary | ICD-10-CM

## 2016-06-06 DIAGNOSIS — R634 Abnormal weight loss: Secondary | ICD-10-CM | POA: Diagnosis not present

## 2016-06-06 DIAGNOSIS — R0981 Nasal congestion: Secondary | ICD-10-CM

## 2016-06-06 DIAGNOSIS — E0590 Thyrotoxicosis, unspecified without thyrotoxic crisis or storm: Secondary | ICD-10-CM | POA: Diagnosis not present

## 2016-06-06 DIAGNOSIS — E559 Vitamin D deficiency, unspecified: Secondary | ICD-10-CM

## 2016-06-06 DIAGNOSIS — E119 Type 2 diabetes mellitus without complications: Secondary | ICD-10-CM

## 2016-06-06 NOTE — Progress Notes (Signed)
Pre visit review using our clinic review tool, if applicable. No additional management support is needed unless otherwise documented below in the visit note. 

## 2016-06-06 NOTE — Progress Notes (Addendum)
Subjective:   Shannon Mckinney is a 82 y.o. female who presents for an Initial Medicare Annual Wellness Visit.  Review of Systems    No ROS.  Medicare Wellness Visit.  Cardiac Risk Factors include: advanced age (>38mn, >>82women);diabetes mellitus     Objective:    Today's Vitals   06/06/16 1203  BP: (!) 144/68  Pulse: 83  Temp: 98 F (36.7 C)  TempSrc: Oral  SpO2: 93%  Weight: 139 lb 9.6 oz (63.3 kg)  Height: '5\' 3"'$  (1.6 m)   Body mass index is 24.73 kg/m.   Current Medications (verified) Outpatient Encounter Prescriptions as of 06/06/2016  Medication Sig  . cholecalciferol (VITAMIN D) 1000 units tablet Take 1,000 Units by mouth daily.  .Marland KitchenSYNTHROID 88 MCG tablet TAKE 1 TABLET BY MOUTH DAILY BEFORE BREAKFAST  . pravastatin (PRAVACHOL) 10 MG tablet TAKE ONE TABLET BY MOUTH EVERY DAY (Patient not taking: Reported on 06/06/2016)   No facility-administered encounter medications on file as of 06/06/2016.     Allergies (verified) Mysoline [primidone] and Penicillins   History: Past Medical History:  Diagnosis Date  . Arthritis   . Benign head tremor   . Diabetes mellitus ([place]    diet controlled  . Fibrocystic breast disease   . Heart murmur   . History of abnormal Pap smear   . Hypercholesterolemia   . Hyperthyroidism    s/p ablation   Past Surgical History:  Procedure Laterality Date  . BREAST BIOPSY  70's   benign  . CARPAL TUNNEL RELEASE     right hand  . KNEE ARTHROSCOPY Left 09/12/2015   Procedure: ARTHROSCOPY LEFT KNEE, PARTIAL MEDIAL MENISECTOMY, PARTIAL LATERAL MENISECTOMY, CHONDROPLASTY MEDIAL AND LATERAL;  Surgeon: JDereck Leep MD;  Location: [facility] ORS;  Service: Orthopedics;  Laterality: Left;  . SHOULDER ARTHROSCOPY    . TONSILLECTOMY     Family History  Problem Relation Age of Onset  . Heart disease Father     myocardial infarction - died 570 . Diabetes Father   . Heart disease Mother     myocardial infarction  . Heart disease Brother     x2  . Diabetes Brother   . Diabetes Brother     x2  . Diabetes Sister   . Breast cancer Neg Hx   . Colon cancer Neg Hx    Social History   Occupational History  . Not on file.   Social History Main Topics  . Smoking status: Never Smoker  . Smokeless tobacco: Never Used  . Alcohol use 0.0 oz/week     Comment: occasional  . Drug use: No  . Sexual activity: Not Currently    Tobacco Counseling Counseling given: Not Answered   Activities of Daily Living In your present state of health, do you have any difficulty performing the following activities: 06/06/2016 09/04/2015  Hearing? N N  Vision? N N  Difficulty concentrating or making decisions? N N  Walking or climbing stairs? Y Y  Dressing or bathing? N N  Doing errands, shopping? N -  Preparing Food and eating ? N -  Using the Toilet? N -  In the past six months, have you accidently leaked urine? Y -  Do you have problems with loss of bowel control? N -  Managing your Medications? N -  Managing your Finances? N -  Housekeeping or managing your Housekeeping? N -  Some recent data might be hidden    Immunizations and Health Maintenance Immunization History  Administered Date(s) Administered  . Influenza Split 04/27/2012, 05/16/2013, 05/11/2014  . Influenza-Unspecified 06/04/2015  . Pneumococcal Conjugate-13 07/04/2013   Health Maintenance Due  Topic Date Due  . TETANUS/TDAP  11/29/1949  . ZOSTAVAX  11/30/1990  . DEXA SCAN  11/30/1995  . PNA vac Low Risk Adult (2 of 2 - PPSV23) 07/04/2014  . URINE MICROALBUMIN  03/07/2016  . OPHTHALMOLOGY EXAM  04/04/2016    Patient Care Team: Einar Pheasant, MD as PCP - General (Internal Medicine)  Indicate any recent Medical Services you may have received from other than [facility] providers in the past year (date may be approximate).     Assessment:   This is a routine wellness examination for Shannon Mckinney. The goal of the wellness visit is to assist the patient how to close the gaps  in care and create a preventative care plan for the patient.   Taking calcium VIT D as appropriate/Osteoporosis risk reviewed.  Medications reviewed; taking without issues or barriers.  Safety issues reviewed; lives with husband.  Smoke detectors in the home. No firearms in the home. Wears seatbelts when driving or riding with others. No violence in the home.  No identified risk were noted; The patient was oriented x 3; appropriate in dress and manner and no objective failures at ADL's or IADL's.   Body mass index; discussed the importance of a healthy diet, water intake and exercise. Educational material provided.  Patient Concerns: None at this time. Follow up with PCP as needed.  Hearing/Vision screen Hearing Screening Comments: Passes the whisper test Vision Screening Comments: Followed by [facility] Wears glasses Annual visits  Dietary issues and exercise activities discussed: Current Exercise Habits: Home exercise routine, Type of exercise: walking, Time (Minutes): 25, Frequency (Times/Week): 3, Weekly Exercise (Minutes/Week): 75, Intensity: Mild  Goals    . Healthy Lifestyle          Maintain drinking plenty of fluids, exercise regimen and low carb foods.      Depression Screen PHQ 2/9 Scores 06/06/2016 01/31/2014 12/20/2012 08/04/2012  PHQ - 2 Score 0 0 0 0    Fall Risk Fall Risk  06/06/2016 01/31/2014 12/20/2012 08/04/2012  Falls in the past year? No Yes No No  Number falls in past yr: - 1 - -  Injury with Fall? - No - -    Cognitive Function: MMSE - Mini Mental State Exam 06/06/2016  Orientation to time 5  Orientation to Place 5  Registration 3  Attention/ Calculation 5  Recall 3  Language- name 2 objects 2  Language- repeat 1  Language- follow 3 step command 3  Language- read & follow direction 1  Write a sentence 1  Copy design 1  Total score 30     6CIT Screen 06/06/2016  What Year? 0 points  What month? 0 points  What time? 0 points    Count back from 20 0 points  Months in reverse 0 points  Repeat phrase 0 points  Total Score 0    Screening Tests Health Maintenance  Topic Date Due  . TETANUS/TDAP  11/29/1949  . ZOSTAVAX  11/30/1990  . DEXA SCAN  11/30/1995  . PNA vac Low Risk Adult (2 of 2 - PPSV23) 07/04/2014  . URINE MICROALBUMIN  03/07/2016  . OPHTHALMOLOGY EXAM  04/04/2016  . MAMMOGRAM  08/19/2016 (Originally 08/10/2014)  . INFLUENZA VACCINE  06/06/2017 (Originally 02/26/2016)  . FOOT EXAM  06/26/2016  . HEMOGLOBIN A1C  08/15/2016      Plan:  End of life planning; Advance aging; Advanced directives discussed. Copy of current HCPOA/Living Will requested.  Medicare Attestation I have personally reviewed: The patient's medical and social history Their use of alcohol, tobacco or illicit drugs Their current medications and supplements The patient's functional ability including ADLs,fall risks, home safety risks, cognitive, and hearing and visual impairment Diet and physical activities Evidence for depression   The patient's weight, height, BMI, and visual acuity have been recorded in the chart.  I have made referrals and provided education to the patient based on review of the above and I have provided the patient with a written personalized care plan for preventive services.    During the course of the visit, Shannon Mckinney was educated and counseled about the following appropriate screening and preventive services:   Vaccines to include Pneumoccal, Influenza, Hepatitis B, Td, Zostavax, HCV  Electrocardiogram  Cardiovascular disease screening  Colorectal cancer screening  Bone density screening  Diabetes screening  Glaucoma screening  Mammography/PAP  Nutrition counseling  Smoking cessation counseling  Patient Instructions (the written plan) were given to the patient.    Varney Biles, LPN   [id number]   Reviewed above information.  Agree with plan.    Dr Nicki Reaper

## 2016-06-06 NOTE — Progress Notes (Signed)
Patient ID: Shannon Mckinney, female   DOB: [birth year], 82 y.o.   MRN: [id number]   Subjective:    Patient ID: Shannon Mckinney, female    DOB: [birth year], 82 y.o.   MRN: [id number]  HPI  Patient here for a scheduled follow up.  She reports she started having increased nasal congestion over the last week.  Some drainage.  No chest congestion.  Has been taking some otc cold medication.  Is feeling better.  Stopped pravastatin one month ago.  Aching better.  Feels better.  Wants to remain off cholesterol medication.  Tries to stay active.  No chest pain.  Breathing stable.  No nausea or vomiting.  Bowels stable.     Past Medical History:  Diagnosis Date  . Arthritis   . Benign head tremor   . Diabetes mellitus ([place])    diet controlled  . Fibrocystic breast disease   . Heart murmur   . History of abnormal Pap smear   . Hypercholesterolemia   . Hyperthyroidism    s/p ablation   Past Surgical History:  Procedure Laterality Date  . BREAST BIOPSY  70's   benign  . CARPAL TUNNEL RELEASE     right hand  . KNEE ARTHROSCOPY Left 09/12/2015   Procedure: ARTHROSCOPY LEFT KNEE, PARTIAL MEDIAL MENISECTOMY, PARTIAL LATERAL MENISECTOMY, CHONDROPLASTY MEDIAL AND LATERAL;  Surgeon: Dereck Leep, MD;  Location: [facility] ORS;  Service: Orthopedics;  Laterality: Left;  . SHOULDER ARTHROSCOPY    . TONSILLECTOMY     Family History  Problem Relation Age of Onset  . Heart disease Father     myocardial infarction - died 44  . Diabetes Father   . Heart disease Mother     myocardial infarction  . Heart disease Brother     x2  . Diabetes Brother   . Diabetes Brother     x2  . Diabetes Sister   . Breast cancer Neg Hx   . Colon cancer Neg Hx    Social History   Social History  . Marital status: Married    Spouse name: N/A  . Number of children: 2  . Years of education: N/A   Social History Main Topics  . Smoking status: Never Smoker  . Smokeless tobacco: Never Used  . Alcohol use 0.0 oz/week   Comment: occasional  . Drug use: No  . Sexual activity: Not Currently   Other Topics Concern  . None   Social History Narrative  . None    Outpatient Encounter Prescriptions as of 06/06/2016  Medication Sig  . cholecalciferol (VITAMIN D) 1000 units tablet Take 1,000 Units by mouth daily.  Marland Kitchen SYNTHROID 88 MCG tablet TAKE 1 TABLET BY MOUTH DAILY BEFORE BREAKFAST  . [DISCONTINUED] pravastatin (PRAVACHOL) 10 MG tablet TAKE ONE TABLET BY MOUTH EVERY DAY (Patient not taking: Reported on 06/06/2016)   No facility-administered encounter medications on file as of 06/06/2016.     Review of Systems  Constitutional: Negative for appetite change and unexpected weight change.  HENT: Positive for congestion and postnasal drip.   Respiratory: Negative for cough, chest tightness and shortness of breath.   Cardiovascular: Negative for chest pain, palpitations and leg swelling.  Gastrointestinal: Negative for abdominal pain, diarrhea, nausea and vomiting.  Musculoskeletal: Negative for back pain.       Aching better since being off cholesterol medication.    Skin: Negative for color change and rash.  Neurological: Negative for dizziness, light-headedness and headaches.  Psychiatric/Behavioral: Negative for agitation  and dysphoric mood.       Objective:     Blood pressure rechecked by me:  132/78  Physical Exam  Constitutional: She appears well-developed and well-nourished. No distress.  HENT:  Nose: Nose normal.  Mouth/Throat: Oropharynx is clear and moist.  Neck: Neck supple. No thyromegaly present.  Cardiovascular: Normal rate and regular rhythm.   Pulmonary/Chest: Breath sounds normal. No respiratory distress. She has no wheezes.  Abdominal: Soft. Bowel sounds are normal. There is no tenderness.  Musculoskeletal: She exhibits no edema or tenderness.  Lymphadenopathy:    She has no cervical adenopathy.  Skin: No rash noted. No erythema.  Psychiatric: She has a normal mood and affect.  Her behavior is normal.    BP (!) 144/68   Pulse 83   Temp 98 F (36.7 C) (Oral)   Ht '5\' 3"'  (1.6 m)   Wt 139 lb 9.6 oz (63.3 kg)   SpO2 93%   BMI 24.73 kg/m  Wt Readings from Last 3 Encounters:  06/06/16 139 lb 9.6 oz (63.3 kg)  02/19/16 138 lb (62.6 kg)  09/12/15 145 lb (65.8 kg)     Lab Results  Component Value Date   WBC 7.7 09/04/2015   HGB 13.3 09/04/2015   HCT 39.5 09/04/2015   PLT 181 09/04/2015   GLUCOSE 111 (H) 02/13/2016   CHOL 204 (H) 02/13/2016   TRIG 119.0 02/13/2016   HDL 62.90 02/13/2016   LDLDIRECT 130.6 06/22/2013   LDLCALC 117 (H) 02/13/2016   ALT 13 02/13/2016   AST 14 02/13/2016   NA 143 02/13/2016   K 4.1 02/13/2016   CL 102 02/13/2016   CREATININE 0.68 02/13/2016   BUN 15 02/13/2016   CO2 32 02/13/2016   TSH 0.41 02/13/2016   HGBA1C 6.3 02/13/2016   MICROALBUR 5.1 (H) 03/08/2015    US Thyroid  Result Date: 11/12/2015 CLINICAL DATA:  Right-sided thyroid nodule. History of thyroid ablation many years ago. EXAM: THYROID ULTRASOUND TECHNIQUE: Ultrasound examination of the thyroid gland and adjacent soft tissues was performed. COMPARISON:  Thyroid ultrasound - 05/11/2015 FINDINGS: There is diffuse heterogeneity of the thyroid parenchymal echotexture. No new or enlarging thyroid nodules for Right thyroid lobe Measurements: Diminutive in size measuring 1.5 x 0.7 x 0.8 cm. Right, mid - 0.6 x 0.2 x 0.5 cm - hypoechoic, solid unchanged to decreased in size in interval, previously, 0.5 x 0.6 x 0.5 cm Left thyroid lobe Measurements: Diminutive in size measure 1.5 x 0.5 x 0.5 cm There is diffuse heterogeneity of the left lobe of the thyroid without discrete nodule or mass. Isthmus Thickness: Normal in size measures 0.2 cm in diameter. No discrete nodule or mass is identified within the thyroid isthmus. Lymphadenopathy None visualized. IMPRESSION: 1. Similar findings of atrophic and heterogeneous appearing thyroid gland compatible with provided history of remote  thyroid ablation. 2. Solitary punctate (approximately 0.6 cm) nodule within the right lobe of the thyroid is unchanged to slightly decreased in size since the 04/2015 examination. No new or enlarging thyroid nodules. Electronically Signed   By: Sandi Mariscal M.D.   On: 11/12/2015 14:08       Assessment & Plan:   Problem List Items Addressed This Visit    Diabetes mellitus ([place])    Follow met b and a1c.       Relevant Orders   CBC with Differential/Platelet   Basic metabolic panel   Hemoglobin A1c   Hypercholesterolemia    Off cholesterol medication.  Feels better.  Low cholesterol  diet and exercise.  Follow lipid panel.        Relevant Orders   Hepatic function panel   Lipid panel   Hyperthyroidism    S/p ablation.  Maintained on synthroid.  Follow tsh.        Loss of weight    Weight improved.  Eating well.  Appetite good.  Follow.       Nasal congestion    Better.  Discussed saline nasal spray and nasacort nasal spray.  Follow.        Thyroid nodule    Being followed by Dr Eddie Dibbles.  Ultrasound 10/2015.  Continue fu with Dr Eddie Dibbles.      Relevant Orders   TSH   Vitamin D deficiency    Check vitamin D level.        Relevant Orders   VITAMIN D 25 Hydroxy (Vit-D Deficiency, Fractures)       Einar Pheasant, MD

## 2016-06-06 NOTE — Patient Instructions (Addendum)
  [name] , Thank you for taking time to come for your Medicare Wellness Visit. I appreciate your ongoing commitment to your health goals. Please review the following plan we discussed and let me know if I can assist you in the future.   These are the goals we discussed: Goals    . Healthy Lifestyle          Maintain drinking plenty of fluids, exercise regimen and low carb foods.       This is a list of the screening recommended for you and due dates:  Health Maintenance  Topic Date Due  . Tetanus Vaccine  11/29/1949  . Shingles Vaccine  11/30/1990  . DEXA scan (bone density measurement)  11/30/1995  . Pneumonia vaccines (2 of 2 - PPSV23) 07/04/2014  . Urine Protein Check  03/07/2016  . Eye exam for diabetics  04/04/2016  . Mammogram  08/19/2016*  . Flu Shot  06/06/2017*  . Complete foot exam   06/26/2016  . Hemoglobin A1C  08/15/2016  *Topic was postponed. The date shown is not the original due date.

## 2016-06-08 ENCOUNTER — Encounter: Payer: Self-pay | Admitting: Internal Medicine

## 2016-06-08 DIAGNOSIS — R0981 Nasal congestion: Secondary | ICD-10-CM | POA: Insufficient documentation

## 2016-06-08 DIAGNOSIS — E559 Vitamin D deficiency, unspecified: Secondary | ICD-10-CM | POA: Insufficient documentation

## 2016-06-08 NOTE — Assessment & Plan Note (Signed)
Weight improved.  Eating well.  Appetite good.  Follow.

## 2016-06-08 NOTE — Assessment & Plan Note (Signed)
Better.  Discussed saline nasal spray and nasacort nasal spray.  Follow.

## 2016-06-08 NOTE — Assessment & Plan Note (Signed)
Being followed by Dr Eddie Dibbles.  Ultrasound 10/2015.  Continue fu with Dr Eddie Dibbles.

## 2016-06-08 NOTE — Assessment & Plan Note (Signed)
S/p ablation.  Maintained on synthroid.  Follow tsh.   

## 2016-06-08 NOTE — Assessment & Plan Note (Signed)
Off cholesterol medication.  Feels better.  Low cholesterol diet and exercise.  Follow lipid panel.

## 2016-06-08 NOTE — Assessment & Plan Note (Signed)
Check vitamin D level 

## 2016-06-08 NOTE — Assessment & Plan Note (Signed)
Follow met b and a1c.  

## 2016-06-10 ENCOUNTER — Ambulatory Visit: Payer: BLUE CROSS/BLUE SHIELD

## 2016-06-23 DIAGNOSIS — C4402 Squamous cell carcinoma of skin of lip: Secondary | ICD-10-CM | POA: Diagnosis not present

## 2016-06-23 DIAGNOSIS — L578 Other skin changes due to chronic exposure to nonionizing radiation: Secondary | ICD-10-CM | POA: Diagnosis not present

## 2016-06-23 DIAGNOSIS — Z85828 Personal history of other malignant neoplasm of skin: Secondary | ICD-10-CM | POA: Diagnosis not present

## 2016-06-23 DIAGNOSIS — D485 Neoplasm of uncertain behavior of skin: Secondary | ICD-10-CM | POA: Diagnosis not present

## 2016-06-24 DIAGNOSIS — Z23 Encounter for immunization: Secondary | ICD-10-CM | POA: Diagnosis not present

## 2016-07-16 ENCOUNTER — Other Ambulatory Visit: Payer: Self-pay | Admitting: Internal Medicine

## 2016-08-04 DIAGNOSIS — C4402 Squamous cell carcinoma of skin of lip: Secondary | ICD-10-CM | POA: Diagnosis not present

## 2016-08-08 DIAGNOSIS — C44329 Squamous cell carcinoma of skin of other parts of face: Secondary | ICD-10-CM | POA: Diagnosis not present

## 2016-09-09 ENCOUNTER — Other Ambulatory Visit (INDEPENDENT_AMBULATORY_CARE_PROVIDER_SITE_OTHER): Payer: Medicare Other

## 2016-09-09 DIAGNOSIS — E7800 Pure hypercholesterolemia, unspecified: Secondary | ICD-10-CM | POA: Diagnosis not present

## 2016-09-09 DIAGNOSIS — E041 Nontoxic single thyroid nodule: Secondary | ICD-10-CM | POA: Diagnosis not present

## 2016-09-09 DIAGNOSIS — E119 Type 2 diabetes mellitus without complications: Secondary | ICD-10-CM | POA: Diagnosis not present

## 2016-09-09 DIAGNOSIS — E559 Vitamin D deficiency, unspecified: Secondary | ICD-10-CM

## 2016-09-09 LAB — LIPID PANEL
CHOL/HDL RATIO: 4
CHOLESTEROL: 283 mg/dL — AB (ref 0–200)
HDL: 76.4 mg/dL (ref >39.00)
LDL CALC: 173 mg/dL — AB (ref 0–99)
NonHDL: 207.08
Triglycerides: 169 mg/dL — ABNORMAL HIGH (ref 0.0–149.0)
VLDL: 33.8 mg/dL (ref 0.0–40.0)

## 2016-09-09 LAB — HEPATIC FUNCTION PANEL
ALBUMIN: 4.6 g/dL (ref 3.5–5.2)
ALT: 12 U/L (ref 0–35)
AST: 15 U/L (ref 0–37)
Alkaline Phosphatase: 62 U/L (ref 39–117)
BILIRUBIN DIRECT: 0.1 mg/dL (ref 0.0–0.3)
TOTAL PROTEIN: 7 g/dL (ref 6.0–8.3)
Total Bilirubin: 0.9 mg/dL (ref 0.2–1.2)

## 2016-09-09 LAB — BASIC METABOLIC PANEL
BUN: 17 mg/dL (ref 6–23)
CHLORIDE: 99 meq/L (ref 96–112)
CO2: 35 meq/L — AB (ref 19–32)
CREATININE: 0.72 mg/dL (ref 0.40–1.20)
Calcium: 9.3 mg/dL (ref 8.4–10.5)
GFR: 81.67 mL/min (ref >60.00)
GLUCOSE: 128 mg/dL — AB (ref 70–99)
Potassium: 3.9 mEq/L (ref 3.5–5.1)
Sodium: 140 mEq/L (ref 135–145)

## 2016-09-09 LAB — CBC WITH DIFFERENTIAL/PLATELET
BASOS ABS: 0 10*3/uL (ref 0.0–0.1)
Basophils Relative: 0.5 % (ref 0.0–3.0)
EOS PCT: 2.4 % (ref 0.0–5.0)
Eosinophils Absolute: 0.1 10*3/uL (ref 0.0–0.7)
HEMATOCRIT: 42.1 % (ref 36.0–46.0)
HEMOGLOBIN: 14.1 g/dL (ref 12.0–15.0)
LYMPHS PCT: 31.7 % (ref 12.0–46.0)
Lymphs Abs: 1.9 10*3/uL (ref 0.7–4.0)
MCHC: 33.5 g/dL (ref 30.0–36.0)
MCV: 90.6 fl (ref 78.0–100.0)
MONOS PCT: 7.9 % (ref 3.0–12.0)
Monocytes Absolute: 0.5 10*3/uL (ref 0.1–1.0)
Neutro Abs: 3.4 10*3/uL (ref 1.4–7.7)
Neutrophils Relative %: 57.5 % (ref 43.0–77.0)
Platelets: 232 10*3/uL (ref 150.0–400.0)
RBC: 4.64 Mil/uL (ref 3.87–5.11)
RDW: 14.6 % (ref 11.5–15.5)
WBC: 5.8 10*3/uL (ref 4.0–10.5)

## 2016-09-09 LAB — TSH: TSH: 0.91 u[IU]/mL (ref 0.35–4.50)

## 2016-09-09 LAB — VITAMIN D 25 HYDROXY (VIT D DEFICIENCY, FRACTURES): VITD: 23.8 ng/mL — AB (ref 30.00–100.00)

## 2016-09-09 LAB — HEMOGLOBIN A1C: HEMOGLOBIN A1C: 6.3 % (ref 4.6–6.5)

## 2016-09-10 ENCOUNTER — Encounter: Payer: Self-pay | Admitting: Internal Medicine

## 2016-09-12 ENCOUNTER — Encounter: Payer: Self-pay | Admitting: Internal Medicine

## 2016-09-12 ENCOUNTER — Ambulatory Visit (INDEPENDENT_AMBULATORY_CARE_PROVIDER_SITE_OTHER): Payer: Medicare Other | Admitting: Internal Medicine

## 2016-09-12 VITALS — BP 134/64 | HR 70 | Temp 98.6°F | Resp 16 | Ht 63.0 in | Wt 143.0 lb

## 2016-09-12 DIAGNOSIS — Z23 Encounter for immunization: Secondary | ICD-10-CM

## 2016-09-12 DIAGNOSIS — E041 Nontoxic single thyroid nodule: Secondary | ICD-10-CM | POA: Diagnosis not present

## 2016-09-12 DIAGNOSIS — E7800 Pure hypercholesterolemia, unspecified: Secondary | ICD-10-CM | POA: Diagnosis not present

## 2016-09-12 DIAGNOSIS — M8580 Other specified disorders of bone density and structure, unspecified site: Secondary | ICD-10-CM

## 2016-09-12 DIAGNOSIS — E119 Type 2 diabetes mellitus without complications: Secondary | ICD-10-CM

## 2016-09-12 DIAGNOSIS — E559 Vitamin D deficiency, unspecified: Secondary | ICD-10-CM

## 2016-09-12 NOTE — Progress Notes (Signed)
Pre-visit discussion using our clinic review tool. No additional management support is needed unless otherwise documented below in the visit note.  

## 2016-09-12 NOTE — Progress Notes (Signed)
Patient ID: Shannon Mckinney, female   DOB: [birth year], 83 y.o.   MRN: [id number]   Subjective:    Patient ID: Shannon Mckinney, female    DOB: [birth year], 83 y.o.   MRN: [id number]  HPI  Patient with past history of hypercholesterolemia and diabetes.  She comes in today to f/u on these issues as well as for a complete physical exam.  She reports she is doing well.  No chest pain.  No sob.  No acid reflux.  No abdominal pain or cramping.  Bowels stable.     Past Medical History:  Diagnosis Date  . Arthritis   . Benign head tremor   . Diabetes mellitus ([place])    diet controlled  . Fibrocystic breast disease   . Heart murmur   . History of abnormal Pap smear   . Hypercholesterolemia   . Hyperthyroidism    s/p ablation   Past Surgical History:  Procedure Laterality Date  . BREAST BIOPSY  70's   benign  . CARPAL TUNNEL RELEASE     right hand  . KNEE ARTHROSCOPY Left 09/12/2015   Procedure: ARTHROSCOPY LEFT KNEE, PARTIAL MEDIAL MENISECTOMY, PARTIAL LATERAL MENISECTOMY, CHONDROPLASTY MEDIAL AND LATERAL;  Surgeon: Dereck Leep, MD;  Location: [facility] ORS;  Service: Orthopedics;  Laterality: Left;  . SHOULDER ARTHROSCOPY    . TONSILLECTOMY     Family History  Problem Relation Age of Onset  . Heart disease Father     myocardial infarction - died 35  . Diabetes Father   . Heart disease Mother     myocardial infarction  . Heart disease Brother     x2  . Diabetes Brother   . Diabetes Brother     x2  . Diabetes Sister   . Breast cancer Neg Hx   . Colon cancer Neg Hx    Social History   Social History  . Marital status: Married    Spouse name: N/A  . Number of children: 2  . Years of education: N/A   Social History Main Topics  . Smoking status: Never Smoker  . Smokeless tobacco: Never Used  . Alcohol use 0.0 oz/week     Comment: occasional  . Drug use: No  . Sexual activity: Not Currently   Other Topics Concern  . None   Social History Narrative  . None    Outpatient  Encounter Prescriptions as of 09/12/2016  Medication Sig  . cholecalciferol (VITAMIN D) 1000 units tablet Take 1,000 Units by mouth daily.  Marland Kitchen SYNTHROID 88 MCG tablet TAKE 1 TABLET BY MOUTH EVERY DAY BEFORE BREAKFAST   No facility-administered encounter medications on file as of 09/12/2016.     Review of Systems  Constitutional: Negative for appetite change and unexpected weight change.  HENT: Negative for congestion and sinus pressure.   Eyes: Negative for pain and visual disturbance.  Respiratory: Negative for cough, chest tightness and shortness of breath.   Cardiovascular: Negative for chest pain, palpitations and leg swelling.  Gastrointestinal: Negative for abdominal pain, diarrhea, nausea and vomiting.  Genitourinary: Negative for difficulty urinating and dysuria.  Musculoskeletal: Negative for back pain and joint swelling.  Skin: Negative for color change and rash.  Neurological: Negative for dizziness, light-headedness and headaches.  Hematological: Negative for adenopathy. Does not bruise/bleed easily.  Psychiatric/Behavioral: Negative for agitation and dysphoric mood.       Objective:    Physical Exam  Constitutional: She is oriented to person, place, and time. She appears well-developed and  well-nourished. No distress.  HENT:  Nose: Nose normal.  Mouth/Throat: Oropharynx is clear and moist.  Eyes: Right eye exhibits no discharge. Left eye exhibits no discharge. No scleral icterus.  Neck: Neck supple. No thyromegaly present.  Cardiovascular: Normal rate and regular rhythm.   Pulmonary/Chest: Breath sounds normal. No accessory muscle usage. No tachypnea. No respiratory distress. She has no decreased breath sounds. She has no wheezes. She has no rhonchi. Right breast exhibits no inverted nipple, no mass, no nipple discharge and no tenderness (no axillary adenopathy). Left breast exhibits no inverted nipple, no mass, no nipple discharge and no tenderness (no axilarry  adenopathy).  Abdominal: Soft. Bowel sounds are normal. There is no tenderness.  Musculoskeletal: She exhibits no edema or tenderness.  Lymphadenopathy:    She has no cervical adenopathy.  Neurological: She is alert and oriented to person, place, and time.  Skin: Skin is warm. No rash noted. No erythema.  Psychiatric: She has a normal mood and affect. Her behavior is normal.    BP 134/64 (BP Location: Left Arm, Patient Position: Sitting, Cuff Size: Large)   Pulse 70   Temp 98.6 F (37 C) (Oral)   Resp 16   Ht 5' 3" (1.6 m)   Wt 143 lb (64.9 kg)   SpO2 95%   BMI 25.33 kg/m  Wt Readings from Last 3 Encounters:  09/12/16 143 lb (64.9 kg)  06/06/16 139 lb 9.6 oz (63.3 kg)  02/19/16 138 lb (62.6 kg)     Lab Results  Component Value Date   WBC 5.8 09/09/2016   HGB 14.1 09/09/2016   HCT 42.1 09/09/2016   PLT 232.0 09/09/2016   GLUCOSE 128 (H) 09/09/2016   CHOL 283 (H) 09/09/2016   TRIG 169.0 (H) 09/09/2016   HDL 76.40 09/09/2016   LDLDIRECT 130.6 06/22/2013   LDLCALC 173 (H) 09/09/2016   ALT 12 09/09/2016   AST 15 09/09/2016   NA 140 09/09/2016   K 3.9 09/09/2016   CL 99 09/09/2016   CREATININE 0.72 09/09/2016   BUN 17 09/09/2016   CO2 35 (H) 09/09/2016   TSH 0.91 09/09/2016   HGBA1C 6.3 09/09/2016   MICROALBUR 5.1 (H) 03/08/2015    US Thyroid  Result Date: 11/12/2015 CLINICAL DATA:  Right-sided thyroid nodule. History of thyroid ablation many years ago. EXAM: THYROID ULTRASOUND TECHNIQUE: Ultrasound examination of the thyroid gland and adjacent soft tissues was performed. COMPARISON:  Thyroid ultrasound - 05/11/2015 FINDINGS: There is diffuse heterogeneity of the thyroid parenchymal echotexture. No new or enlarging thyroid nodules for Right thyroid lobe Measurements: Diminutive in size measuring 1.5 x 0.7 x 0.8 cm. Right, mid - 0.6 x 0.2 x 0.5 cm - hypoechoic, solid unchanged to decreased in size in interval, previously, 0.5 x 0.6 x 0.5 cm Left thyroid lobe  Measurements: Diminutive in size measure 1.5 x 0.5 x 0.5 cm There is diffuse heterogeneity of the left lobe of the thyroid without discrete nodule or mass. Isthmus Thickness: Normal in size measures 0.2 cm in diameter. No discrete nodule or mass is identified within the thyroid isthmus. Lymphadenopathy None visualized. IMPRESSION: 1. Similar findings of atrophic and heterogeneous appearing thyroid gland compatible with provided history of remote thyroid ablation. 2. Solitary punctate (approximately 0.6 cm) nodule within the right lobe of the thyroid is unchanged to slightly decreased in size since the 04/2015 examination. No new or enlarging thyroid nodules. Electronically Signed   By: Sandi Mariscal M.D.   On: 11/12/2015 14:08  Assessment & Plan:   Problem List Items Addressed This Visit    Diabetes mellitus ([place])    Low carb diet and exercise.  Follow met b and a1c.       Relevant Orders   Hemoglobin X1G   Basic metabolic panel   Hypercholesterolemia    Low cholesterol diet and exercise.  Follow lipid panel.  She declines cholesterol medication.        Relevant Orders   Hepatic function panel   Lipid panel   Osteopenia    Continue vitamin D and weight bearing exercise.  Follow.        Thyroid nodule    Being followed by Dr Eddie Dibbles.  Ultrasound 10/2015.        Vitamin D deficiency    On vitamin D.  Increase to 2000 units per day.  Follow.         Other Visit Diagnoses    Need for vaccination    -  Primary   Relevant Orders   Pneumococcal polysaccharide vaccine 23-valent greater than or equal to 2yo subcutaneous/IM (Completed)       Einar Pheasant, MD

## 2016-09-13 ENCOUNTER — Encounter: Payer: Self-pay | Admitting: Internal Medicine

## 2016-09-13 NOTE — Assessment & Plan Note (Signed)
Continue vitamin D and weight bearing exercise.  Follow.

## 2016-09-13 NOTE — Assessment & Plan Note (Signed)
Low carb diet and exercise.  Follow met b and a1c.  

## 2016-09-13 NOTE — Assessment & Plan Note (Addendum)
Low cholesterol diet and exercise.  Follow lipid panel.  She declines cholesterol medication.

## 2016-09-13 NOTE — Assessment & Plan Note (Signed)
On vitamin D.  Increase to 2000 units per day.  Follow.

## 2016-09-13 NOTE — Assessment & Plan Note (Signed)
Being followed by Dr Eddie Dibbles.  Ultrasound 10/2015.

## 2016-09-15 ENCOUNTER — Telehealth: Payer: Self-pay | Admitting: Internal Medicine

## 2016-09-15 NOTE — Telephone Encounter (Signed)
PT pravastatin was discontinued on 06/08/16, still discontinued?

## 2016-09-16 ENCOUNTER — Other Ambulatory Visit: Payer: Self-pay | Admitting: Internal Medicine

## 2016-09-16 NOTE — Telephone Encounter (Signed)
She desires not to take cholesterol medication now.  No need for refill.

## 2016-09-16 NOTE — Telephone Encounter (Signed)
Please call [phone]

## 2016-09-16 NOTE — Telephone Encounter (Signed)
lmtrc with husband

## 2016-09-17 NOTE — Telephone Encounter (Signed)
She is no longer taking.

## 2016-09-17 NOTE — Telephone Encounter (Signed)
Looks like the pravastatin was discontinued in November. Please advise on refills.

## 2016-09-17 NOTE — Telephone Encounter (Signed)
LMTCB. Need to let pt know that this medication has been discontinued by Dr. Nicki Reaper.

## 2016-09-19 NOTE — Telephone Encounter (Signed)
Spoke with pt to make sure she knew that this medication had been discontinued and the pt stated that she did and was not taking it any longer. Also called the pt's pharmacy to let them know that this medication has been discontinued.

## 2016-09-24 ENCOUNTER — Encounter: Payer: Self-pay | Admitting: Family

## 2016-09-24 ENCOUNTER — Ambulatory Visit (INDEPENDENT_AMBULATORY_CARE_PROVIDER_SITE_OTHER): Payer: Medicare Other | Admitting: Family

## 2016-09-24 VITALS — BP 120/62 | HR 77 | Temp 98.7°F | Ht 63.0 in | Wt 139.8 lb

## 2016-09-24 DIAGNOSIS — R0981 Nasal congestion: Secondary | ICD-10-CM

## 2016-09-24 MED ORDER — DOXYCYCLINE HYCLATE 100 MG PO TABS: 100.0000 mg | ORAL_TABLET | Freq: Two times a day (BID) | ORAL | 0 refills | Status: DC

## 2016-09-24 NOTE — Progress Notes (Signed)
Subjective:    Patient ID: Shannon Mckinney, female    DOB: July 08, 1929, 83 y.o.   MRN: [id number]  CC: Shannon Mckinney is a 83 y.o. female who presents today for an acute visit.    HPI: CC; cough, sinus congestion x 1 week, unchanged. Endorses nasal drainage.  Tried cold eaze with some relief.  No wheezing, sob.   No lung disease. No smoking    HISTORY:  Past Medical History:  Diagnosis Date  . Arthritis   . Benign head tremor   . Diabetes mellitus ([place])    diet controlled  . Fibrocystic breast disease   . Heart murmur   . History of abnormal Pap smear   . Hypercholesterolemia   . Hyperthyroidism    s/p ablation   Past Surgical History:  Procedure Laterality Date  . BREAST BIOPSY  70's   benign  . CARPAL TUNNEL RELEASE     right hand  . KNEE ARTHROSCOPY Left 09/12/2015   Procedure: ARTHROSCOPY LEFT KNEE, PARTIAL MEDIAL MENISECTOMY, PARTIAL LATERAL MENISECTOMY, CHONDROPLASTY MEDIAL AND LATERAL;  Surgeon: Dereck Leep, MD;  Location: [facility] ORS;  Service: Orthopedics;  Laterality: Left;  . SHOULDER ARTHROSCOPY    . TONSILLECTOMY     Family History  Problem Relation Age of Onset  . Heart disease Father     myocardial infarction - died 67  . Diabetes Father   . Heart disease Mother     myocardial infarction  . Heart disease Brother     x2  . Diabetes Brother   . Diabetes Brother     x2  . Diabetes Sister   . Breast cancer Neg Hx   . Colon cancer Neg Hx     Allergies: Mysoline [primidone] and Penicillins Current Outpatient Prescriptions on File Prior to Visit  Medication Sig Dispense Refill  . cholecalciferol (VITAMIN D) 1000 units tablet Take 1,000 Units by mouth daily.    Marland Kitchen SYNTHROID 88 MCG tablet TAKE 1 TABLET BY MOUTH EVERY DAY BEFORE BREAKFAST 30 tablet 3   No current facility-administered medications on file prior to visit.     Social History  Substance Use Topics  . Smoking status: Never Smoker  . Smokeless tobacco: Never Used  . Alcohol use 0.0 oz/week       Comment: occasional    Review of Systems  Constitutional: Negative for chills and fever.  HENT: Positive for congestion, postnasal drip and sinus pressure. Negative for ear pain and sore throat.   Respiratory: Positive for cough. Negative for shortness of breath and wheezing.   Cardiovascular: Negative for chest pain and palpitations.  Gastrointestinal: Negative for nausea and vomiting.      Objective:    BP 120/62   Pulse 77   Temp 98.7 F (37.1 C) (Oral)   Ht '5\' 3"'$  (1.6 m)   Wt 139 lb 12.8 oz (63.4 kg)   SpO2 93%   BMI 24.76 kg/m    Physical Exam  Constitutional: She appears well-developed and well-nourished.  HENT:  Head: Normocephalic and atraumatic.  Right Ear: Hearing, tympanic membrane, external ear and ear canal normal. No drainage, swelling or tenderness. No foreign bodies. Tympanic membrane is not erythematous and not bulging. No middle ear effusion. No decreased hearing is noted.  Left Ear: Hearing, tympanic membrane, external ear and ear canal normal. No drainage, swelling or tenderness. No foreign bodies. Tympanic membrane is not erythematous and not bulging.  No middle ear effusion. No decreased hearing is noted.  Nose:  Nose normal. No rhinorrhea. Right sinus exhibits no maxillary sinus tenderness and no frontal sinus tenderness. Left sinus exhibits no maxillary sinus tenderness and no frontal sinus tenderness.  Mouth/Throat: Uvula is midline, oropharynx is clear and moist and mucous membranes are normal. No oropharyngeal exudate, posterior oropharyngeal edema, posterior oropharyngeal erythema or tonsillar abscesses.  Eyes: Conjunctivae are normal.  Cardiovascular: Regular rhythm, normal heart sounds and normal pulses.   Pulmonary/Chest: Effort normal and breath sounds normal. She has no wheezes. She has no rhonchi. She has no rales.  Lymphadenopathy:       Head (right side): No submental, no submandibular, no tonsillar, no preauricular, no posterior auricular  and no occipital adenopathy present.       Head (left side): No submental, no submandibular, no tonsillar, no preauricular, no posterior auricular and no occipital adenopathy present.    She has no cervical adenopathy.  Neurological: She is alert.  Skin: Skin is warm and dry.  Psychiatric: She has a normal mood and affect. Her speech is normal and behavior is normal. Thought content normal.  Vitals reviewed.      Assessment & Plan:   1. Sinus congestion Afebrile. No acute respiratory distress. Patient and I jointly decided likely viral etiology. Patient  Will try Mucinex . If no improvement, she understands to fill antibiotic. - doxycycline (VIBRA-TABS) 100 MG tablet; Take 1 tablet (100 mg total) by mouth 2 (two) times daily.  Dispense: 10 tablet; Refill: 0    I am having Ms. Mcshan start on doxycycline. I am also having her maintain her cholecalciferol and SYNTHROID.   Meds ordered this encounter  Medications  . doxycycline (VIBRA-TABS) 100 MG tablet    Sig: Take 1 tablet (100 mg total) by mouth 2 (two) times daily.    Dispense:  10 tablet    Refill:  0    Order Specific Question:   Supervising Provider    Answer:   Crecencio Mc [[id number]]    Return precautions given.   Risks, benefits, and alternatives of the medications and treatment plan prescribed today were discussed, and patient expressed understanding.   Education regarding symptom management and diagnosis given to patient on AVS.  Continue to follow with Einar Pheasant, MD for routine health maintenance.   Azalia Bilis and I agreed with plan.   Mable Paris, FNP

## 2016-09-24 NOTE — Patient Instructions (Signed)
Try [facility] FIRST  I suspect that your infection is viral in nature.  As discussed, I advise that you wait to fill the antibiotic after 1-2 days of symptom management to see if your symptoms improve. If you do not show improvement, you may take the antibiotic as prescribed.   Increase intake of clear fluids. Congestion is best treated by hydration, when mucus is wetter, it is thinner, less sticky, and easier to expel from the body, either through coughing up drainage, or by blowing your nose.   Get plenty of rest.   Use saline nasal drops and blow your nose frequently. Run a humidifier at night and elevate the head of the bed. Vicks Vapor rub will help with congestion and cough. Steam showers and sinus massage for congestion.   Use Acetaminophen or Ibuprofen as needed for fever or pain. Avoid second hand smoke. Even the smallest exposure will worsen symptoms.   Over the counter medications you can try include Delsym for cough, a decongestant for congestion, and Mucinex or Robitussin as an expectorant. Be sure to just get the plain Mucinex or Robitussin that just has one medication (Guaifenesen). We don't recommend the combination products. Note, be sure to drink two glasses of water with each dose of Mucinex as the medication will not work well without adequate hydration.   You can also try a teaspoon of honey to see if this will help reduce cough. Throat lozenges can sometimes be beneficial as well.    This illness will typically last 7 - 10 days.   Please follow up with our clinic if you develop a fever greater than 101 F, symptoms worsen, or do not resolve in the next week.

## 2016-09-24 NOTE — Progress Notes (Signed)
Pre visit review using our clinic review tool, if applicable. No additional management support is needed unless otherwise documented below in the visit note. 

## 2016-09-25 ENCOUNTER — Encounter: Payer: Self-pay | Admitting: Family

## 2016-10-02 ENCOUNTER — Telehealth: Payer: Self-pay | Admitting: Internal Medicine

## 2016-10-02 NOTE — Telephone Encounter (Signed)
Pt husband called about pt still not getting better. Still has dry cough and feeling really bad. Husband does not know what else to do. Today is the last day with taking the antibiotic. Please advise?  Call husband @ [phone]. Thank you!

## 2016-10-02 NOTE — Telephone Encounter (Signed)
Patients husband stated that him and his wife are gonna wait to see if she gets better the next couple days after antibiotics are done.  Patients husband stated that if oatient is not better or she worsens they will call to schedule appointment.

## 2016-10-23 DIAGNOSIS — H903 Sensorineural hearing loss, bilateral: Secondary | ICD-10-CM | POA: Diagnosis not present

## 2016-11-13 ENCOUNTER — Other Ambulatory Visit: Payer: Self-pay | Admitting: Internal Medicine

## 2016-12-30 DIAGNOSIS — E119 Type 2 diabetes mellitus without complications: Secondary | ICD-10-CM | POA: Diagnosis not present

## 2016-12-30 DIAGNOSIS — H35371 Puckering of macula, right eye: Secondary | ICD-10-CM | POA: Diagnosis not present

## 2017-01-01 LAB — HM DIABETES EYE EXAM

## 2017-01-26 DIAGNOSIS — D1800 Hemangioma unspecified site: Secondary | ICD-10-CM | POA: Diagnosis not present

## 2017-01-26 DIAGNOSIS — D229 Melanocytic nevi, unspecified: Secondary | ICD-10-CM | POA: Diagnosis not present

## 2017-01-26 DIAGNOSIS — C44619 Basal cell carcinoma of skin of left upper limb, including shoulder: Secondary | ICD-10-CM | POA: Diagnosis not present

## 2017-01-26 DIAGNOSIS — Z85828 Personal history of other malignant neoplasm of skin: Secondary | ICD-10-CM | POA: Diagnosis not present

## 2017-01-26 DIAGNOSIS — L821 Other seborrheic keratosis: Secondary | ICD-10-CM | POA: Diagnosis not present

## 2017-01-26 DIAGNOSIS — Z1283 Encounter for screening for malignant neoplasm of skin: Secondary | ICD-10-CM | POA: Diagnosis not present

## 2017-02-13 ENCOUNTER — Ambulatory Visit (INDEPENDENT_AMBULATORY_CARE_PROVIDER_SITE_OTHER): Payer: Medicare Other

## 2017-02-13 ENCOUNTER — Ambulatory Visit (INDEPENDENT_AMBULATORY_CARE_PROVIDER_SITE_OTHER): Payer: Medicare Other | Admitting: Family Medicine

## 2017-02-13 ENCOUNTER — Encounter: Payer: Self-pay | Admitting: Family Medicine

## 2017-02-13 VITALS — BP 124/78 | HR 80 | Temp 98.3°F | Wt 133.6 lb

## 2017-02-13 DIAGNOSIS — R059 Cough, unspecified: Secondary | ICD-10-CM | POA: Insufficient documentation

## 2017-02-13 DIAGNOSIS — R05 Cough: Secondary | ICD-10-CM

## 2017-02-13 NOTE — Assessment & Plan Note (Signed)
New acute problem. Given age and exposure, chest x-ray was obtained and was unremarkable for acute findings. Supportive care with over-the-counter cough medication if desired.

## 2017-02-13 NOTE — Progress Notes (Signed)
   Subjective:  Patient ID: Shannon Mckinney, female    DOB: Apr 19, 1929  Age: 83 y.o. MRN: [id number]  CC: Cough  HPI:  83 year old female presents with complaints of cough.  Patient reports that she's had cough for the past several days. No fever. Mildly productive. No associated shortness of breath. Associated fatigue. She's had a recent sick contact in her husband who has had pneumonia. She is concerned that she may be developing pneumonia. No medications or interventions tried. No other associated symptoms. No other complaints or concerns at this time.  Social Hx   Social History   Social History  . Marital status: Married    Spouse name: N/A  . Number of children: 2  . Years of education: N/A   Social History Main Topics  . Smoking status: Never Smoker  . Smokeless tobacco: Never Used  . Alcohol use 0.0 oz/week     Comment: occasional  . Drug use: No  . Sexual activity: Not Currently   Other Topics Concern  . None   Social History Narrative  . None    Review of Systems  Constitutional: Positive for fatigue.  Respiratory: Positive for cough. Negative for shortness of breath.    Objective:  BP 124/78   Pulse 80   Temp 98.3 F (36.8 C) (Oral)   Wt 133 lb 9.6 oz (60.6 kg)   SpO2 95%   BMI 23.67 kg/m   BP/Weight 02/13/2017 09/24/2016 3/81/0175  Systolic BP 102 585 277  Diastolic BP 78 62 64  Wt. (Lbs) 133.6 139.8 143  BMI 23.67 24.76 25.33    Physical Exam  Constitutional: She appears well-developed. No distress.  HENT:  Head: Normocephalic and atraumatic.  Mouth/Throat: Oropharynx is clear and moist.  Eyes: Conjunctivae are normal. No scleral icterus.  Neck: Neck supple.  Cardiovascular: Normal rate and regular rhythm.   No appreciable murmur on exam.  Pulmonary/Chest: Effort normal. She has no wheezes. She has no rales.  Lymphadenopathy:    She has no cervical adenopathy.  Neurological: She is alert.  Psychiatric: She has a normal mood and affect.    Vitals reviewed.   Lab Results  Component Value Date   WBC 5.8 09/09/2016   HGB 14.1 09/09/2016   HCT 42.1 09/09/2016   PLT 232.0 09/09/2016   GLUCOSE 128 (H) 09/09/2016   CHOL 283 (H) 09/09/2016   TRIG 169.0 (H) 09/09/2016   HDL 76.40 09/09/2016   LDLDIRECT 130.6 06/22/2013   LDLCALC 173 (H) 09/09/2016   ALT 12 09/09/2016   AST 15 09/09/2016   NA 140 09/09/2016   K 3.9 09/09/2016   CL 99 09/09/2016   CREATININE 0.72 09/09/2016   BUN 17 09/09/2016   CO2 35 (H) 09/09/2016   TSH 0.91 09/09/2016   HGBA1C 6.3 09/09/2016   MICROALBUR 5.1 (H) 03/08/2015    Assessment & Plan:   Problem List Items Addressed This Visit      Other   Cough - Primary    New acute problem. Given age and exposure, chest x-ray was obtained and was unremarkable for acute findings. Supportive care with over-the-counter cough medication if desired.      Relevant Orders   DG Chest 2 View (Completed)     Follow-up: PRN  [place]

## 2017-02-13 NOTE — Patient Instructions (Signed)
We will call with the results.  Take care  Dr. Lacinda Axon

## 2017-03-09 ENCOUNTER — Other Ambulatory Visit (INDEPENDENT_AMBULATORY_CARE_PROVIDER_SITE_OTHER): Payer: Medicare Other

## 2017-03-09 DIAGNOSIS — E119 Type 2 diabetes mellitus without complications: Secondary | ICD-10-CM | POA: Diagnosis not present

## 2017-03-09 DIAGNOSIS — E7800 Pure hypercholesterolemia, unspecified: Secondary | ICD-10-CM | POA: Diagnosis not present

## 2017-03-09 LAB — BASIC METABOLIC PANEL
BUN: 11 mg/dL (ref 6–23)
CALCIUM: 9.3 mg/dL (ref 8.4–10.5)
CO2: 32 mEq/L (ref 19–32)
Chloride: 100 mEq/L (ref 96–112)
Creatinine, Ser: 0.75 mg/dL (ref 0.40–1.20)
GFR: 77.82 mL/min (ref >60.00)
GLUCOSE: 125 mg/dL — AB (ref 70–99)
POTASSIUM: 3.7 meq/L (ref 3.5–5.1)
SODIUM: 140 meq/L (ref 135–145)

## 2017-03-09 LAB — HEPATIC FUNCTION PANEL
ALK PHOS: 61 U/L (ref 39–117)
ALT: 12 U/L (ref 0–35)
AST: 14 U/L (ref 0–37)
Albumin: 4.4 g/dL (ref 3.5–5.2)
BILIRUBIN DIRECT: 0.2 mg/dL (ref 0.0–0.3)
BILIRUBIN TOTAL: 0.8 mg/dL (ref 0.2–1.2)
TOTAL PROTEIN: 6.6 g/dL (ref 6.0–8.3)

## 2017-03-09 LAB — LIPID PANEL
CHOL/HDL RATIO: 4
Cholesterol: 247 mg/dL — ABNORMAL HIGH (ref 0–200)
HDL: 60.7 mg/dL (ref >39.00)
LDL CALC: 164 mg/dL — AB (ref 0–99)
NONHDL: 185.91
Triglycerides: 111 mg/dL (ref 0.0–149.0)
VLDL: 22.2 mg/dL (ref 0.0–40.0)

## 2017-03-09 LAB — HEMOGLOBIN A1C: Hgb A1c MFr Bld: 6.7 % — ABNORMAL HIGH (ref 4.6–6.5)

## 2017-03-10 ENCOUNTER — Encounter: Payer: Self-pay | Admitting: Internal Medicine

## 2017-03-13 ENCOUNTER — Encounter: Payer: Self-pay | Admitting: Internal Medicine

## 2017-03-13 ENCOUNTER — Ambulatory Visit (INDEPENDENT_AMBULATORY_CARE_PROVIDER_SITE_OTHER): Payer: Medicare Other | Admitting: Internal Medicine

## 2017-03-13 DIAGNOSIS — R05 Cough: Secondary | ICD-10-CM

## 2017-03-13 DIAGNOSIS — E041 Nontoxic single thyroid nodule: Secondary | ICD-10-CM | POA: Diagnosis not present

## 2017-03-13 DIAGNOSIS — E559 Vitamin D deficiency, unspecified: Secondary | ICD-10-CM | POA: Diagnosis not present

## 2017-03-13 DIAGNOSIS — E7800 Pure hypercholesterolemia, unspecified: Secondary | ICD-10-CM | POA: Diagnosis not present

## 2017-03-13 DIAGNOSIS — E0590 Thyrotoxicosis, unspecified without thyrotoxic crisis or storm: Secondary | ICD-10-CM | POA: Diagnosis not present

## 2017-03-13 DIAGNOSIS — R011 Cardiac murmur, unspecified: Secondary | ICD-10-CM

## 2017-03-13 DIAGNOSIS — E119 Type 2 diabetes mellitus without complications: Secondary | ICD-10-CM | POA: Diagnosis not present

## 2017-03-13 DIAGNOSIS — R059 Cough, unspecified: Secondary | ICD-10-CM

## 2017-03-13 MED ORDER — SYNTHROID 88 MCG PO TABS: ORAL_TABLET | ORAL | 5 refills | Status: DC

## 2017-03-13 NOTE — Progress Notes (Signed)
Patient ID: Shannon Mckinney, female   DOB: 1929-05-15, 83 y.o.   MRN: [id number]   Subjective:    Patient ID: Shannon Mckinney, female    DOB: June 23, 1929, 83 y.o.   MRN: [id number]  HPI  Patient here for a scheduled follow up.  She reports she is doing relatively well.  Her husband was diagnosed with pneumonia.  She developed increased cough.  Saw dr Lacinda Axon 02/13/17.  Note reviewed.  cxr obtained.  No acute abnormality.  See cxr report.  She states she feels better.  Still with some persistent cough.  No sob.  No wheezing.  No acid reflux.  No abdominal pain.  Bowels moving.  Discussed lab results.  Discussed a1c 6.7.  Discussed diabetes.  Low carb diet and exercises.  Discussed cholesterol results.  She declines cholesterol medication.     Past Medical History:  Diagnosis Date  . Arthritis   . Benign head tremor   . Diabetes mellitus ([place])    diet controlled  . Fibrocystic breast disease   . Heart murmur   . History of abnormal Pap smear   . Hypercholesterolemia   . Hyperthyroidism    s/p ablation   Past Surgical History:  Procedure Laterality Date  . BREAST BIOPSY  70's   benign  . CARPAL TUNNEL RELEASE     right hand  . KNEE ARTHROSCOPY Left 09/12/2015   Procedure: ARTHROSCOPY LEFT KNEE, PARTIAL MEDIAL MENISECTOMY, PARTIAL LATERAL MENISECTOMY, CHONDROPLASTY MEDIAL AND LATERAL;  Surgeon: Dereck Leep, MD;  Location: [facility] ORS;  Service: Orthopedics;  Laterality: Left;  . SHOULDER ARTHROSCOPY    . TONSILLECTOMY     Family History  Problem Relation Age of Onset  . Heart disease Father        myocardial infarction - died 96  . Diabetes Father   . Heart disease Mother        myocardial infarction  . Heart disease Brother        x2  . Diabetes Brother   . Diabetes Brother        x2  . Diabetes Sister   . Breast cancer Neg Hx   . Colon cancer Neg Hx    Social History   Social History  . Marital status: Married    Spouse name: N/A  . Number of children: 2  . Years of education: N/A    Social History Main Topics  . Smoking status: Never Smoker  . Smokeless tobacco: Never Used  . Alcohol use 0.0 oz/week     Comment: occasional  . Drug use: No  . Sexual activity: Not Currently   Other Topics Concern  . None   Social History Narrative  . None    Outpatient Encounter Prescriptions as of 03/13/2017  Medication Sig  . cholecalciferol (VITAMIN D) 1000 units tablet Take 1,000 Units by mouth daily.  Marland Kitchen SYNTHROID 88 MCG tablet TAKE ONE TABLET BY MOUTH EVERY DAY BEFORE BREAKFAST  . [DISCONTINUED] SYNTHROID 88 MCG tablet TAKE ONE TABLET BY MOUTH EVERY DAY BEFORE BREAKFAST   No facility-administered encounter medications on file as of 03/13/2017.     Review of Systems  Constitutional: Negative for appetite change and unexpected weight change.  HENT: Negative for congestion and sinus pressure.   Respiratory: Positive for cough. Negative for chest tightness and shortness of breath.   Cardiovascular: Negative for chest pain, palpitations and leg swelling.  Gastrointestinal: Negative for abdominal pain, diarrhea, nausea and vomiting.  Genitourinary: Negative for difficulty urinating  and dysuria.  Musculoskeletal: Negative for back pain and joint swelling.  Skin: Negative for color change and rash.  Neurological: Negative for dizziness, light-headedness and headaches.  Psychiatric/Behavioral: Negative for agitation and dysphoric mood.       Objective:    Physical Exam  Constitutional: She appears well-developed and well-nourished. No distress.  HENT:  Nose: Nose normal.  Mouth/Throat: Oropharynx is clear and moist.  Neck: Neck supple. No thyromegaly present.  Cardiovascular: Normal rate and regular rhythm.   Pulmonary/Chest: Breath sounds normal. No respiratory distress. She has no wheezes.  Abdominal: Soft. Bowel sounds are normal. There is no tenderness.  Musculoskeletal: She exhibits no edema or tenderness.  Lymphadenopathy:    She has no cervical adenopathy.    Skin: No rash noted. No erythema.  Psychiatric: She has a normal mood and affect. Her behavior is normal.    BP 126/74 (BP Location: Left Arm, Patient Position: Sitting, Cuff Size: Normal)   Pulse 95   Temp 98.6 F (37 C) (Oral)   Resp 12   Ht _0  (1.6 m)   Wt 135 lb (61.2 kg)   SpO2 95%   BMI 23.91 kg/m  Wt Readings from Last 3 Encounters:  03/13/17 135 lb (61.2 kg)  02/13/17 133 lb 9.6 oz (60.6 kg)  09/24/16 139 lb 12.8 oz (63.4 kg)     Lab Results  Component Value Date   WBC 5.8 09/09/2016   HGB 14.1 09/09/2016   HCT 42.1 09/09/2016   PLT 232.0 09/09/2016   GLUCOSE 125 (H) 03/09/2017   CHOL 247 (H) 03/09/2017   TRIG 111.0 03/09/2017   HDL 60.70 03/09/2017   LDLDIRECT 130.6 06/22/2013   LDLCALC 164 (H) 03/09/2017   ALT 12 03/09/2017   AST 14 03/09/2017   NA 140 03/09/2017   K 3.7 03/09/2017   CL 100 03/09/2017   CREATININE 0.75 03/09/2017   BUN 11 03/09/2017   CO2 32 03/09/2017   TSH 0.91 09/09/2016   HGBA1C 6.7 (H) 03/09/2017   MICROALBUR 5.1 (H) 03/08/2015    US Thyroid  Result Date: 11/12/2015 CLINICAL DATA:  Right-sided thyroid nodule. History of thyroid ablation many years ago. EXAM: THYROID ULTRASOUND TECHNIQUE: Ultrasound examination of the thyroid gland and adjacent soft tissues was performed. COMPARISON:  Thyroid ultrasound - 05/11/2015 FINDINGS: There is diffuse heterogeneity of the thyroid parenchymal echotexture. No new or enlarging thyroid nodules for Right thyroid lobe Measurements: Diminutive in size measuring 1.5 x 0.7 x 0.8 cm. Right, mid - 0.6 x 0.2 x 0.5 cm - hypoechoic, solid unchanged to decreased in size in interval, previously, 0.5 x 0.6 x 0.5 cm Left thyroid lobe Measurements: Diminutive in size measure 1.5 x 0.5 x 0.5 cm There is diffuse heterogeneity of the left lobe of the thyroid without discrete nodule or mass. Isthmus Thickness: Normal in size measures 0.2 cm in diameter. No discrete nodule or mass is identified within the thyroid  isthmus. Lymphadenopathy None visualized. IMPRESSION: 1. Similar findings of atrophic and heterogeneous appearing thyroid gland compatible with provided history of remote thyroid ablation. 2. Solitary punctate (approximately 0.6 cm) nodule within the right lobe of the thyroid is unchanged to slightly decreased in size since the 04/2015 examination. No new or enlarging thyroid nodules. Electronically Signed   By: Sandi Mariscal M.D.   On: 11/12/2015 14:08       Assessment & Plan:   Problem List Items Addressed This Visit    Cough    Persistent.  cxr as outlined.  She feels is more related to drainage.  nasacort nasal spray and robitussin as directed.  Is better.  Notify me if persistent.        Diabetes mellitus ([facility])    Low carb diet and exercises.  Follow met b and a1c.        Relevant Orders   Hemoglobin E5I   Basic metabolic panel   Hypercholesterolemia    Discussed recent lab results.  She declines cholesterol medication.  Low cholesterol diet and exercise.  Follow lipid panel.       Relevant Orders   Lipid panel   CBC with Differential/Platelet   Hepatic function panel   Hyperthyroidism    S/p ablation.  Maintained on synthroid.  Follow tsh.        Relevant Medications   SYNTHROID 88 MCG tablet   Murmur    Murmur is not new.  ECHO 2012 - mild AI and mild LVH.  EF>55%      Thyroid nodule    Was evaluated by Dr Eddie Dibbles.  Recommended no further evaluation since stable last check.        Relevant Medications   SYNTHROID 88 MCG tablet   Other Relevant Orders   TSH   Vitamin D deficiency    Taking vitamin D supplements.  Follow vitamin D level.            Einar Pheasant, MD

## 2017-03-13 NOTE — Patient Instructions (Signed)
nasacort nasal spray - 2 sprays each nostril one time per day.  Do this in the evening.   Robitussin - twice a day as needed.

## 2017-03-13 NOTE — Progress Notes (Signed)
Pre-visit discussion using our clinic review tool. No additional management support is needed unless otherwise documented below in the visit note.  

## 2017-03-15 ENCOUNTER — Encounter: Payer: Self-pay | Admitting: Internal Medicine

## 2017-03-15 NOTE — Assessment & Plan Note (Signed)
Was evaluated by Dr Eddie Dibbles.  Recommended no further evaluation since stable last check.

## 2017-03-15 NOTE — Assessment & Plan Note (Signed)
S/p ablation.  Maintained on synthroid.  Follow tsh.

## 2017-03-15 NOTE — Assessment & Plan Note (Signed)
Murmur is not new.  ECHO 2012 - mild AI and mild LVH.  EF>55%

## 2017-03-15 NOTE — Assessment & Plan Note (Signed)
Discussed recent lab results.  She declines cholesterol medication.  Low cholesterol diet and exercise.  Follow lipid panel.

## 2017-03-15 NOTE — Assessment & Plan Note (Signed)
Taking vitamin D supplements.  Follow vitamin D level.  

## 2017-03-15 NOTE — Assessment & Plan Note (Signed)
Low carb diet and exercises.  Follow met b and a1c.   

## 2017-03-15 NOTE — Assessment & Plan Note (Signed)
Persistent.  cxr as outlined.  She feels is more related to drainage.  nasacort nasal spray and robitussin as directed.  Is better.  Notify me if persistent.

## 2017-04-24 ENCOUNTER — Telehealth: Payer: Self-pay | Admitting: Internal Medicine

## 2017-04-24 DIAGNOSIS — R05 Cough: Secondary | ICD-10-CM | POA: Diagnosis not present

## 2017-04-24 NOTE — Telephone Encounter (Signed)
Given her symptoms with increased cough, ect, she needs to be evaluated to be able to determine treatment needed.  Recommend evaluation today.  If no acute spots here, to acute care.

## 2017-04-24 NOTE — Telephone Encounter (Signed)
Patient called complaining of cough and feeling bad. She says that she has been coughing non-stop and coughing up a little bit of yellow phlegm. Patient denies fever, chills, nausea, vomiting. She says that her husband has pneumonia. Symptoms have been going on about 2 days.

## 2017-04-24 NOTE — Telephone Encounter (Signed)
Pt aware and agreed to comply

## 2017-04-24 NOTE — Telephone Encounter (Signed)
Pt called and stated that she feels bad and coughing. Pt states that her spouse has pneumonia. Please advise, thank you!  Call pt @ [phone]

## 2017-05-27 ENCOUNTER — Encounter: Payer: Self-pay | Admitting: Internal Medicine

## 2017-05-27 ENCOUNTER — Ambulatory Visit (INDEPENDENT_AMBULATORY_CARE_PROVIDER_SITE_OTHER): Payer: Medicare Other | Admitting: Internal Medicine

## 2017-05-27 VITALS — BP 98/50 | HR 76 | Temp 98.4°F | Wt 132.0 lb

## 2017-05-27 DIAGNOSIS — K529 Noninfective gastroenteritis and colitis, unspecified: Secondary | ICD-10-CM | POA: Diagnosis not present

## 2017-05-27 MED ORDER — ONDANSETRON HCL 4 MG PO TABS: 4.0000 mg | ORAL_TABLET | Freq: Three times a day (TID) | ORAL | 0 refills | Status: DC | PRN

## 2017-05-27 NOTE — Progress Notes (Signed)
Subjective:    Patient ID: Shannon Mckinney, female    DOB: 01-25-1929, 83 y.o.   MRN: [id number]  HPI Here with husband due to vomting  2 nights --ate at [place] of wine Got home and noticed some stomach trouble Vomited about a half hour later---- three episodes at one time Stayed in bed through all of yesterday  No fever No diaphoresis Some abdominal pain then  Stayed in bed yesterday Only drank water--- no sig eating Still no appetite but able to get up today No further abdominal pain  Current Outpatient Prescriptions on File Prior to Visit  Medication Sig Dispense Refill  . cholecalciferol (VITAMIN D) 1000 units tablet Take 1,000 Units by mouth daily.    Marland Kitchen SYNTHROID 88 MCG tablet TAKE ONE TABLET BY MOUTH EVERY DAY BEFORE BREAKFAST 30 tablet 5   No current facility-administered medications on file prior to visit.     Allergies  Allergen Reactions  . Mysoline [Primidone] Other (See Comments)    "unknown"  . Penicillins Rash    Past Medical History:  Diagnosis Date  . Arthritis   . Benign head tremor   . Diabetes mellitus ([place])    diet controlled  . Fibrocystic breast disease   . Heart murmur   . History of abnormal Pap smear   . Hypercholesterolemia   . Hyperthyroidism    s/p ablation    Past Surgical History:  Procedure Laterality Date  . BREAST BIOPSY  70's   benign  . CARPAL TUNNEL RELEASE     right hand  . KNEE ARTHROSCOPY Left 09/12/2015   Procedure: ARTHROSCOPY LEFT KNEE, PARTIAL MEDIAL MENISECTOMY, PARTIAL LATERAL MENISECTOMY, CHONDROPLASTY MEDIAL AND LATERAL;  Surgeon: Dereck Leep, MD;  Location: [facility] ORS;  Service: Orthopedics;  Laterality: Left;  . SHOULDER ARTHROSCOPY    . TONSILLECTOMY      Family History  Problem Relation Age of Onset  . Heart disease Father        myocardial infarction - died 67  . Diabetes Father   . Heart disease Mother        myocardial infarction  . Heart disease Brother     x2  . Diabetes Brother   . Diabetes Brother        x2  . Diabetes Sister   . Breast cancer Neg Hx   . Colon cancer Neg Hx     Social History   Social History  . Marital status: Married    Spouse name: N/A  . Number of children: 2  . Years of education: N/A   Occupational History  . Not on file.   Social History Main Topics  . Smoking status: Never Smoker  . Smokeless tobacco: Never Used  . Alcohol use 0.0 oz/week     Comment: occasional  . Drug use: No  . Sexual activity: Not Currently   Other Topics Concern  . Not on file   Social History Narrative  . No narrative on file   Review of Systems No cough or SOB Head feels congested today No diarrhea  Bowels generally okay    Objective:   Physical Exam  Constitutional: No distress.  Looks washed out  HENT:  Mouth/Throat: Oropharynx is clear and moist. No oropharyngeal exudate.  Neck: No thyromegaly present.  Pulmonary/Chest: Effort normal and breath sounds normal. No respiratory distress. She has no wheezes. She has no rales.  Abdominal: Soft. Bowel sounds are normal. She exhibits no distension. There is no  tenderness. There is no rebound and no guarding.  Lymphadenopathy:    She has no cervical adenopathy.          Assessment & Plan:

## 2017-05-27 NOTE — Assessment & Plan Note (Signed)
Hard to tell if viral or food related Still washed out but improved Discussed slowly advancing diet Keep up with fluids Ondansetron for prn

## 2017-06-10 ENCOUNTER — Ambulatory Visit: Payer: BLUE CROSS/BLUE SHIELD

## 2017-06-11 ENCOUNTER — Ambulatory Visit (INDEPENDENT_AMBULATORY_CARE_PROVIDER_SITE_OTHER): Payer: Medicare Other

## 2017-06-11 VITALS — BP 120/70 | HR 69 | Temp 97.9°F | Resp 14 | Ht 63.0 in | Wt 133.8 lb

## 2017-06-11 DIAGNOSIS — Z1331 Encounter for screening for depression: Secondary | ICD-10-CM

## 2017-06-11 DIAGNOSIS — Z23 Encounter for immunization: Secondary | ICD-10-CM

## 2017-06-11 DIAGNOSIS — Z0000 Encounter for general adult medical examination without abnormal findings: Secondary | ICD-10-CM | POA: Diagnosis not present

## 2017-06-11 NOTE — Progress Notes (Signed)
Subjective:   Shannon Mckinney is a 83 y.o. female who presents for Medicare Annual (Subsequent) preventive examination.  Review of Systems:  No ROS.  Medicare Wellness Visit. Additional risk factors are reflected in the social history.  Cardiac Risk Factors include: advanced age (>73men, >33 women);diabetes mellitus     Objective:     Vitals: BP 120/70 (BP Location: Right Arm, Patient Position: Sitting, Cuff Size: Normal)   Pulse 69   Temp 97.9 F (36.6 C) (Oral)   Resp 14   Ht 5\' 3"  (1.6 m)   Wt 133 lb 12.8 oz (60.7 kg)   SpO2 95%   BMI 23.70 kg/m   Body mass index is 23.7 kg/m.   Tobacco Social History   Tobacco Use  Smoking Status Never Smoker  Smokeless Tobacco Never Used     Counseling given: Not Answered   Past Medical History:  Diagnosis Date  . Arthritis   . Benign head tremor   . Diabetes mellitus ([place])    diet controlled  . Fibrocystic breast disease   . Heart murmur   . History of abnormal Pap smear   . Hypercholesterolemia   . Hyperthyroidism    s/p ablation   Past Surgical History:  Procedure Laterality Date  . BREAST BIOPSY  70's   benign  . CARPAL TUNNEL RELEASE     right hand  . KNEE ARTHROSCOPY Left 09/12/2015   Procedure: ARTHROSCOPY LEFT KNEE, PARTIAL MEDIAL MENISECTOMY, PARTIAL LATERAL MENISECTOMY, CHONDROPLASTY MEDIAL AND LATERAL;  Surgeon: Dereck Leep, MD;  Location: [facility] OR[facility];  Service: Orthopedics;  Laterality: Left;  . SHOULDER ARTHROSCOPY    . TONSILLECTOMY     Family History  Problem Relation Age of Onset  . Heart disease Father        myocardial infarction - died 51  . Diabetes Father   . Heart disease Mother        myocardial infarction  . Heart disease Brother        x2  . Diabetes Brother   . Diabetes Brother        x2  . Diabetes Sister   . Breast cancer Neg Hx   . Colon cancer Neg Hx    Social History   Substance and Sexual Activity  Sexual Activity Not Currently    Outpatient Encounter Medications as  of 06/11/2017  Medication Sig  . cholecalciferol (VITAMIN D) 1000 units tablet Take 1,000 Units by mouth daily.  Marland Kitchen SYNTHROID 88 MCG tablet TAKE ONE TABLET BY MOUTH EVERY DAY BEFORE BREAKFAST  . [DISCONTINUED] ondansetron (ZOFRAN) 4 MG tablet Take 1 tablet (4 mg total) by mouth every 8 (eight) hours as needed for nausea or vomiting.   No facility-administered encounter medications on file as of 06/11/2017.     Activities of Daily Living In your present state of health, do you have any difficulty performing the following activities: 06/11/2017  Hearing? Y  Vision? N  Difficulty concentrating or making decisions? N  Walking or climbing stairs? N  Dressing or bathing? N  Doing errands, shopping? N  Preparing Food and eating ? N  Using the Toilet? N  In the past six months, have you accidently leaked urine? N  Do you have problems with loss of bowel control? N  Managing your Medications? N  Managing your Finances? N  Housekeeping or managing your Housekeeping? N  Some recent data might be hidden    Patient Care Team: Einar Pheasant, MD as PCP - General (  Internal Medicine)    Assessment:    This is a routine wellness examination for Shannon Mckinney. The goal of the wellness visit is to assist the patient how to close the gaps in care and create a preventative care plan for the patient.   The roster of all physicians providing medical care to patient is listed in the Snapshot section of the chart.  Taking calcium VIT D as appropriate/Osteoporosis risk reviewed.    Safety issues reviewed; Smoke and carbon monoxide detectors in the home. No firearms in the home.  Wears seatbelts when driving or riding with others. Patient does wear sunscreen or protective clothing when in direct sunlight. No violence in the home.  Depression- PHQ 2 &9 complete.  No signs/symptoms or verbal communication regarding little pleasure in doing things, feeling down, depressed or hopeless. No changes in sleeping,  energy, eating, concentrating.  No thoughts of self harm or harm towards others.  Time spent on this topic is 9 minutes.   Patient is alert, normal appearance, oriented to person/place/and time. Correctly identified the president of the [place], recall of 1/3 words, and performing simple calculations. Displays appropriate judgement and can read correct time from watch face.   No new identified risk were noted.  No failures at ADL's or IADL's.    BMI- discussed the importance of a healthy diet, water intake and the benefits of aerobic exercise. Educational material provided.   24 hour diet recall: Breakfast: egg with cheese omelette, grits Lunch: chicken salad, grapes Dinner: ham, green vegetable  Daily fluid intake: 0 cups of caffeine, 6 cups of water  Dental- every 3 months.  Eye- Visual acuity not assessed per patient preference since they have regular follow up with the ophthalmologist.   Sleep patterns- Sleeps 8 hours at night.  Wakes feeling rested.  Influenza vaccine administered L deltoid, tolerated well. Educational material provided.  TDAP vaccine deferred per patient preference.  Follow up with insurance.  Educational material provided.  Dexa Scan discussed.  Educational material provided.  Patient Concerns: None at this time. Follow up with PCP as needed.  Exercise Activities and Dietary recommendations Current Exercise Habits: The patient does not participate in regular exercise at present Fall Risk Fall Risk  06/11/2017 09/24/2016 06/06/2016 01/31/2014 12/20/2012  Falls in the past year? No No No Yes No  Number falls in past yr: - - - 1 -  Injury with Fall? - - - No -   Depression Screen PHQ 2/9 Scores 06/11/2017 09/24/2016 06/06/2016 01/31/2014  PHQ - 2 Score 0 0 0 0  PHQ- 9 Score 0 - - -     Cognitive Function MMSE - Mini Mental State Exam 06/11/2017 06/06/2016  Orientation to time 5 5  Orientation to Place 5 5  Registration 3 3  Attention/ Calculation 5 5    Recall 1 3  Language- name 2 objects 2 2  Language- repeat 1 1  Language- follow 3 step command 3 3  Language- read & follow direction 1 1  Write a sentence 1 1  Copy design 1 1  Total score 28 30     6CIT Screen 06/06/2016  What Year? 0 points  What month? 0 points  What time? 0 points  Count back from 20 0 points  Months in reverse 0 points  Repeat phrase 0 points  Total Score 0    Immunization History  Administered Date(s) Administered  . Influenza Split 04/27/2012, 05/16/2013, 05/11/2014  . Influenza,inj,Quad PF,6+ Mos 06/11/2017  . Influenza-Unspecified  04/24/2014, 06/04/2015, 06/24/2016  . Pneumococcal Conjugate-13 07/04/2013  . Pneumococcal Polysaccharide-23 09/12/2016   Screening Tests Health Maintenance  Topic Date Due  . TETANUS/TDAP  11/29/1949  . DEXA SCAN  11/30/1995  . URINE MICROALBUMIN  03/07/2016  . FOOT EXAM  06/26/2016  . INFLUENZA VACCINE  02/25/2017  . HEMOGLOBIN A1C  09/09/2017  . OPHTHALMOLOGY EXAM  01/01/2018  . PNA vac Low Risk Adult  Completed  . MAMMOGRAM  Discontinued      Plan:    End of life planning; Advance aging; Advanced directives discussed. Copy of current HCPOA/Living Will requested.    I have personally reviewed and noted the following in the patient's chart:   . Medical and social history . Use of alcohol, tobacco or illicit drugs  . Current medications and supplements . Functional ability and status . Nutritional status . Physical activity . Advanced directives . List of other physicians . Hospitalizations, surgeries, and ER visits in previous 12 months . Vitals . Screenings to include cognitive, depression, and falls . Referrals and appointments  In addition, I have reviewed and discussed with patient certain preventive protocols, quality metrics, and best practice recommendations. A written personalized care plan for preventive services as well as general preventive health recommendations were provided to  patient.     Varney Biles, LPN  24/23/5361   Reviewed above information.  Agree with assessment and plan.   Dr Nicki Reaper

## 2017-06-11 NOTE — Patient Instructions (Addendum)
  Shannon Mckinney , Thank you for taking time to come for your Medicare Wellness Visit. I appreciate your ongoing commitment to your health goals. Please review the following plan we discussed and let me know if I can assist you in the future.   Follow up with Dr. Nicki Reaper as needed.    Bring a copy of your [place] and/or Living Will to be scanned into chart.  Have a great day!  These are the goals we discussed:  Increase physical activity   This is a list of the screening recommended for you and due dates:  Health Maintenance  Topic Date Due  . Tetanus Vaccine  11/29/1949  . DEXA scan (bone density measurement)  11/30/1995  . Urine Protein Check  03/07/2016  . Complete foot exam   06/26/2016  . Flu Shot  02/25/2017  . Hemoglobin A1C  09/09/2017  . Eye exam for diabetics  01/01/2018  . Pneumonia vaccines  Completed  . Mammogram  Discontinued   Bone Densitometry Bone densitometry is an imaging test that uses a special X-ray to measure the amount of calcium and other minerals in your bones (bone density). This test is also known as a bone mineral density test or dual-energy X-ray absorptiometry (DXA). The test can measure bone density at your hip and your spine. It is similar to having a regular X-ray. You may have this test to:  Diagnose a condition that causes weak or thin bones (osteoporosis).  Predict your risk of a broken bone (fracture).  Determine how well osteoporosis treatment is working.  Tell a health care provider about:  Any allergies you have.  All medicines you are taking, including vitamins, herbs, eye drops, creams, and over-the-counter medicines.  Any problems you or family members have had with anesthetic medicines.  Any blood disorders you have.  Any surgeries you have had.  Any medical conditions you have.  Possibility of pregnancy.  Any other medical test you had within the previous 14 days that used contrast material. What are the  risks? Generally, this is a safe procedure. However, problems can occur and may include the following:  This test exposes you to a very small amount of radiation.  The risks of radiation exposure may be greater to unborn children.  What happens before the procedure?  Do not take any calcium supplements for 24 hours before having the test. You can otherwise eat and drink what you usually do.  Take off all metal jewelry, eyeglasses, dental appliances, and any other metal objects. What happens during the procedure?  You may lie on an exam table. There will be an X-ray generator below you and an imaging device above you.  Other devices, such as boxes or braces, may be used to position your body properly for the scan.  You will need to lie still while the machine slowly scans your body.  The images will show up on a computer monitor. What happens after the procedure? You may need more testing at a later time. This information is not intended to replace advice given to you by your health care provider. Make sure you discuss any questions you have with your health care provider. Document Released: 08/05/2004 Document Revised: 12/20/2015 Document Reviewed: 12/22/2013 Elsevier Interactive Patient Education  2018 [employer].

## 2017-06-24 ENCOUNTER — Other Ambulatory Visit (INDEPENDENT_AMBULATORY_CARE_PROVIDER_SITE_OTHER): Payer: Medicare Other

## 2017-06-24 DIAGNOSIS — E7800 Pure hypercholesterolemia, unspecified: Secondary | ICD-10-CM | POA: Diagnosis not present

## 2017-06-24 DIAGNOSIS — E119 Type 2 diabetes mellitus without complications: Secondary | ICD-10-CM

## 2017-06-24 DIAGNOSIS — E041 Nontoxic single thyroid nodule: Secondary | ICD-10-CM

## 2017-06-24 LAB — TSH: TSH: 0.64 u[IU]/mL (ref 0.35–4.50)

## 2017-06-24 LAB — LIPID PANEL
CHOL/HDL RATIO: 5
CHOLESTEROL: 265 mg/dL — AB (ref 0–200)
HDL: 57.8 mg/dL (ref >39.00)
LDL CALC: 173 mg/dL — AB (ref 0–99)
NonHDL: 207.19
Triglycerides: 173 mg/dL — ABNORMAL HIGH (ref 0.0–149.0)
VLDL: 34.6 mg/dL (ref 0.0–40.0)

## 2017-06-24 LAB — HEPATIC FUNCTION PANEL
ALK PHOS: 64 U/L (ref 39–117)
ALT: 15 U/L (ref 0–35)
AST: 16 U/L (ref 0–37)
Albumin: 4.1 g/dL (ref 3.5–5.2)
BILIRUBIN DIRECT: 0.1 mg/dL (ref 0.0–0.3)
BILIRUBIN TOTAL: 0.7 mg/dL (ref 0.2–1.2)
TOTAL PROTEIN: 7 g/dL (ref 6.0–8.3)

## 2017-06-24 LAB — BASIC METABOLIC PANEL
BUN: 14 mg/dL (ref 6–23)
CHLORIDE: 101 meq/L (ref 96–112)
CO2: 34 meq/L — AB (ref 19–32)
Calcium: 9.4 mg/dL (ref 8.4–10.5)
Creatinine, Ser: 0.77 mg/dL (ref 0.40–1.20)
GFR: 75.44 mL/min (ref >60.00)
Glucose, Bld: 131 mg/dL — ABNORMAL HIGH (ref 70–99)
POTASSIUM: 4 meq/L (ref 3.5–5.1)
SODIUM: 141 meq/L (ref 135–145)

## 2017-06-24 LAB — CBC WITH DIFFERENTIAL/PLATELET
BASOS PCT: 0.5 % (ref 0.0–3.0)
Basophils Absolute: 0 10*3/uL (ref 0.0–0.1)
EOS ABS: 0.2 10*3/uL (ref 0.0–0.7)
EOS PCT: 3.3 % (ref 0.0–5.0)
HEMATOCRIT: 40.3 % (ref 36.0–46.0)
HEMOGLOBIN: 13.4 g/dL (ref 12.0–15.0)
LYMPHS PCT: 40.2 % (ref 12.0–46.0)
Lymphs Abs: 2 10*3/uL (ref 0.7–4.0)
MCHC: 33.2 g/dL (ref 30.0–36.0)
MCV: 91.2 fl (ref 78.0–100.0)
MONOS PCT: 7.7 % (ref 3.0–12.0)
Monocytes Absolute: 0.4 10*3/uL (ref 0.1–1.0)
NEUTROS ABS: 2.4 10*3/uL (ref 1.4–7.7)
Neutrophils Relative %: 48.3 % (ref 43.0–77.0)
PLATELETS: 183 10*3/uL (ref 150.0–400.0)
RBC: 4.42 Mil/uL (ref 3.87–5.11)
RDW: 14.5 % (ref 11.5–15.5)
WBC: 5.1 10*3/uL (ref 4.0–10.5)

## 2017-06-24 LAB — HEMOGLOBIN A1C: Hgb A1c MFr Bld: 6.4 % (ref 4.6–6.5)

## 2017-06-25 ENCOUNTER — Encounter: Payer: Self-pay | Admitting: Internal Medicine

## 2017-06-25 ENCOUNTER — Ambulatory Visit (INDEPENDENT_AMBULATORY_CARE_PROVIDER_SITE_OTHER): Payer: Medicare Other | Admitting: Internal Medicine

## 2017-06-25 DIAGNOSIS — R011 Cardiac murmur, unspecified: Secondary | ICD-10-CM | POA: Diagnosis not present

## 2017-06-25 DIAGNOSIS — E041 Nontoxic single thyroid nodule: Secondary | ICD-10-CM

## 2017-06-25 DIAGNOSIS — E119 Type 2 diabetes mellitus without complications: Secondary | ICD-10-CM

## 2017-06-25 DIAGNOSIS — E559 Vitamin D deficiency, unspecified: Secondary | ICD-10-CM | POA: Diagnosis not present

## 2017-06-25 DIAGNOSIS — E7800 Pure hypercholesterolemia, unspecified: Secondary | ICD-10-CM | POA: Diagnosis not present

## 2017-06-25 NOTE — Progress Notes (Signed)
Patient ID: Shannon Mckinney, female   DOB: Mar 31, 1929, 83 y.o.   MRN: [id number]   Subjective:    Patient ID: Shannon Mckinney, female    DOB: 12-Sep-1928, 83 y.o.   MRN: [id number]  HPI  Patient here for a scheduled follow up.  She reports she is doing well.  Feels good.  Trying to stay active.  No chest pain.  No sob.  No acid reflux.  No abdominal pain.  Bowels moving.  No urine change.  Discussed labs.  Cholesterol elevated.  We have discussed starting cholesterol medication.  Discussed again today.  She declines.  Discussed diet and exercise.  Overall she feels she is doing well.     Past Medical History:  Diagnosis Date  . Arthritis   . Benign head tremor   . Diabetes mellitus ([place])    diet controlled  . Fibrocystic breast disease   . Heart murmur   . History of abnormal Pap smear   . Hypercholesterolemia   . Hyperthyroidism    s/p ablation   Past Surgical History:  Procedure Laterality Date  . BREAST BIOPSY  70's   benign  . CARPAL TUNNEL RELEASE     right hand  . KNEE ARTHROSCOPY Left 09/12/2015   Procedure: ARTHROSCOPY LEFT KNEE, PARTIAL MEDIAL MENISECTOMY, PARTIAL LATERAL MENISECTOMY, CHONDROPLASTY MEDIAL AND LATERAL;  Surgeon: Dereck Leep, MD;  Location: [facility] OR[facility];  Service: Orthopedics;  Laterality: Left;  . SHOULDER ARTHROSCOPY    . TONSILLECTOMY     Family History  Problem Relation Age of Onset  . Heart disease Father        myocardial infarction - died 67  . Diabetes Father   . Heart disease Mother        myocardial infarction  . Heart disease Brother        x2  . Diabetes Brother   . Diabetes Brother        x2  . Diabetes Sister   . Breast cancer Neg Hx   . Colon cancer Neg Hx    Social History   Socioeconomic History  . Marital status: Married    Spouse name: None  . Number of children: 2  . Years of education: None  . Highest education level: None  Social Needs  . Financial resource strain: None  . Food insecurity - worry: None  . Food insecurity -  inability: None  . Transportation needs - medical: None  . Transportation needs - non-medical: None  Occupational History  . None  Tobacco Use  . Smoking status: Never Smoker  . Smokeless tobacco: Never Used  Substance and Sexual Activity  . Alcohol use: Yes    Alcohol/week: 0.0 oz    Comment: occasional  . Drug use: No  . Sexual activity: Not Currently  Other Topics Concern  . None  Social History Narrative  . None    Outpatient Encounter Medications as of 06/25/2017  Medication Sig  . cholecalciferol (VITAMIN D) 1000 units tablet Take 1,000 Units by mouth daily.  Marland Kitchen SYNTHROID 88 MCG tablet TAKE ONE TABLET BY MOUTH EVERY DAY BEFORE BREAKFAST   No facility-administered encounter medications on file as of 06/25/2017.     Review of Systems  Constitutional: Negative for appetite change and unexpected weight change.  HENT: Negative for congestion and sinus pressure.   Respiratory: Negative for cough, chest tightness and shortness of breath.   Cardiovascular: Negative for chest pain, palpitations and leg swelling.  Gastrointestinal: Negative for abdominal  pain, diarrhea and nausea.  Genitourinary: Negative for difficulty urinating and dysuria.  Musculoskeletal: Negative for joint swelling and myalgias.  Skin: Negative for color change and rash.  Neurological: Negative for dizziness, light-headedness and headaches.  Psychiatric/Behavioral: Negative for agitation and dysphoric mood.       Objective:    Physical Exam  Constitutional: She appears well-developed and well-nourished. No distress.  HENT:  Nose: Nose normal.  Mouth/Throat: Oropharynx is clear and moist.  Neck: Neck supple. No thyromegaly present.  Cardiovascular: Normal rate and regular rhythm.  1/6 systolic murmur.    Pulmonary/Chest: Breath sounds normal. No respiratory distress. She has no wheezes.  Abdominal: Soft. Bowel sounds are normal. There is no tenderness.  Musculoskeletal: She exhibits no edema or  tenderness.  Lymphadenopathy:    She has no cervical adenopathy.  Skin: No rash noted. No erythema.  Psychiatric: She has a normal mood and affect. Her behavior is normal.    BP 122/70 (BP Location: Left Arm, Patient Position: Sitting, Cuff Size: Normal)   Pulse 74   Temp 97.9 F (36.6 C) (Oral)   Resp 17   Ht 5' 2.99" (1.6 m)   Wt 136 lb 3.2 oz (61.8 kg)   SpO2 95%   BMI 24.13 kg/m  Wt Readings from Last 3 Encounters:  06/25/17 136 lb 3.2 oz (61.8 kg)  06/11/17 133 lb 12.8 oz (60.7 kg)  05/27/17 132 lb (59.9 kg)     Lab Results  Component Value Date   WBC 5.1 06/24/2017   HGB 13.4 06/24/2017   HCT 40.3 06/24/2017   PLT 183.0 06/24/2017   GLUCOSE 131 (H) 06/24/2017   CHOL 265 (H) 06/24/2017   TRIG 173.0 (H) 06/24/2017   HDL 57.80 06/24/2017   LDLDIRECT 130.6 06/22/2013   LDLCALC 173 (H) 06/24/2017   ALT 15 06/24/2017   AST 16 06/24/2017   NA 141 06/24/2017   K 4.0 06/24/2017   CL 101 06/24/2017   CREATININE 0.77 06/24/2017   BUN 14 06/24/2017   CO2 34 (H) 06/24/2017   TSH 0.64 06/24/2017   HGBA1C 6.4 06/24/2017   MICROALBUR 5.1 (H) 03/08/2015    US Thyroid  Result Date: 11/12/2015 CLINICAL DATA:  Right-sided thyroid nodule. History of thyroid ablation many years ago. EXAM: THYROID ULTRASOUND TECHNIQUE: Ultrasound examination of the thyroid gland and adjacent soft tissues was performed. COMPARISON:  Thyroid ultrasound - 05/11/2015 FINDINGS: There is diffuse heterogeneity of the thyroid parenchymal echotexture. No new or enlarging thyroid nodules for Right thyroid lobe Measurements: Diminutive in size measuring 1.5 x 0.7 x 0.8 cm. Right, mid - 0.6 x 0.2 x 0.5 cm - hypoechoic, solid unchanged to decreased in size in interval, previously, 0.5 x 0.6 x 0.5 cm Left thyroid lobe Measurements: Diminutive in size measure 1.5 x 0.5 x 0.5 cm There is diffuse heterogeneity of the left lobe of the thyroid without discrete nodule or mass. Isthmus Thickness: Normal in size measures  0.2 cm in diameter. No discrete nodule or mass is identified within the thyroid isthmus. Lymphadenopathy None visualized. IMPRESSION: 1. Similar findings of atrophic and heterogeneous appearing thyroid gland compatible with provided history of remote thyroid ablation. 2. Solitary punctate (approximately 0.6 cm) nodule within the right lobe of the thyroid is unchanged to slightly decreased in size since the 04/2015 examination. No new or enlarging thyroid nodules. Electronically Signed   By: Sandi Mariscal M.D.   On: 11/12/2015 14:08       Assessment & Plan:   Problem List Items  Addressed This Visit    Diabetes mellitus ([place])    Low carb diet and exercise.  Follow met b and a1c.  a1c on recent check improved - 6.4.  Follow.       Hypercholesterolemia    Discussed recent cholesterol results.  Discussed medication.  She declines.  Low cholesterol diet and exercise.  Follow lipid panel.        Murmur    Murmur 1/6 on exam.  Not new.  Previous ECHO 2012 - mild AI and mild LVH.  EF >55%.        Thyroid nodule    Previously evaluated by Dr Eddie Dibbles.  Since stable, recommended no further evaluation.  Follow thyroid function.        Vitamin D deficiency    Follow vitamin D level.            Einar Pheasant, MD

## 2017-06-28 ENCOUNTER — Encounter: Payer: Self-pay | Admitting: Internal Medicine

## 2017-06-28 NOTE — Assessment & Plan Note (Signed)
Follow vitamin D level.  

## 2017-06-28 NOTE — Assessment & Plan Note (Signed)
Discussed recent cholesterol results.  Discussed medication.  She declines.  Low cholesterol diet and exercise.  Follow lipid panel.

## 2017-06-28 NOTE — Assessment & Plan Note (Signed)
Low carb diet and exercise.  Follow met b and a1c.  a1c on recent check improved - 6.4.  Follow.

## 2017-06-28 NOTE — Assessment & Plan Note (Signed)
Previously evaluated by Dr Eddie Dibbles.  Since stable, recommended no further evaluation.  Follow thyroid function.

## 2017-06-28 NOTE — Assessment & Plan Note (Signed)
Murmur 1/6 on exam.  Not new.  Previous ECHO 2012 - mild AI and mild LVH.  EF >55%.

## 2017-08-04 DIAGNOSIS — L905 Scar conditions and fibrosis of skin: Secondary | ICD-10-CM | POA: Diagnosis not present

## 2017-08-04 DIAGNOSIS — D229 Melanocytic nevi, unspecified: Secondary | ICD-10-CM | POA: Diagnosis not present

## 2017-08-04 DIAGNOSIS — Z85828 Personal history of other malignant neoplasm of skin: Secondary | ICD-10-CM | POA: Diagnosis not present

## 2017-08-04 DIAGNOSIS — Z1283 Encounter for screening for malignant neoplasm of skin: Secondary | ICD-10-CM | POA: Diagnosis not present

## 2017-08-04 DIAGNOSIS — I8393 Asymptomatic varicose veins of bilateral lower extremities: Secondary | ICD-10-CM | POA: Diagnosis not present

## 2017-08-04 DIAGNOSIS — L821 Other seborrheic keratosis: Secondary | ICD-10-CM | POA: Diagnosis not present

## 2017-09-24 ENCOUNTER — Telehealth: Payer: Self-pay | Admitting: Internal Medicine

## 2017-09-24 NOTE — Telephone Encounter (Signed)
Copied from [place]. Topic: General - Other >> Sep 24, 2017  1:23 PM Darl Householder, RMA wrote: Reason for CRM: Medication refill request for  SYNTHROID 88 MCG tablet to be sent to [facility]

## 2017-09-25 MED ORDER — SYNTHROID 88 MCG PO TABS: ORAL_TABLET | ORAL | 5 refills | Status: DC

## 2017-09-25 NOTE — Telephone Encounter (Signed)
Synthroid refill Last OV: 06/25/17 Last Refill:03/13/17 Pharmacy:[facility]

## 2017-10-12 ENCOUNTER — Encounter: Payer: Self-pay | Admitting: Internal Medicine

## 2017-10-22 ENCOUNTER — Encounter: Payer: Self-pay | Admitting: Internal Medicine

## 2017-10-22 ENCOUNTER — Ambulatory Visit (INDEPENDENT_AMBULATORY_CARE_PROVIDER_SITE_OTHER): Payer: Medicare Other | Admitting: Internal Medicine

## 2017-10-22 DIAGNOSIS — M2392 Unspecified internal derangement of left knee: Secondary | ICD-10-CM

## 2017-10-22 DIAGNOSIS — Z0000 Encounter for general adult medical examination without abnormal findings: Secondary | ICD-10-CM | POA: Diagnosis not present

## 2017-10-22 DIAGNOSIS — E559 Vitamin D deficiency, unspecified: Secondary | ICD-10-CM | POA: Diagnosis not present

## 2017-10-22 DIAGNOSIS — E7800 Pure hypercholesterolemia, unspecified: Secondary | ICD-10-CM | POA: Diagnosis not present

## 2017-10-22 DIAGNOSIS — E0590 Thyrotoxicosis, unspecified without thyrotoxic crisis or storm: Secondary | ICD-10-CM | POA: Diagnosis not present

## 2017-10-22 DIAGNOSIS — E119 Type 2 diabetes mellitus without complications: Secondary | ICD-10-CM

## 2017-10-22 NOTE — Assessment & Plan Note (Addendum)
Physical today 10/22/17.  Declines mammogram and declines GI evaluation.

## 2017-10-22 NOTE — Progress Notes (Signed)
Patient ID: Shannon Mckinney, female   DOB: Jan 27, 1929, 84 y.o.   MRN: [id number]   Subjective:    Patient ID: Shannon Mckinney, female    DOB: [birth year], 84 y.o.   MRN: [id number]  HPI  Patient with past history of diabetes and hypercholesterolemia. She comes in today to follow up on these issues as well as for a complete physical exam.  She reports she is doing well.  Feels good.  Tries to stay active.  No chest pain.  No sob.  No acid reflux.  No abdominal pain.  Bowels moving.  No urine change.  Does report some left knee pain at times.  Notices with weight bearing or walking.  No significant pain.  No increased swelling or erythema.  Discussed vaccines.  Discussed shingrix.     Past Medical History:  Diagnosis Date  . Arthritis   . Benign head tremor   . Diabetes mellitus ([place])    diet controlled  . Fibrocystic breast disease   . Heart murmur   . History of abnormal Pap smear   . Hypercholesterolemia   . Hyperthyroidism    s/p ablation   Past Surgical History:  Procedure Laterality Date  . BREAST BIOPSY  70's   benign  . CARPAL TUNNEL RELEASE     right hand  . KNEE ARTHROSCOPY Left 09/12/2015   Procedure: ARTHROSCOPY LEFT KNEE, PARTIAL MEDIAL MENISECTOMY, PARTIAL LATERAL MENISECTOMY, CHONDROPLASTY MEDIAL AND LATERAL;  Surgeon: Dereck Leep, MD;  Location: [facility] ORS;  Service: Orthopedics;  Laterality: Left;  . SHOULDER ARTHROSCOPY    . TONSILLECTOMY     Family History  Problem Relation Age of Onset  . Heart disease Father        myocardial infarction - died 49  . Diabetes Father   . Heart disease Mother        myocardial infarction  . Heart disease Brother        x2  . Diabetes Brother   . Diabetes Brother        x2  . Diabetes Sister   . Breast cancer Neg Hx   . Colon cancer Neg Hx    Social History   Socioeconomic History  . Marital status: Married    Spouse name: Not on file  . Number of children: 2  . Years of education: Not on file  . Highest education level:  Not on file  Occupational History  . Not on file  Social Needs  . Financial resource strain: Not on file  . Food insecurity:    Worry: Not on file    Inability: Not on file  . Transportation needs:    Medical: Not on file    Non-medical: Not on file  Tobacco Use  . Smoking status: Never Smoker  . Smokeless tobacco: Never Used  Substance and Sexual Activity  . Alcohol use: Yes    Alcohol/week: 0.0 oz    Comment: occasional  . Drug use: No  . Sexual activity: Not Currently  Lifestyle  . Physical activity:    Days per week: Not on file    Minutes per session: Not on file  . Stress: Not on file  Relationships  . Social connections:    Talks on phone: Not on file    Gets together: Not on file    Attends religious service: Not on file    Active member of club or organization: Not on file    Attends meetings of clubs or organizations:  Not on file    Relationship status: Not on file  Other Topics Concern  . Not on file  Social History Narrative  . Not on file    Outpatient Encounter Medications as of 10/22/2017  Medication Sig  . cholecalciferol (VITAMIN D) 1000 units tablet Take 1,000 Units by mouth daily.  Marland Kitchen SYNTHROID 88 MCG tablet TAKE ONE TABLET BY MOUTH EVERY DAY BEFORE BREAKFAST   No facility-administered encounter medications on file as of 10/22/2017.     Review of Systems  Constitutional: Negative for appetite change and unexpected weight change.  HENT: Negative for congestion and sinus pressure.   Eyes: Negative for pain and visual disturbance.  Respiratory: Negative for cough, chest tightness and shortness of breath.   Cardiovascular: Negative for chest pain, palpitations and leg swelling.  Gastrointestinal: Negative for abdominal pain, diarrhea and nausea.  Genitourinary: Negative for difficulty urinating and dysuria.  Musculoskeletal: Negative for joint swelling and myalgias.  Skin: Negative for color change and rash.  Neurological: Negative for dizziness,  light-headedness and headaches.  Hematological: Negative for adenopathy. Does not bruise/bleed easily.  Psychiatric/Behavioral: Negative for agitation and dysphoric mood.       Objective:    Physical Exam  Constitutional: She is oriented to person, place, and time. She appears well-developed and well-nourished. No distress.  HENT:  Nose: Nose normal.  Mouth/Throat: Oropharynx is clear and moist.  Eyes: Right eye exhibits no discharge. Left eye exhibits no discharge. No scleral icterus.  Neck: Neck supple. No thyromegaly present.  Cardiovascular: Normal rate and regular rhythm.  Pulmonary/Chest: Breath sounds normal. No accessory muscle usage. No tachypnea. No respiratory distress. She has no decreased breath sounds. She has no wheezes. She has no rhonchi. Right breast exhibits no inverted nipple, no mass, no nipple discharge and no tenderness (no axillary adenopathy). Left breast exhibits no inverted nipple, no mass, no nipple discharge and no tenderness (no axilarry adenopathy).  Abdominal: Soft. Bowel sounds are normal. There is no tenderness.  Musculoskeletal: She exhibits no edema or tenderness.  Feet:  No lesions.  DP pulses palpable and equal bilaterally.  Intact to light touch and pin prick.    Lymphadenopathy:    She has no cervical adenopathy.  Neurological: She is alert and oriented to person, place, and time.  Skin: Skin is warm. No rash noted. No erythema.  Psychiatric: She has a normal mood and affect. Her behavior is normal.    BP 120/66 (BP Location: Left Arm, Patient Position: Sitting, Cuff Size: Normal)   Pulse 70   Temp 98.1 F (36.7 C) (Oral)   Resp 18   Ht '5\' 3"'$  (1.6 m)   Wt 136 lb (61.7 kg)   SpO2 94%   BMI 24.09 kg/m  Wt Readings from Last 3 Encounters:  10/22/17 136 lb (61.7 kg)  06/25/17 136 lb 3.2 oz (61.8 kg)  06/11/17 133 lb 12.8 oz (60.7 kg)     Lab Results  Component Value Date   WBC 5.1 06/24/2017   HGB 13.4 06/24/2017   HCT 40.3  06/24/2017   PLT 183.0 06/24/2017   GLUCOSE 131 (H) 06/24/2017   CHOL 265 (H) 06/24/2017   TRIG 173.0 (H) 06/24/2017   HDL 57.80 06/24/2017   LDLDIRECT 130.6 06/22/2013   LDLCALC 173 (H) 06/24/2017   ALT 15 06/24/2017   AST 16 06/24/2017   NA 141 06/24/2017   K 4.0 06/24/2017   CL 101 06/24/2017   CREATININE 0.77 06/24/2017   BUN 14 06/24/2017  CO2 34 (H) 06/24/2017   TSH 0.64 06/24/2017   HGBA1C 6.4 06/24/2017   MICROALBUR 5.1 (H) 03/08/2015    US Thyroid  Result Date: 11/12/2015 CLINICAL DATA:  Right-sided thyroid nodule. History of thyroid ablation many years ago. EXAM: THYROID ULTRASOUND TECHNIQUE: Ultrasound examination of the thyroid gland and adjacent soft tissues was performed. COMPARISON:  Thyroid ultrasound - 05/11/2015 FINDINGS: There is diffuse heterogeneity of the thyroid parenchymal echotexture. No new or enlarging thyroid nodules for Right thyroid lobe Measurements: Diminutive in size measuring 1.5 x 0.7 x 0.8 cm. Right, mid - 0.6 x 0.2 x 0.5 cm - hypoechoic, solid unchanged to decreased in size in interval, previously, 0.5 x 0.6 x 0.5 cm Left thyroid lobe Measurements: Diminutive in size measure 1.5 x 0.5 x 0.5 cm There is diffuse heterogeneity of the left lobe of the thyroid without discrete nodule or mass. Isthmus Thickness: Normal in size measures 0.2 cm in diameter. No discrete nodule or mass is identified within the thyroid isthmus. Lymphadenopathy None visualized. IMPRESSION: 1. Similar findings of atrophic and heterogeneous appearing thyroid gland compatible with provided history of remote thyroid ablation. 2. Solitary punctate (approximately 0.6 cm) nodule within the right lobe of the thyroid is unchanged to slightly decreased in size since the 04/2015 examination. No new or enlarging thyroid nodules. Electronically Signed   By: Sandi Mariscal M.D.   On: 11/12/2015 14:08       Assessment & Plan:   Problem List Items Addressed This Visit    Diabetes mellitus ([place])      Low carb diet and exercise.  Follow met b and a1c.        Relevant Orders   Hemoglobin S2G   Basic metabolic panel   Health care maintenance    Physical today 10/22/17.  Declines mammogram and declines GI evaluation.       Hypercholesterolemia    Have discussed medication.  She declines.  Low cholesterol diet and exercise.  Follow lipid panel and liver function tests.        Relevant Orders   Lipid panel   Hepatic function panel   Hyperthyroidism    S/p ablation.  Maintained on synthroid.  Follow tsh.        Internal derangement of left knee    Some left knee pain as outlined.  No significant pain.  Desires no further intervention.  Follow.        Vitamin D deficiency    Follow vitamin D level.        Relevant Orders   VITAMIN D 25 Hydroxy (Vit-D Deficiency, Fractures)       Einar Pheasant, MD

## 2017-10-23 ENCOUNTER — Encounter: Payer: Medicare Other | Admitting: Internal Medicine

## 2017-10-25 ENCOUNTER — Encounter: Payer: Self-pay | Admitting: Internal Medicine

## 2017-10-25 NOTE — Assessment & Plan Note (Signed)
Low carb diet and exercise.  Follow met b and a1c.   

## 2017-10-25 NOTE — Assessment & Plan Note (Signed)
Follow vitamin D level.  

## 2017-10-25 NOTE — Assessment & Plan Note (Signed)
Have discussed medication.  She declines.  Low cholesterol diet and exercise.  Follow lipid panel and liver function tests.

## 2017-10-25 NOTE — Assessment & Plan Note (Signed)
Some left knee pain as outlined.  No significant pain.  Desires no further intervention.  Follow.

## 2017-10-25 NOTE — Assessment & Plan Note (Signed)
S/p ablation.  Maintained on synthroid.  Follow tsh.

## 2017-11-21 IMAGING — MR MR KNEE*L* W/O CM
7 series · 38 of 40 positions shown · non-contrast
Comparison: None.

CLINICAL DATA: Diffuse left knee pain for 1 year with limited range
of motion. No known injury. Subsequent encounter.

EXAM:
MRI OF THE LEFT KNEE WITHOUT CONTRAST
TECHNIQUE: Multiplanar, multisequence MR imaging of the knee was performed. No
intravenous contrast was administered.

[Series 5: PD fat-sat · axial · 3.0mm · 0.29mm/px · z∈[-77,+28]mm · 5 of 33 slices shown (1 of 4)]
[im 1/33]
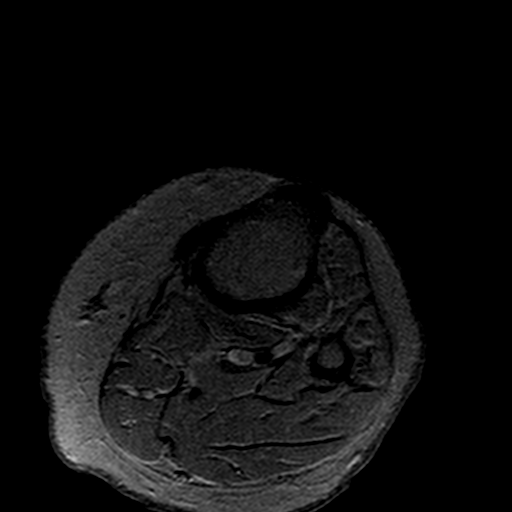
[im 9/33]
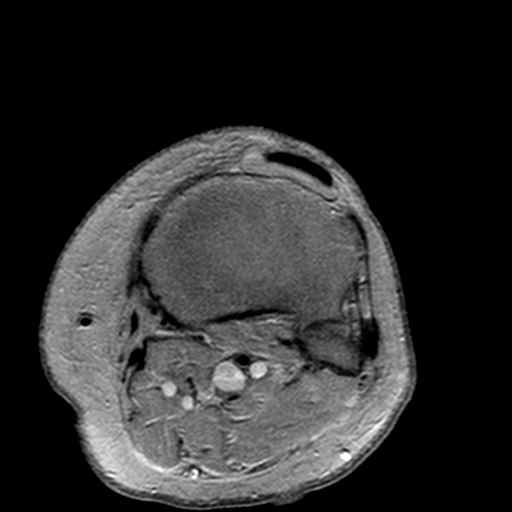
[im 17/33]
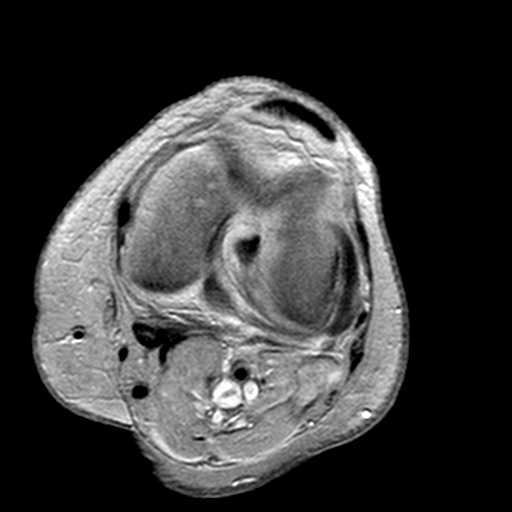
[im 25/33]
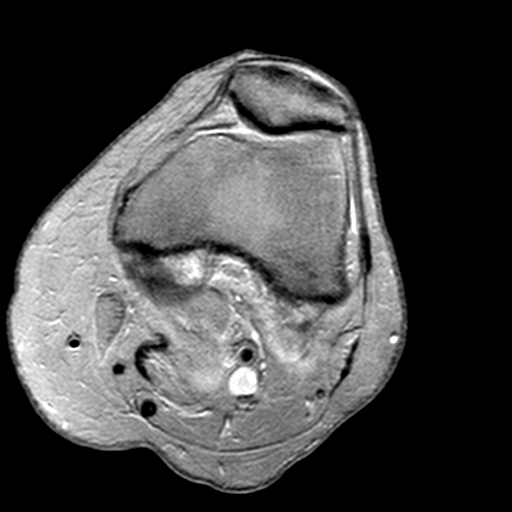
[im 33/33]
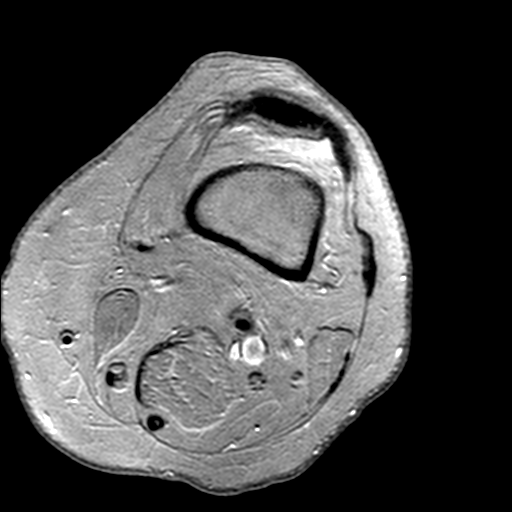

[Series 6: T2 fat-sat · coronal · 3.0mm · 0.62mm/px · 5 of 30 slices shown]
[im 1/30]
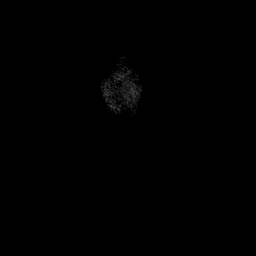
[im 8/30]
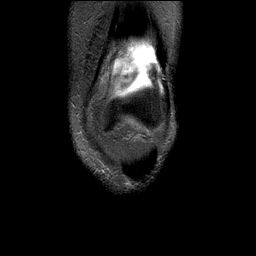
[im 15/30]
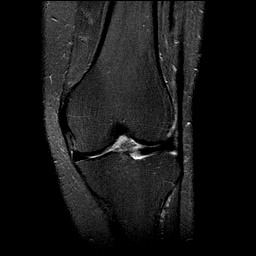
[im 22/30]
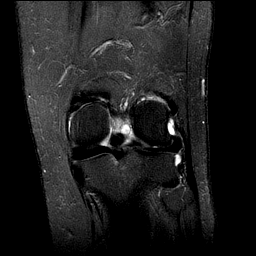
[im 30/30]
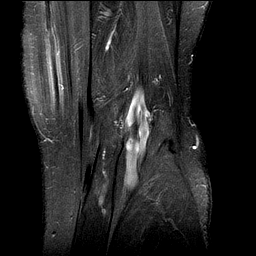

[Series 7: PD fat-sat · axial · 3.0mm · 0.29mm/px · z∈[-77,+28]mm · 6 of 33 slices shown (2 of 4)]
[im 1/33]
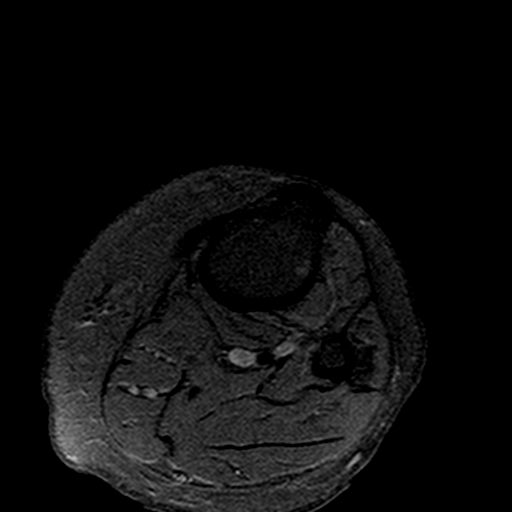
[im 7/33]
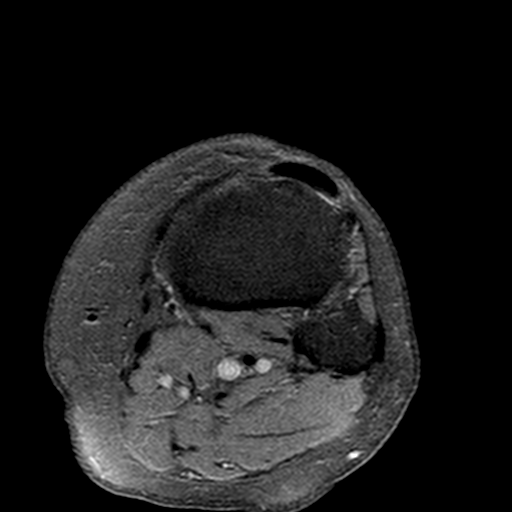
[im 13/33]
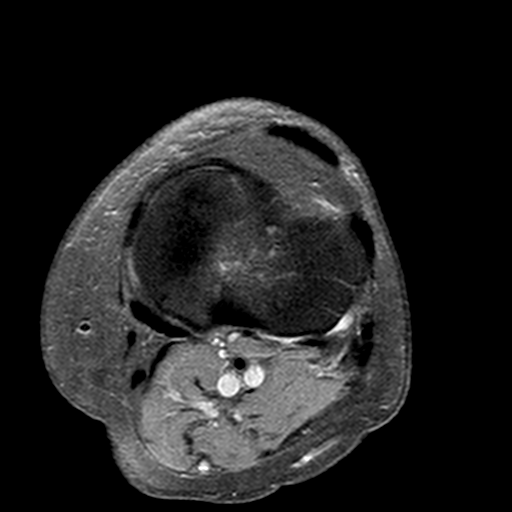
[im 20/33]
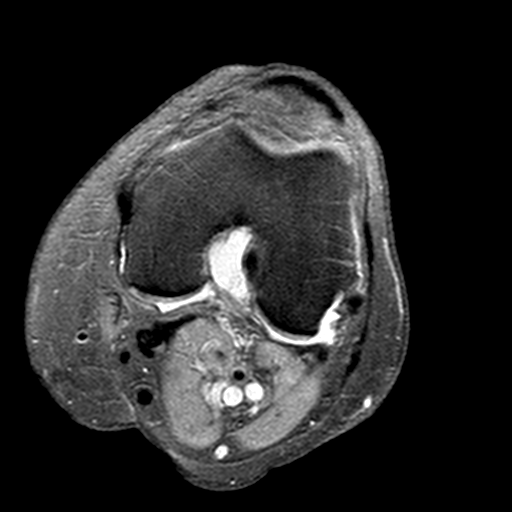
[im 26/33]
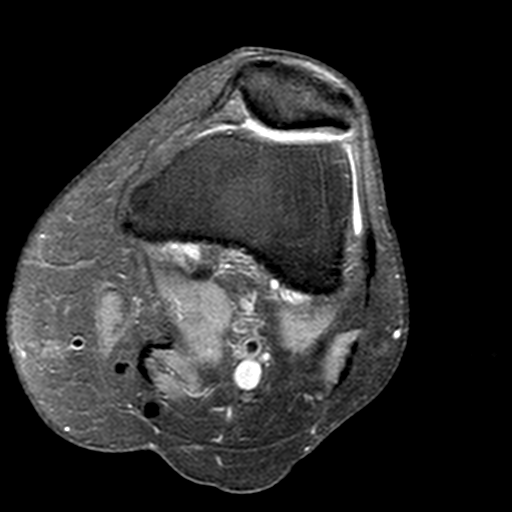
[im 33/33]
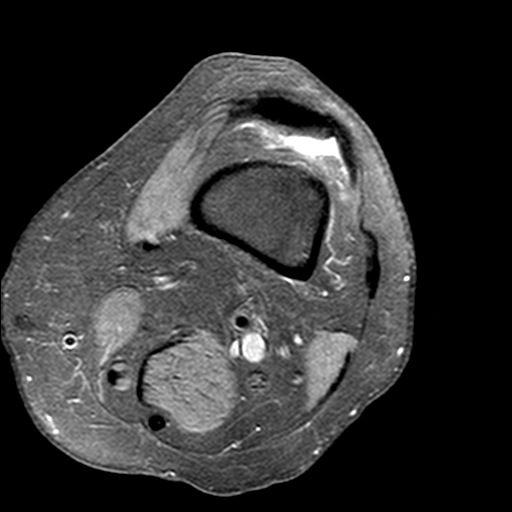

[Series 8: PD fat-sat · sagittal · 3.0mm · 0.62mm/px · 6 of 30 slices shown (3 of 4)]
[im 1/30]
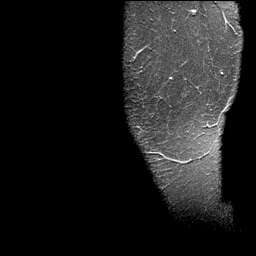
[im 6/30]
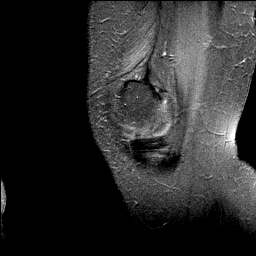
[im 12/30]
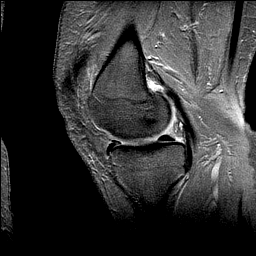
[im 18/30]
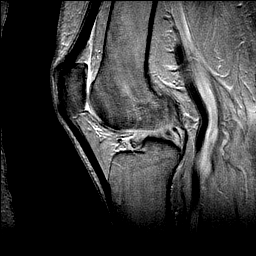
[im 24/30]
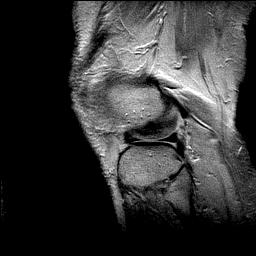
[im 30/30]
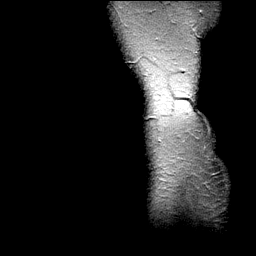

[Series 9: PD fat-sat · coronal · 3.0mm · 0.62mm/px · 6 of 30 slices shown (4 of 4)]
[im 1/30]
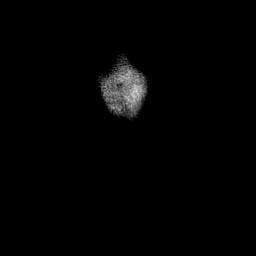
[im 6/30]
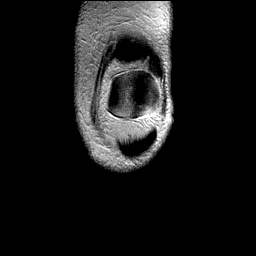
[im 12/30]
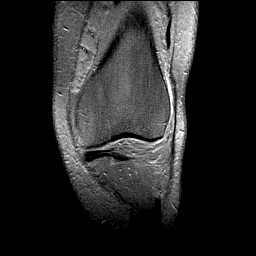
[im 18/30]
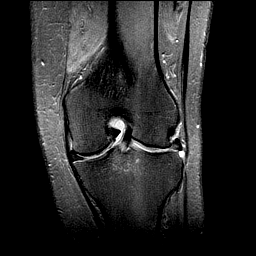
[im 24/30]
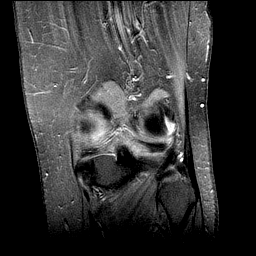
[im 30/30]
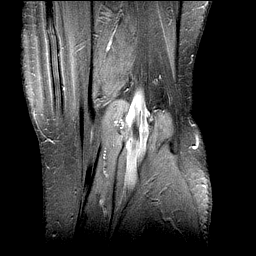

[Series 10: T1 · coronal · 3.0mm · 0.62mm/px · 6 of 30 slices shown]
[im 1/30]
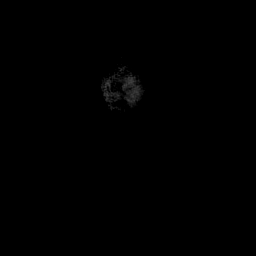
[im 6/30]
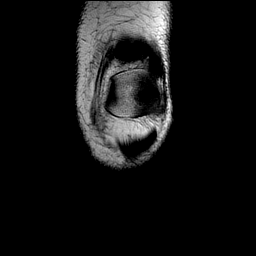
[im 12/30]
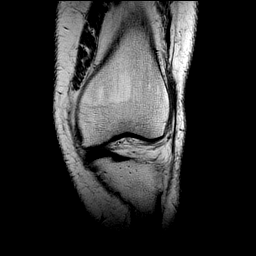
[im 18/30]
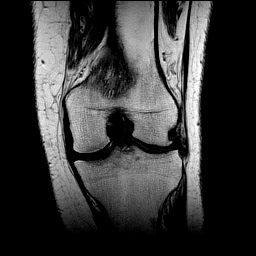
[im 24/30]
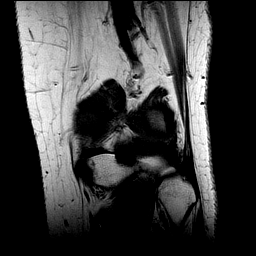
[im 30/30]
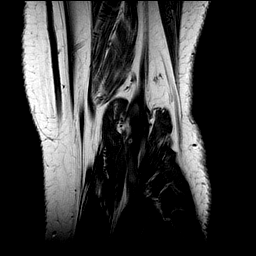

[Series 11: STIR · sagittal · 3.0mm · 0.62mm/px · 4 of 30 slices shown]
[im 1/30]
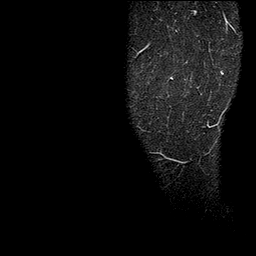
[im 6/30]
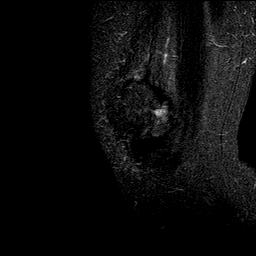
[im 12/30]
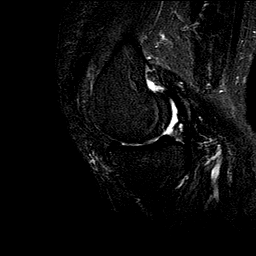
[im 18/30]
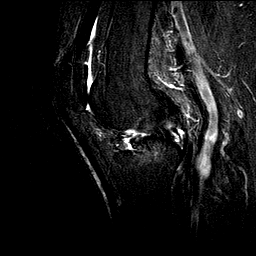

[38 of 40 positions shown; findings below may reference images not displayed]

FINDINGS: MENISCI

Medial meniscus: Degenerative signal is seen in the posterior horn
but no tear is identified.

Lateral meniscus: Fraying along the free edge of the body is
identified. A horizontal tear at the junction of the anterior horn
and body reaches the femoral articular surface.

LIGAMENTS

Cruciates:  Intact.

Collaterals:  Intact.

CARTILAGE

Patellofemoral:  Mildly degenerated.

Medial:  Mildly degenerated.

Lateral:  Mildly degenerated.

Joint:  Very small joint effusion.

Popliteal Fossa:  No Baker's cyst.

Extensor Mechanism:  Intact.

Bones: Small osteophytes are present about the lateral compartment.
No fracture, stress change or worrisome marrow lesion.
IMPRESSION: Fraying along the free edge of the body of the lateral meniscus with
a horizontal tear at the junction of the anterior horn and body.

Degenerative signal posterior horn medial meniscus without tear
identified.

Very mild osteoarthritis.

## 2017-11-30 DIAGNOSIS — L239 Allergic contact dermatitis, unspecified cause: Secondary | ICD-10-CM | POA: Diagnosis not present

## 2018-02-02 DIAGNOSIS — D1801 Hemangioma of skin and subcutaneous tissue: Secondary | ICD-10-CM | POA: Diagnosis not present

## 2018-02-02 DIAGNOSIS — D225 Melanocytic nevi of trunk: Secondary | ICD-10-CM | POA: Diagnosis not present

## 2018-02-02 DIAGNOSIS — Z1283 Encounter for screening for malignant neoplasm of skin: Secondary | ICD-10-CM | POA: Diagnosis not present

## 2018-02-02 DIAGNOSIS — Z85828 Personal history of other malignant neoplasm of skin: Secondary | ICD-10-CM | POA: Diagnosis not present

## 2018-02-02 DIAGNOSIS — L821 Other seborrheic keratosis: Secondary | ICD-10-CM | POA: Diagnosis not present

## 2018-02-02 DIAGNOSIS — D485 Neoplasm of uncertain behavior of skin: Secondary | ICD-10-CM | POA: Diagnosis not present

## 2018-02-02 DIAGNOSIS — L812 Freckles: Secondary | ICD-10-CM | POA: Diagnosis not present

## 2018-02-02 DIAGNOSIS — D2262 Melanocytic nevi of left upper limb, including shoulder: Secondary | ICD-10-CM | POA: Diagnosis not present

## 2018-02-02 DIAGNOSIS — D2339 Other benign neoplasm of skin of other parts of face: Secondary | ICD-10-CM | POA: Diagnosis not present

## 2018-03-06 ENCOUNTER — Other Ambulatory Visit: Payer: Self-pay | Admitting: Internal Medicine

## 2018-03-30 ENCOUNTER — Ambulatory Visit (INDEPENDENT_AMBULATORY_CARE_PROVIDER_SITE_OTHER): Payer: Medicare Other | Admitting: Family Medicine

## 2018-03-30 ENCOUNTER — Encounter: Payer: Self-pay | Admitting: Family Medicine

## 2018-03-30 VITALS — BP 110/58 | HR 61 | Temp 98.1°F | Ht 63.0 in | Wt 135.6 lb

## 2018-03-30 DIAGNOSIS — M8580 Other specified disorders of bone density and structure, unspecified site: Secondary | ICD-10-CM | POA: Diagnosis not present

## 2018-03-30 DIAGNOSIS — E890 Postprocedural hypothyroidism: Secondary | ICD-10-CM | POA: Diagnosis not present

## 2018-03-30 DIAGNOSIS — Z23 Encounter for immunization: Secondary | ICD-10-CM

## 2018-03-30 DIAGNOSIS — E559 Vitamin D deficiency, unspecified: Secondary | ICD-10-CM

## 2018-03-30 DIAGNOSIS — E119 Type 2 diabetes mellitus without complications: Secondary | ICD-10-CM

## 2018-03-30 DIAGNOSIS — E7800 Pure hypercholesterolemia, unspecified: Secondary | ICD-10-CM

## 2018-03-30 LAB — POCT UA - MICROALBUMIN: Microalbumin Ur, POC: 20 mg/L

## 2018-03-30 NOTE — Assessment & Plan Note (Signed)
Continue vitamin D supplement and weightbearing exercise

## 2018-03-30 NOTE — Assessment & Plan Note (Addendum)
Reviewed most recent A1c Patient to send report from upcoming eye exam next time foot exam completed today Recheck A1c Check urine microalbumin Continue low-carb diet and exercise

## 2018-03-30 NOTE — Patient Instructions (Signed)

## 2018-03-30 NOTE — Assessment & Plan Note (Signed)
Continue daily vitamin D supplement

## 2018-03-30 NOTE — Assessment & Plan Note (Signed)
Reviewed patient's most recent lipid panel and CMP She is following a low-cholesterol diet and exercising regularly It appears she has discussed statins with her previous PCP multiple times and she has declined them

## 2018-03-30 NOTE — Assessment & Plan Note (Signed)
Well-controlled and asymptomatic Last TSH reviewed Continue Synthroid 88 mcg daily Recheck TSH

## 2018-03-30 NOTE — Progress Notes (Signed)
Patient: Shannon Mckinney, Female    DOB: 04/19/1929, 84 y.o.   MRN: [id number] Visit Date: 03/30/2018  Today's Provider: Lavon Paganini, MD   Chief Complaint  Patient presents with  . Establish Care   Subjective:  I, Shannon Mckinney, CMA, am acting as a scribe for Lavon Paganini, MD.   New Patient: Shannon Mckinney is a 84 year old female who presents today to [facility] as a new patient. Patient was preiously seen at [facility]. Dr. Nicki Reaper. Patient denies any concerns today. She states she is doing well with current medication regimen.   H/o hyperthyroidism s/p ablation.  Now taking Synthroid 88 mcg daily with good compliance.  Denies any hair/skin changes, constipation, diarrhea, heat/cold intolerance, palpitations.  HLD, T2DM: Both diet controlled.  She is never had any cardiovascular complications.  She is never taken medications for this.  She has an upcoming eye exam next week.  Osteopenia: Mentioned in the patient's chart, but she has no history of fracture and there is no DEXA scan available for review.  She is not on any treatment for this.  She is taking vitamin D daily as she has history of vitamin D deficiency. -----------------------------------------------------------------   Review of Systems  Constitutional: Negative.   HENT: Positive for hearing loss.   Eyes: Negative.   Respiratory: Negative.   Cardiovascular: Negative.   Gastrointestinal: Negative.   Endocrine: Positive for polyuria.  Genitourinary: Negative.   Musculoskeletal: Negative.   Skin: Negative.   Allergic/Immunologic: Negative.   Neurological: Positive for tremors.  Hematological: Negative.   Psychiatric/Behavioral: Positive for confusion.    Social History      She  reports that she has never smoked. She has never used smokeless tobacco. She reports that she drinks alcohol. She reports that she does not use drugs.       Social History   Socioeconomic History  . Marital  status: Married    Spouse name: Not on file  . Number of children: 2  . Years of education: Not on file  . Highest education level: Not on file  Occupational History  . Not on file  Social Needs  . Financial resource strain: Not on file  . Food insecurity:    Worry: Not on file    Inability: Not on file  . Transportation needs:    Medical: Not on file    Non-medical: Not on file  Tobacco Use  . Smoking status: Never Smoker  . Smokeless tobacco: Never Used  Substance and Sexual Activity  . Alcohol use: Yes    Alcohol/week: 0.0 standard drinks    Comment: occasional  . Drug use: No  . Sexual activity: Not Currently  Lifestyle  . Physical activity:    Days per week: Not on file    Minutes per session: Not on file  . Stress: Not on file  Relationships  . Social connections:    Talks on phone: Not on file    Gets together: Not on file    Attends religious service: Not on file    Active member of club or organization: Not on file    Attends meetings of clubs or organizations: Not on file    Relationship status: Not on file  Other Topics Concern  . Not on file  Social History Narrative  . Not on file    Past Medical History:  Diagnosis Date  . Allergy   . Arthritis   . Benign head  tremor   . Diabetes mellitus ([place])    diet controlled  . Fibrocystic breast disease   . Heart murmur   . History of abnormal Pap smear   . Hypercholesterolemia   . Hyperthyroidism    s/p ablation     Patient Active Problem List   Diagnosis Date Noted  . Gastroenteritis 05/27/2017  . Cough 02/13/2017  . Vitamin D deficiency 06/08/2016  . S/P arthroscopy of knee 11/03/2015  . Internal derangement of left knee 09/12/2015  . Murmur 08/21/2015  . Health care maintenance 02/17/2015  . Postablative hypothyroidism 04/27/2014  . Thyroid nodule 02/05/2014  . Osteopenia 08/09/2012  . Diabetes mellitus ([place]) 08/04/2012  . Hypercholesterolemia 08/04/2012  . Hyperthyroidism 08/04/2012     Past Surgical History:  Procedure Laterality Date  . BREAST BIOPSY  70's   benign  . CARPAL TUNNEL RELEASE     right hand  . KNEE ARTHROSCOPY Left 09/12/2015   Procedure: ARTHROSCOPY LEFT KNEE, PARTIAL MEDIAL MENISECTOMY, PARTIAL LATERAL MENISECTOMY, CHONDROPLASTY MEDIAL AND LATERAL;  Surgeon: Dereck Leep, MD;  Location: [facility] ORS;  Service: Orthopedics;  Laterality: Left;  . SHOULDER ARTHROSCOPY    . TONSILLECTOMY      Family History        Family Status  Relation Name Status  . Father  Deceased  . Mother  Deceased  . Brother  Deceased  . Brother  Deceased  . Sister  Deceased  . Neg Hx  (Not Specified)        Her family history includes Diabetes in her brother, brother, father, and sister; Heart disease in her brother, father, and mother. There is no history of Breast cancer or Colon cancer.      Allergies  Allergen Reactions  . Mysoline [Primidone] Other (See Comments)    "unknown"  . Penicillins Rash     Current Outpatient Medications:  .  cholecalciferol (VITAMIN D) 1000 units tablet, Take 1,000 Units by mouth daily., Disp: , Rfl:  .  SYNTHROID 88 MCG tablet, TAKE ONE TABLET EVERY DAY BEFORE BREAKFAST, Disp: 30 tablet, Rfl: 5   Patient Care Team: Virginia Crews, MD as PCP - General (Family Medicine)      Objective:   Vitals: BP (!) 110/58 (BP Location: Right Arm, Patient Position: Sitting, Cuff Size: Normal)   Pulse 61   Temp 98.1 F (36.7 C) (Oral)   Ht 5\' 3"  (1.6 m)   Wt 135 lb 9.6 oz (61.5 kg)   SpO2 96%   BMI 24.02 kg/m    Vitals:   03/30/18 0812  BP: (!) 110/58  Pulse: 61  Temp: 98.1 F (36.7 C)  TempSrc: Oral  SpO2: 96%  Weight: 135 lb 9.6 oz (61.5 kg)  Height: 5\' 3"  (1.6 m)     Physical Exam  Constitutional: She is oriented to person, place, and time. She appears well-developed and well-nourished. No distress.  HENT:  Head: Normocephalic and atraumatic.  Right Ear: External ear normal.  Left Ear: External ear normal.   Nose: Nose normal.  Mouth/Throat: Oropharynx is clear and moist.  Eyes: Pupils are equal, round, and reactive to light. Conjunctivae and EOM are normal. No scleral icterus.  Neck: Neck supple. No thyromegaly present.  Cardiovascular: Normal rate, regular rhythm, normal heart sounds and intact distal pulses.  Pulmonary/Chest: Effort normal and breath sounds normal. No respiratory distress. She has no wheezes. She has no rales.  Abdominal: Soft. Bowel sounds are normal. She exhibits no distension. There is no tenderness. There  is no rebound and no guarding.  Musculoskeletal: She exhibits no edema or deformity.  Lymphadenopathy:    She has no cervical adenopathy.  Neurological: She is alert and oriented to person, place, and time.  Skin: Skin is warm and dry. Capillary refill takes less than 2 seconds. No rash noted.  Psychiatric: She has a normal mood and affect. Her behavior is normal.  Vitals reviewed.    Depression Screen PHQ 2/9 Scores 03/30/2018 06/11/2017 09/24/2016 06/06/2016  PHQ - 2 Score 0 0 0 0  PHQ- 9 Score 0 0 - -      Assessment & Plan:     Routine Health Maintenance and Physical Exam  Exercise Activities and Dietary recommendations Goals    . Healthy Lifestyle     Maintain drinking plenty of fluids, exercise regimen and low carb foods.       Immunization History  Administered Date(s) Administered  . Influenza Split 04/27/2012, 05/16/2013, 05/11/2014  . Influenza,inj,Quad PF,6+ Mos 06/11/2017  . Influenza-Unspecified 04/24/2014, 06/04/2015, 06/24/2016  . Pneumococcal Conjugate-13 07/04/2013  . Pneumococcal Polysaccharide-23 09/12/2016    Health Maintenance  Topic Date Due  . TETANUS/TDAP  11/29/1949  . DEXA SCAN  11/30/1995  . URINE MICROALBUMIN  03/07/2016  . FOOT EXAM  06/26/2016  . HEMOGLOBIN A1C  12/22/2017  . OPHTHALMOLOGY EXAM  01/01/2018  . INFLUENZA VACCINE  02/25/2018  . PNA vac Low Risk Adult  Completed  . MAMMOGRAM  Discontinued      Discussed health benefits of physical activity, and encouraged her to engage in regular exercise appropriate for her age and condition.    --------------------------------------------------------------------  Problem List Items Addressed This Visit      Endocrine   Diabetes mellitus ([place])    Reviewed most recent A1c Patient to send report from upcoming eye exam next time foot exam completed today Recheck A1c Check urine microalbumin Continue low-carb diet and exercise      Relevant Orders   Hemoglobin A1c   POCT UA - Microalbumin (Completed)   Postablative hypothyroidism - Primary    Well-controlled and asymptomatic Last TSH reviewed Continue Synthroid 88 mcg daily Recheck TSH      Relevant Orders   TSH     Musculoskeletal and Integument   Osteopenia    Continue vitamin D supplement and weightbearing exercise        Other   Hypercholesterolemia    Reviewed patient's most recent lipid panel and CMP She is following a low-cholesterol diet and exercising regularly It appears she has discussed statins with her previous PCP multiple times and she has declined them      Vitamin D deficiency    Continue daily vitamin D supplement       Other Visit Diagnoses    Need for influenza vaccination       Relevant Orders   Flu vaccine HIGH DOSE PF (Completed)       Return in about 3 months (around 06/29/2018) for AWV/CPE (after 11/15).   The entirety of the information documented in the History of Present Illness, Review of Systems and Physical Exam were personally obtained by me. Portions of this information were initially documented by Shannon Mckinney, CMA and reviewed by me for thoroughness and accuracy.    Virginia Crews, MD, MPH [facility] 03/30/2018 9:48 AM

## 2018-03-31 LAB — HEMOGLOBIN A1C
Est. average glucose Bld gHb Est-mCnc: 134 mg/dL
Hgb A1c MFr Bld: 6.3 % — ABNORMAL HIGH (ref 4.8–5.6)

## 2018-03-31 LAB — TSH: TSH: 0.367 u[IU]/mL — ABNORMAL LOW (ref 0.450–4.500)

## 2018-04-01 ENCOUNTER — Other Ambulatory Visit: Payer: Self-pay | Admitting: Family Medicine

## 2018-04-01 ENCOUNTER — Telehealth: Payer: Self-pay

## 2018-04-01 DIAGNOSIS — E119 Type 2 diabetes mellitus without complications: Secondary | ICD-10-CM | POA: Diagnosis not present

## 2018-04-01 DIAGNOSIS — E890 Postprocedural hypothyroidism: Secondary | ICD-10-CM

## 2018-04-01 LAB — HM DIABETES EYE EXAM

## 2018-04-01 MED ORDER — LEVOTHYROXINE SODIUM 75 MCG PO TABS: 75.0000 ug | ORAL_TABLET | Freq: Every day | ORAL | 3 refills | Status: DC

## 2018-04-01 NOTE — Telephone Encounter (Signed)
-----   Message from Virginia Crews, MD sent at 04/01/2018 11:34 AM EDT ----- TSH is slightly low.  This likely means that dose of Synthroid is slightly too high.  We will send new prescription for lower dose of Synthroid, 75 mcg daily.  Should recheck TSH in ~8 weeks after dose change.  A1c is stable and well controlled at 6.3  Virginia Crews, MD, MPH [facility] 04/01/2018 11:34 AM

## 2018-04-01 NOTE — Telephone Encounter (Signed)
Patient advised. Labs ordered as- future.

## 2018-04-27 ENCOUNTER — Other Ambulatory Visit: Payer: Medicare Other

## 2018-04-29 ENCOUNTER — Ambulatory Visit: Payer: Medicare Other | Admitting: Internal Medicine

## 2018-05-28 ENCOUNTER — Other Ambulatory Visit: Payer: Self-pay

## 2018-05-28 DIAGNOSIS — E890 Postprocedural hypothyroidism: Secondary | ICD-10-CM | POA: Diagnosis not present

## 2018-05-29 LAB — TSH: TSH: 1.7 u[IU]/mL (ref 0.450–4.500)

## 2018-06-14 ENCOUNTER — Ambulatory Visit: Payer: BLUE CROSS/BLUE SHIELD

## 2018-06-21 ENCOUNTER — Ambulatory Visit (INDEPENDENT_AMBULATORY_CARE_PROVIDER_SITE_OTHER): Payer: Medicare Other

## 2018-06-21 ENCOUNTER — Ambulatory Visit: Payer: Medicare Other | Admitting: Family Medicine

## 2018-06-21 VITALS — BP 122/62 | HR 73 | Temp 97.9°F | Ht 63.0 in | Wt 133.2 lb

## 2018-06-21 DIAGNOSIS — E2839 Other primary ovarian failure: Secondary | ICD-10-CM | POA: Diagnosis not present

## 2018-06-21 DIAGNOSIS — Z0000 Encounter for general adult medical examination without abnormal findings: Secondary | ICD-10-CM | POA: Diagnosis not present

## 2018-06-21 NOTE — Progress Notes (Signed)
Subjective:   Shannon Mckinney is a 84 y.o. female who presents for Medicare Annual (Subsequent) preventive examination.  Review of Systems:  N/A Cardiac Risk Factors include: advanced age (>85men, >64 women);diabetes mellitus     Objective:     Vitals: BP 122/62 (BP Location: Right Arm)   Pulse 73   Temp 97.9 F (36.6 C) (Oral)   Ht 5\' 3"  (1.6 m)   Wt 133 lb 3.2 oz (60.4 kg)   BMI 23.60 kg/m   Body mass index is 23.6 kg/m.  Advanced Directives 06/21/2018 06/11/2017 06/06/2016 09/12/2015 09/04/2015  Does Patient Have a Medical Advance Directive? Yes Yes Yes Yes Yes  Type of Advance Directive Living will;Healthcare Power of Attorney Living will [place];Living will [place];Living will [place];Living will  Does patient want to make changes to medical advance directive? - No - Patient declined - No - Patient declined No - Patient declined  Copy of Major in Chart? No - copy requested - No - copy requested No - copy requested No - copy requested    Tobacco Social History   Tobacco Use  Smoking Status Never Smoker  Smokeless Tobacco Never Used     Counseling given: Not Answered   Clinical Intake:  Pre-visit preparation completed: Yes  Pain : No/denies pain Pain Score: 0-No pain    Diabetes:  Is the patient diabetic? Prediabetic.  If diabetic, was a CBG obtained today?  No  Did the patient bring in their glucometer from home?  No  How often do you monitor your CBG's? Does not.   Financial Strains and Diabetes Management:  Are you having any financial strains with the device, your supplies or your medication? N/A  Does the patient want to be seen by Chronic Care Management for management of their diabetes?  No  Would the patient like to be referred to a Nutritionist or for Diabetic Management?  No   Diabetic Exams:  Diabetic Eye Exam: Completed 04/01/18.   Diabetic Foot Exam:  Completed 06/27/15. Pt has been advised about the importance in completing this exam. Note made to f/u on this at next OV.   Nutritional Status: BMI of 19-24  Normal Nutritional Risks: None   How often do you need to have someone help you when you read instructions, pamphlets, or other written materials from your doctor or pharmacy?: 1 - Never  Interpreter Needed?: No  Information entered by :: [facility], LPN  Past Medical History:  Diagnosis Date  . Allergy   . Arthritis   . Benign head tremor   . Diabetes mellitus ([place])    diet controlled  . Fibrocystic breast disease   . Heart murmur   . History of abnormal Pap smear   . Hypercholesterolemia   . Hyperthyroidism    s/p ablation   Past Surgical History:  Procedure Laterality Date  . BREAST BIOPSY  70's   benign  . CARPAL TUNNEL RELEASE     right hand  . KNEE ARTHROSCOPY Left 09/12/2015   Procedure: ARTHROSCOPY LEFT KNEE, PARTIAL MEDIAL MENISECTOMY, PARTIAL LATERAL MENISECTOMY, CHONDROPLASTY MEDIAL AND LATERAL;  Surgeon: Dereck Leep, MD;  Location: [facility] ORS;  Service: Orthopedics;  Laterality: Left;  . SHOULDER ARTHROSCOPY    . TONSILLECTOMY     Family History  Problem Relation Age of Onset  . Heart disease Father        myocardial infarction - died 52  . Diabetes Father   .  Heart disease Mother        myocardial infarction  . Heart disease Brother        x2  . Diabetes Brother   . Diabetes Brother   . Diabetes Sister   . Breast cancer Neg Hx   . Colon cancer Neg Hx    Social History   Socioeconomic History  . Marital status: Married    Spouse name: Not on file  . Number of children: 2  . Years of education: Not on file  . Highest education level: Bachelor's degree (e.g., BA, AB, BS)  Occupational History  . Occupation: retired Arboriculturist   Social Needs  . Financial resource strain: Not hard at all  . Food insecurity:    Worry: Never true    Inability: Never true  . Transportation needs:     Medical: No    Non-medical: No  Tobacco Use  . Smoking status: Never Smoker  . Smokeless tobacco: Never Used  Substance and Sexual Activity  . Alcohol use: Yes    Alcohol/week: 0.0 standard drinks    Comment: occasional glass of wine  . Drug use: No  . Sexual activity: Not Currently  Lifestyle  . Physical activity:    Days per week: 0 days    Minutes per session: 0 min  . Stress: Not at all  Relationships  . Social connections:    Talks on phone: Patient refused    Gets together: Patient refused    Attends religious service: Patient refused    Active member of club or organization: Patient refused    Attends meetings of clubs or organizations: Patient refused    Relationship status: Patient refused  Other Topics Concern  . Not on file  Social History Narrative  . Not on file    Outpatient Encounter Medications as of 06/21/2018  Medication Sig  . cholecalciferol (VITAMIN D) 1000 units tablet Take 1,000 Units by mouth daily.   Marland Kitchen levothyroxine (SYNTHROID) 75 MCG tablet Take 1 tablet (75 mcg total) by mouth daily before breakfast.   No facility-administered encounter medications on file as of 06/21/2018.     Activities of Daily Living In your present state of health, do you have any difficulty performing the following activities: 06/21/2018  Hearing? Y  Comment Has bilateral hearing aids however does not wear them all the time.  Vision? N  Difficulty concentrating or making decisions? Y  Walking or climbing stairs? N  Dressing or bathing? N  Doing errands, shopping? N  Preparing Food and eating ? N  Using the Toilet? N  In the past six months, have you accidently leaked urine? N  Do you have problems with loss of bowel control? N  Managing your Medications? N  Managing your Finances? N  Housekeeping or managing your Housekeeping? N  Some recent data might be hidden    Patient Care Team: Bacigalupo, Dionne Bucy, MD as PCP - General (Family Medicine) [facility], [facility])    Assessment:   This is a routine wellness examination for Shannon Mckinney.  Exercise Activities and Dietary recommendations Current Exercise Habits: The patient does not participate in regular exercise at present, Exercise limited by: None identified  Goals    . DIET - INCREASE WATER INTAKE     Recommend to drink at least 6-8 8oz glasses of water per day.     Marland Kitchen Healthy Lifestyle     Maintain drinking plenty of fluids, exercise regimen and low carb foods.  Fall Risk Fall Risk  06/21/2018 03/30/2018 06/11/2017 09/24/2016 06/06/2016  Falls in the past year? 1 Yes No No No  Number falls in past yr: 0 1 - - -  Injury with Fall? 0 No - - -  Follow up Falls prevention discussed - - - -   FALL RISK PREVENTION PERTAINING TO THE HOME:  Any stairs in or around the home WITH handrails? No  Home free of loose throw rugs in walkways, pet beds, electrical cords, etc? Yes  Adequate lighting in your home to reduce risk of falls? Yes   ASSISTIVE DEVICES UTILIZED TO PREVENT FALLS:  Life alert? Yes  Use of a cane, walker or w/c? No  Grab bars in the bathroom? Yes  Shower chair or bench in shower? Yes  Elevated toilet seat or a handicapped toilet? Yes    TIMED UP AND GO:  Was the test performed? No .    Depression Screen PHQ 2/9 Scores 06/21/2018 03/30/2018 06/11/2017 09/24/2016  PHQ - 2 Score 1 0 0 0  PHQ- 9 Score - 0 0 -     Cognitive Function MMSE - Mini Mental State Exam 06/11/2017 06/06/2016  Orientation to time 5 5  Orientation to Place 5 5  Registration 3 3  Attention/ Calculation 5 5  Recall 1 3  Language- name 2 objects 2 2  Language- repeat 1 1  Language- follow 3 step command 3 3  Language- read & follow direction 1 1  Write a sentence 1 1  Copy design 1 1  Total score 28 30     6CIT Screen 06/21/2018 06/06/2016  What Year? 0 points 0 points  What month? 0 points 0 points  What time? 0 points 0 points  Count back from 20 0 points 0 points  Months  in reverse 0 points 0 points  Repeat phrase 0 points 0 points  Total Score 0 0    Immunization History  Administered Date(s) Administered  . Influenza Split 04/27/2012, 05/16/2013, 05/11/2014  . Influenza, High Dose Seasonal PF 03/30/2018  . Influenza,inj,Quad PF,6+ Mos 06/11/2017  . Influenza-Unspecified 04/24/2014, 06/04/2015, 06/24/2016  . Pneumococcal Conjugate-13 07/04/2013  . Pneumococcal Polysaccharide-23 09/12/2016    Qualifies for Shingles Vaccine? Yes . Due for Shingrix. Education has been provided regarding the importance of this vaccine. Pt has been advised to call insurance company to determine out of pocket expense. Advised may also receive vaccine at local pharmacy or Health Dept. Verbalized acceptance and understanding.  Tdap: Although this vaccine is not a covered service during a Wellness Exam, does the patient still wish to receive this vaccine today?  No .  Education has been provided regarding the importance of this vaccine. Advised may receive this vaccine at local pharmacy or Health Dept. Aware to provide a copy of the vaccination record if obtained from local pharmacy or Health Dept. Verbalized acceptance and understanding.  Flu Vaccine: Up to date  Pneumococcal Vaccine: Up to date   Screening Tests Health Maintenance  Topic Date Due  . DEXA SCAN  11/30/1995  . FOOT EXAM  06/26/2016  . TETANUS/TDAP  03/31/2019 (Originally 11/29/1947)  . HEMOGLOBIN A1C  09/28/2018  . URINE MICROALBUMIN  03/31/2019  . OPHTHALMOLOGY EXAM  04/02/2019  . INFLUENZA VACCINE  Completed  . PNA vac Low Risk Adult  Completed  . MAMMOGRAM  Discontinued    Cancer Screenings:  Colorectal Screening: No longer required.   Mammogram: No longer required.    Bone Density: Ordered today. Pt aware  the office will call re: appt.  Lung Cancer Screening: (Low Dose CT Chest recommended if Age 81-80 years, 30 pack-year currently smoking OR have quit w/in 15years.) does not qualify.     Additional Screening:  Vision Screening: Recommended annual ophthalmology exams for early detection of glaucoma and other disorders of the eye.  Dental Screening: Recommended annual dental exams for proper oral hygiene  Community Resource Referral:  CRR required this visit?  No       Plan:  I have personally reviewed and addressed the Medicare Annual Wellness questionnaire and have noted the following in the patient's chart:  A. Medical and social history B. Use of alcohol, tobacco or illicit drugs  C. Current medications and supplements D. Functional ability and status E.  Nutritional status F.  Physical activity G. Advance directives H. List of other physicians I.  Hospitalizations, surgeries, and ER visits in previous 12 months J.  [place] such as hearing and vision if needed, cognitive and depression L. Referrals and appointments - none  In addition, I have reviewed and discussed with patient certain preventive protocols, quality metrics, and best practice recommendations. A written personalized care plan for preventive services as well as general preventive health recommendations were provided to patient.  See attached scanned questionnaire for additional information.   Signed,  Fabio Neighbors, LPN Nurse Health Advisor   Nurse Recommendations: Pt needs a diabetic foot exam at next OV. Pt declined tetanus vaccine today.

## 2018-06-21 NOTE — Patient Instructions (Addendum)
Shannon Mckinney , Thank you for taking time to come for your Medicare Wellness Visit. I appreciate your ongoing commitment to your health goals. Please review the following plan we discussed and let me know if I can assist you in the future.   Screening recommendations/referrals: Colonoscopy: No longer required.  Mammogram: No longer required.  Bone Density: Referral sent today.  Recommended yearly ophthalmology/optometry visit for glaucoma screening and checkup Recommended yearly dental visit for hygiene and checkup  Vaccinations: Influenza vaccine: Up to date Pneumococcal vaccine: Completed series Tdap vaccine: Pt declines today.  Shingles vaccine: Pt declines today.     Advanced directives: Please bring a copy of your POA (Power of Attorney) and/or Living Will to your next appointment.   Conditions/risks identified: Recommend to increase water intake to 6-8 glasses a day.   Next appointment: 07/27/18 @ 10 AM with Dr Brita Romp.    Preventive Care 82 Years and Older, Female Preventive care refers to lifestyle choices and visits with your health care provider that can promote health and wellness. What does preventive care include?  A yearly physical exam. This is also called an annual well check.  Dental exams once or twice a year.  Routine eye exams. Ask your health care provider how often you should have your eyes checked.  Personal lifestyle choices, including:  Daily care of your teeth and gums.  Regular physical activity.  Eating a healthy diet.  Avoiding tobacco and drug use.  Limiting alcohol use.  Practicing safe sex.  Taking low-dose aspirin every day.  Taking vitamin and mineral supplements as recommended by your health care provider. What happens during an annual well check? The services and screenings done by your health care provider during your annual well check will depend on your age, overall health, lifestyle risk factors, and family history of  disease. Counseling  Your health care provider may ask you questions about your:  Alcohol use.  Tobacco use.  Drug use.  Emotional well-being.  Home and relationship well-being.  Sexual activity.  Eating habits.  History of falls.  Memory and ability to understand (cognition).  Work and work Statistician.  Reproductive health. Screening  You may have the following tests or measurements:  Height, weight, and BMI.  Blood pressure.  Lipid and cholesterol levels. These may be checked every 5 years, or more frequently if you are over 71 years old.  Skin check.  Lung cancer screening. You may have this screening every year starting at age 28 if you have a 30-pack-year history of smoking and currently smoke or have quit within the past 15 years.  Fecal occult blood test (FOBT) of the stool. You may have this test every year starting at age 65.  Flexible sigmoidoscopy or colonoscopy. You may have a sigmoidoscopy every 5 years or a colonoscopy every 10 years starting at age 23.  Hepatitis C blood test.  Hepatitis B blood test.  Sexually transmitted disease (STD) testing.  Diabetes screening. This is done by checking your blood sugar (glucose) after you have not eaten for a while (fasting). You may have this done every 1-3 years.  Bone density scan. This is done to screen for osteoporosis. You may have this done starting at age 26.  Mammogram. This may be done every 1-2 years. Talk to your health care provider about how often you should have regular mammograms. Talk with your health care provider about your test results, treatment options, and if necessary, the need for more tests. Vaccines  Your  health care provider may recommend certain vaccines, such as:  Influenza vaccine. This is recommended every year.  Tetanus, diphtheria, and acellular pertussis (Tdap, Td) vaccine. You may need a Td booster every 10 years.  Zoster vaccine. You may need this after age  9.  Pneumococcal 13-valent conjugate (PCV13) vaccine. One dose is recommended after age 54.  Pneumococcal polysaccharide (PPSV23) vaccine. One dose is recommended after age 68. Talk to your health care provider about which screenings and vaccines you need and how often you need them. This information is not intended to replace advice given to you by your health care provider. Make sure you discuss any questions you have with your health care provider. Document Released: 08/10/2015 Document Revised: 04/02/2016 Document Reviewed: 05/15/2015 Elsevier Interactive Patient Education  2017 [place] Prevention in the Home Falls can cause injuries. They can happen to people of all ages. There are many things you can do to make your home safe and to help prevent falls. What can I do on the outside of my home?  Regularly fix the edges of walkways and driveways and fix any cracks.  Remove anything that might make you trip as you walk through a door, such as a raised step or threshold.  Trim any bushes or trees on the path to your home.  Use bright outdoor lighting.  Clear any walking paths of anything that might make someone trip, such as rocks or tools.  Regularly check to see if handrails are loose or broken. Make sure that both sides of any steps have handrails.  Any raised decks and porches should have guardrails on the edges.  Have any leaves, snow, or ice cleared regularly.  Use sand or salt on walking paths during winter.  Clean up any spills in your garage right away. This includes oil or grease spills. What can I do in the bathroom?  Use night lights.  Install grab bars by the toilet and in the tub and shower. Do not use towel bars as grab bars.  Use non-skid mats or decals in the tub or shower.  If you need to sit down in the shower, use a plastic, non-slip stool.  Keep the floor dry. Clean up any water that spills on the floor as soon as it happens.  Remove  soap buildup in the tub or shower regularly.  Attach bath mats securely with double-sided non-slip rug tape.  Do not have throw rugs and other things on the floor that can make you trip. What can I do in the bedroom?  Use night lights.  Make sure that you have a light by your bed that is easy to reach.  Do not use any sheets or blankets that are too big for your bed. They should not hang down onto the floor.  Have a firm chair that has side arms. You can use this for support while you get dressed.  Do not have throw rugs and other things on the floor that can make you trip. What can I do in the kitchen?  Clean up any spills right away.  Avoid walking on wet floors.  Keep items that you use a lot in easy-to-reach places.  If you need to reach something above you, use a strong step stool that has a grab bar.  Keep electrical cords out of the way.  Do not use floor polish or wax that makes floors slippery. If you must use wax, use non-skid floor wax.  Do not  have throw rugs and other things on the floor that can make you trip. What can I do with my stairs?  Do not leave any items on the stairs.  Make sure that there are handrails on both sides of the stairs and use them. Fix handrails that are broken or loose. Make sure that handrails are as long as the stairways.  Check any carpeting to make sure that it is firmly attached to the stairs. Fix any carpet that is loose or worn.  Avoid having throw rugs at the top or bottom of the stairs. If you do have throw rugs, attach them to the floor with carpet tape.  Make sure that you have a light switch at the top of the stairs and the bottom of the stairs. If you do not have them, ask someone to add them for you. What else can I do to help prevent falls?  Wear shoes that:  Do not have high heels.  Have rubber bottoms.  Are comfortable and fit you well.  Are closed at the toe. Do not wear sandals.  If you use a  stepladder:  Make sure that it is fully opened. Do not climb a closed stepladder.  Make sure that both sides of the stepladder are locked into place.  Ask someone to hold it for you, if possible.  Clearly mark and make sure that you can see:  Any grab bars or handrails.  First and last steps.  Where the edge of each step is.  Use tools that help you move around (mobility aids) if they are needed. These include:  Canes.  Walkers.  Scooters.  Crutches.  Turn on the lights when you go into a dark area. Replace any light bulbs as soon as they burn out.  Set up your furniture so you have a clear path. Avoid moving your furniture around.  If any of your floors are uneven, fix them.  If there are any pets around you, be aware of where they are.  Review your medicines with your doctor. Some medicines can make you feel dizzy. This can increase your chance of falling. Ask your doctor what other things that you can do to help prevent falls. This information is not intended to replace advice given to you by your health care provider. Make sure you discuss any questions you have with your health care provider. Document Released: 05/10/2009 Document Revised: 12/20/2015 Document Reviewed: 08/18/2014 Elsevier Interactive Patient Education  2017 [employer].

## 2018-07-27 ENCOUNTER — Encounter: Payer: Self-pay | Admitting: Family Medicine

## 2018-07-27 ENCOUNTER — Ambulatory Visit (INDEPENDENT_AMBULATORY_CARE_PROVIDER_SITE_OTHER): Payer: Medicare Other | Admitting: Family Medicine

## 2018-07-27 DIAGNOSIS — M8580 Other specified disorders of bone density and structure, unspecified site: Secondary | ICD-10-CM

## 2018-07-27 DIAGNOSIS — E7800 Pure hypercholesterolemia, unspecified: Secondary | ICD-10-CM

## 2018-07-27 DIAGNOSIS — E890 Postprocedural hypothyroidism: Secondary | ICD-10-CM

## 2018-07-27 DIAGNOSIS — E119 Type 2 diabetes mellitus without complications: Secondary | ICD-10-CM | POA: Diagnosis not present

## 2018-07-27 NOTE — Assessment & Plan Note (Signed)
Continue vitamin D supplement and weightbearing exercise We will review DEXA when available next month

## 2018-07-27 NOTE — Assessment & Plan Note (Signed)
Reviewed most recent A1c Previously well controlled with low-carb diet and exercise Not currently on medications Recheck A1c Foot exam completed today Up-to-date on urine microalbumin and vaccines and eye exam Follow-up in 6 months

## 2018-07-27 NOTE — Patient Instructions (Signed)
Preventive Care 82 Years and Older, Female Preventive care refers to lifestyle choices and visits with your health care provider that can promote health and wellness. What does preventive care include?  A yearly physical exam. This is also called an annual well check.  Dental exams once or twice a year.  Routine eye exams. Ask your health care provider how often you should have your eyes checked.  Personal lifestyle choices, including: ? Daily care of your teeth and gums. ? Regular physical activity. ? Eating a healthy diet. ? Avoiding tobacco and drug use. ? Limiting alcohol use. ? Practicing safe sex. ? Taking low-dose aspirin every day. ? Taking vitamin and mineral supplements as recommended by your health care provider. What happens during an annual well check? The services and screenings done by your health care provider during your annual well check will depend on your age, overall health, lifestyle risk factors, and family history of disease. Counseling Your health care provider may ask you questions about your:  Alcohol use.  Tobacco use.  Drug use.  Emotional well-being.  Home and relationship well-being.  Sexual activity.  Eating habits.  History of falls.  Memory and ability to understand (cognition).  Work and work Statistician.  Reproductive health.  Screening You may have the following tests or measurements:  Height, weight, and BMI.  Blood pressure.  Lipid and cholesterol levels. These may be checked every 5 years, or more frequently if you are over 30 years old.  Skin check.  Lung cancer screening. You may have this screening every year starting at age 82 if you have a 30-pack-year history of smoking and currently smoke or have quit within the past 15 years.  Colorectal cancer screening. All adults should have this screening starting at age 82 and continuing until age 82. You will have tests every 1-10 years, depending on your results and the  type of screening test. People at increased risk should start screening at an earlier age. Screening tests may include: ? Guaiac-based fecal occult blood testing. ? Fecal immunochemical test (FIT). ? Stool DNA test. ? Virtual colonoscopy. ? Sigmoidoscopy. During this test, a flexible tube with a tiny camera (sigmoidoscope) is used to examine your rectum and lower colon. The sigmoidoscope is inserted through your anus into your rectum and lower colon. ? Colonoscopy. During this test, a long, thin, flexible tube with a tiny camera (colonoscope) is used to examine your entire colon and rectum.  Hepatitis C blood test.  Hepatitis B blood test.  Sexually transmitted disease (STD) testing.  Diabetes screening. This is done by checking your blood sugar (glucose) after you have not eaten for a while (fasting). You may have this done every 1-3 years.  Bone density scan. This is done to screen for osteoporosis. You may have this done starting at age 82.  Mammogram. This may be done every 1-2 years. Talk to your health care provider about how often you should have regular mammograms. Talk with your health care provider about your test results, treatment options, and if necessary, the need for more tests. Vaccines Your health care provider may recommend certain vaccines, such as:  Influenza vaccine. This is recommended every year.  Tetanus, diphtheria, and acellular pertussis (Tdap, Td) vaccine. You may need a Td booster every 10 years.  Varicella vaccine. You may need this if you have not been vaccinated.  Zoster vaccine. You may need this after age 82.  Measles, mumps, and rubella (MMR) vaccine. You may need at least  one dose of MMR if you were born in 1957 or later. You may also need a second dose.  Pneumococcal 13-valent conjugate (PCV13) vaccine. One dose is recommended after age 24.  Pneumococcal polysaccharide (PPSV23) vaccine. One dose is recommended after age 24.  Meningococcal  vaccine. You may need this if you have certain conditions.  Hepatitis A vaccine. You may need this if you have certain conditions or if you travel or work in places where you may be exposed to hepatitis A.  Hepatitis B vaccine. You may need this if you have certain conditions or if you travel or work in places where you may be exposed to hepatitis B.  Haemophilus influenzae type b (Hib) vaccine. You may need this if you have certain conditions. Talk to your health care provider about which screenings and vaccines you need and how often you need them. This information is not intended to replace advice given to you by your health care provider. Make sure you discuss any questions you have with your health care provider. Document Released: 08/10/2015 Document Revised: 09/03/2017 Document Reviewed: 05/15/2015 Elsevier Interactive Patient Education  2019 [employer].

## 2018-07-27 NOTE — Assessment & Plan Note (Signed)
Well-controlled and asymptomatic Last TSH reviewed Continue Synthroid at current dose Follow-up in 6 months with recheck TSH

## 2018-07-27 NOTE — Progress Notes (Signed)
Patient: Shannon Mckinney, Female    DOB: 04-Sep-1928, 84 y.o.   MRN: [id number] Visit Date: 07/27/2018  Today's Provider: Lavon Paganini, MD   Chief Complaint  Patient presents with  . Diabetes  . Hyperlipidemia   Subjective:  I, [name], CMA, am acting as a scribe for Lavon Paganini, MD.   Patient had a AWE with McKenzie on 06/21/2018    Chronic disease f/u Shannon Mckinney is a 84 y.o. female. She feels well. She reports exercising lightly. She reports she is sleeping well.   T2DM - Checking BG at home: No - Medications: None - Compliance: N/A - Diet: Low-carb - eye exam: 04/01/2018 - foot exam: Needs - microalbumin: Done 03/30/2018 - denies symptoms of hypoglycemia, polyuria, polydipsia, numbness extremities, foot ulcers/trauma   HLD - medications: None - compliance: N/A -Diet controlled  Post ablative hypothyroidism and thyroid nodule: Taking Synthroid 75 mcg daily with good compliance.  Denies any side effects.  Denies hair/skin dryness, constipation, diarrhea, cold/heat intolerance, weight loss, weight gain  BP: Believes this is elevated this morning due to stress of driving to our office when she does not typically drive.  She has never had high blood pressure in the past  Has DEXA coming up next month -----------------------------------------------------------  Review of Systems  Constitutional: Negative.   HENT: Negative.   Eyes: Negative.   Respiratory: Negative.   Cardiovascular: Negative.   Gastrointestinal: Negative.   Endocrine: Negative.   Genitourinary: Negative.   Musculoskeletal: Positive for arthralgias. Negative for back pain, gait problem, joint swelling, myalgias, neck pain and neck stiffness.  Skin: Negative.   Allergic/Immunologic: Negative.   Neurological: Positive for tremors. Negative for dizziness, seizures, syncope, facial asymmetry, speech difficulty, weakness, light-headedness, numbness and headaches.  Hematological:  Negative.   Psychiatric/Behavioral: Positive for decreased concentration. Negative for agitation, behavioral problems, confusion, dysphoric mood, hallucinations, self-injury, sleep disturbance and suicidal ideas. The patient is not nervous/anxious and is not hyperactive.     Social History   Socioeconomic History  . Marital status: Married    Spouse name: Not on file  . Number of children: 2  . Years of education: Not on file  . Highest education level: Bachelor's degree (e.g., BA, AB, BS)  Occupational History  . Occupation: retired Arboriculturist   Social Needs  . Financial resource strain: Not hard at all  . Food insecurity:    Worry: Never true    Inability: Never true  . Transportation needs:    Medical: No    Non-medical: No  Tobacco Use  . Smoking status: Never Smoker  . Smokeless tobacco: Never Used  Substance and Sexual Activity  . Alcohol use: Yes    Alcohol/week: 0.0 standard drinks    Comment: occasional glass of wine  . Drug use: No  . Sexual activity: Not Currently  Lifestyle  . Physical activity:    Days per week: 0 days    Minutes per session: 0 min  . Stress: Not at all  Relationships  . Social connections:    Talks on phone: Patient refused    Gets together: Patient refused    Attends religious service: Patient refused    Active member of club or organization: Patient refused    Attends meetings of clubs or organizations: Patient refused    Relationship status: Patient refused  . Intimate partner violence:    Fear of current or ex partner: Patient refused    Emotionally abused: Patient refused  Physically abused: Patient refused    Forced sexual activity: Patient refused  Other Topics Concern  . Not on file  Social History Narrative  . Not on file    Past Medical History:  Diagnosis Date  . Allergy   . Arthritis   . Benign head tremor   . Diabetes mellitus ([place])    diet controlled  . Fibrocystic breast disease   . Heart murmur   .  History of abnormal Pap smear   . Hypercholesterolemia   . Hyperthyroidism    s/p ablation     Patient Active Problem List   Diagnosis Date Noted  . Gastroenteritis 05/27/2017  . Vitamin D deficiency 06/08/2016  . S/P arthroscopy of knee 11/03/2015  . Murmur 08/21/2015  . Health care maintenance 02/17/2015  . Postablative hypothyroidism 04/27/2014  . Thyroid nodule 02/05/2014  . Osteopenia 08/09/2012  . Diabetes mellitus ([place]) 08/04/2012  . Hypercholesterolemia 08/04/2012    Past Surgical History:  Procedure Laterality Date  . BREAST BIOPSY  70's   benign  . CARPAL TUNNEL RELEASE     right hand  . KNEE ARTHROSCOPY Left 09/12/2015   Procedure: ARTHROSCOPY LEFT KNEE, PARTIAL MEDIAL MENISECTOMY, PARTIAL LATERAL MENISECTOMY, CHONDROPLASTY MEDIAL AND LATERAL;  Surgeon: Dereck Leep, MD;  Location: [facility] ORS;  Service: Orthopedics;  Laterality: Left;  . SHOULDER ARTHROSCOPY    . TONSILLECTOMY      Her family history includes Diabetes in her brother, brother, father, and sister; Heart disease in her brother, father, and mother. There is no history of Breast cancer or Colon cancer.      Current Outpatient Medications:  .  cholecalciferol (VITAMIN D) 1000 units tablet, Take 1,000 Units by mouth daily. , Disp: , Rfl:  .  levothyroxine (SYNTHROID) 75 MCG tablet, Take 1 tablet (75 mcg total) by mouth daily before breakfast., Disp: 90 tablet, Rfl: 3  Patient Care Team: Virginia Crews, MD as PCP - General (Family Medicine) [place], [place] (Optometry)     Objective:   Vitals: BP 134/78 (BP Location: Left Arm, Patient Position: Sitting, Cuff Size: Normal)   Pulse 63   Temp 98.2 F (36.8 C) (Oral)   Ht 5\' 3"  (1.6 m)   Wt 133 lb 12.8 oz (60.7 kg)   SpO2 96%   BMI 23.70 kg/m   Physical Exam Vitals signs reviewed.  Constitutional:      General: She is not in acute distress.    Appearance: Normal appearance. She is well-developed. She is not diaphoretic.  HENT:      Head: Normocephalic and atraumatic.     Right Ear: Tympanic membrane, ear canal and external ear normal.     Left Ear: Tympanic membrane, ear canal and external ear normal.     Nose: Nose normal.     Mouth/Throat:     Mouth: Mucous membranes are moist.     Pharynx: Oropharynx is clear. No oropharyngeal exudate.  Eyes:     General: No scleral icterus.    Conjunctiva/sclera: Conjunctivae normal.     Pupils: Pupils are equal, round, and reactive to light.  Neck:     Musculoskeletal: Neck supple.     Thyroid: No thyromegaly.  Cardiovascular:     Rate and Rhythm: Normal rate and regular rhythm.     Pulses: Normal pulses.     Heart sounds: Normal heart sounds. No murmur.  Pulmonary:     Effort: Pulmonary effort is normal. No respiratory distress.     Breath sounds:  Normal breath sounds. No wheezing or rales.  Abdominal:     General: Bowel sounds are normal. There is no distension.     Palpations: Abdomen is soft.     Tenderness: There is no abdominal tenderness. There is no guarding or rebound.  Musculoskeletal:        General: No deformity.     Right lower leg: No edema.     Left lower leg: No edema.  Lymphadenopathy:     Cervical: No cervical adenopathy.  Skin:    General: Skin is warm and dry.     Capillary Refill: Capillary refill takes less than 2 seconds.     Findings: No rash.  Neurological:     Mental Status: She is alert and oriented to person, place, and time. Mental status is at baseline.     Comments: +resting tremor of head and UEs  Psychiatric:        Mood and Affect: Mood normal.        Behavior: Behavior normal.        Thought Content: Thought content normal.     Diabetic Foot Exam - Simple   Simple Foot Form Diabetic Foot exam was performed with the following findings:  Yes 07/27/2018 10:21 AM  Visual Inspection No deformities, no ulcerations, no other skin breakdown bilaterally:  Yes Sensation Testing Intact to touch and monofilament testing  bilaterally:  Yes Pulse Check Posterior Tibialis and Dorsalis pulse intact bilaterally:  Yes Comments      Activities of Daily Living In your present state of health, do you have any difficulty performing the following activities: 06/21/2018  Hearing? Y  Comment Has bilateral hearing aids however does not wear them all the time.  Vision? N  Difficulty concentrating or making decisions? Y  Walking or climbing stairs? N  Dressing or bathing? N  Doing errands, shopping? N  Preparing Food and eating ? N  Using the Toilet? N  In the past six months, have you accidently leaked urine? N  Do you have problems with loss of bowel control? N  Managing your Medications? N  Managing your Finances? N  Housekeeping or managing your Housekeeping? N  Some recent data might be hidden    Fall Risk Assessment Fall Risk  06/21/2018 03/30/2018 06/11/2017 09/24/2016 06/06/2016  Falls in the past year? 1 Yes No No No  Number falls in past yr: 0 1 - - -  Injury with Fall? 0 No - - -  Follow up Falls prevention discussed - - - -     Depression Screen PHQ 2/9 Scores 06/21/2018 03/30/2018 06/11/2017 09/24/2016  PHQ - 2 Score 1 0 0 0  PHQ- 9 Score - 0 0 -    6CIT Screen 06/21/2018  What Year? 0 points  What month? 0 points  What time? 0 points  Count back from 20 0 points  Months in reverse 0 points  Repeat phrase 0 points  Total Score 0    Assessment & Plan:    Problem List Items Addressed This Visit      Endocrine   Diabetes mellitus ([place])    Reviewed most recent A1c Previously well controlled with low-carb diet and exercise Not currently on medications Recheck A1c Foot exam completed today Up-to-date on urine microalbumin and vaccines and eye exam Follow-up in 6 months      Relevant Orders   Comprehensive metabolic panel   Hemoglobin A1c   Postablative hypothyroidism    Well-controlled and asymptomatic Last TSH  reviewed Continue Synthroid at current dose Follow-up in 6 months  with recheck TSH        Musculoskeletal and Integument   Osteopenia    Continue vitamin D supplement and weightbearing exercise We will review DEXA when available next month        Other   Hypercholesterolemia    She is following a low-carb diet and exercising regularly It appears she has discussed statins with her previous PCP multiple times and she has declined them Recheck lipid panel and CMP today      Relevant Orders   Comprehensive metabolic panel   Lipid panel       Return in about 6 months (around 01/25/2019) for chronic disease f/u.   The entirety of the information documented in the History of Present Illness, Review of Systems and Physical Exam were personally obtained by me. Portions of this information were initially documented by [name], CMA and reviewed by me for thoroughness and accuracy.    Virginia Crews, MD, MPH [facility] 07/27/2018 11:04 AM

## 2018-07-27 NOTE — Assessment & Plan Note (Signed)
She is following a low-carb diet and exercising regularly It appears she has discussed statins with her previous PCP multiple times and she has declined them Recheck lipid panel and CMP today

## 2018-07-28 LAB — LIPID PANEL
Chol/HDL Ratio: 4.3 ratio (ref 0.0–4.4)
Cholesterol, Total: 287 mg/dL — ABNORMAL HIGH (ref 100–199)
HDL: 67 mg/dL (ref >39)
LDL Calculated: 193 mg/dL — ABNORMAL HIGH (ref 0–99)
Triglycerides: 137 mg/dL (ref 0–149)
VLDL Cholesterol Cal: 27 mg/dL (ref 5–40)

## 2018-07-28 LAB — COMPREHENSIVE METABOLIC PANEL
ALT: 10 IU/L (ref 0–32)
AST: 14 IU/L (ref 0–40)
Albumin/Globulin Ratio: 2 (ref 1.2–2.2)
Albumin: 4.6 g/dL (ref 3.5–4.7)
Alkaline Phosphatase: 70 IU/L (ref 39–117)
BUN/Creatinine Ratio: 16 (ref 12–28)
BUN: 14 mg/dL (ref 8–27)
Bilirubin Total: 0.7 mg/dL (ref 0.0–1.2)
CO2: 26 mmol/L (ref 20–29)
CREATININE: 0.89 mg/dL (ref 0.57–1.00)
Calcium: 9.4 mg/dL (ref 8.7–10.3)
Chloride: 98 mmol/L (ref 96–106)
GFR calc Af Amer: 67 mL/min/{1.73_m2} (ref >59)
GFR calc non Af Amer: 58 mL/min/{1.73_m2} — ABNORMAL LOW (ref >59)
GLOBULIN, TOTAL: 2.3 g/dL (ref 1.5–4.5)
Glucose: 106 mg/dL — ABNORMAL HIGH (ref 65–99)
Potassium: 3.9 mmol/L (ref 3.5–5.2)
Sodium: 140 mmol/L (ref 134–144)
Total Protein: 6.9 g/dL (ref 6.0–8.5)

## 2018-07-28 LAB — HEMOGLOBIN A1C
Est. average glucose Bld gHb Est-mCnc: 128 mg/dL
Hgb A1c MFr Bld: 6.1 % — ABNORMAL HIGH (ref 4.8–5.6)

## 2018-07-29 ENCOUNTER — Telehealth: Payer: Self-pay

## 2018-07-29 NOTE — Telephone Encounter (Signed)
Patient advised as directed below. 

## 2018-07-29 NOTE — Telephone Encounter (Signed)
-----   Message from Carmon Ginsberg, [place] sent at 07/29/2018 11:01 AM EST ----- Sugar is under control. She willl need to discuss further her elevated cholesterol with Dr. Jacinto Reap.

## 2018-08-17 DIAGNOSIS — L578 Other skin changes due to chronic exposure to nonionizing radiation: Secondary | ICD-10-CM | POA: Diagnosis not present

## 2018-08-17 DIAGNOSIS — L812 Freckles: Secondary | ICD-10-CM | POA: Diagnosis not present

## 2018-08-17 DIAGNOSIS — Z85828 Personal history of other malignant neoplasm of skin: Secondary | ICD-10-CM | POA: Diagnosis not present

## 2018-08-17 DIAGNOSIS — D239 Other benign neoplasm of skin, unspecified: Secondary | ICD-10-CM | POA: Diagnosis not present

## 2018-08-17 DIAGNOSIS — Z1283 Encounter for screening for malignant neoplasm of skin: Secondary | ICD-10-CM | POA: Diagnosis not present

## 2018-08-17 DIAGNOSIS — L821 Other seborrheic keratosis: Secondary | ICD-10-CM | POA: Diagnosis not present

## 2018-08-19 ENCOUNTER — Ambulatory Visit
Admission: RE | Admit: 2018-08-19 | Discharge: 2018-08-19 | Disposition: A | Discharge status: Home / Self Care | Payer: Medicare Other | Source: Ambulatory Visit | Attending: Family Medicine | Admitting: Family Medicine

## 2018-08-19 DIAGNOSIS — E2839 Other primary ovarian failure: Secondary | ICD-10-CM | POA: Insufficient documentation

## 2018-08-19 DIAGNOSIS — M8589 Other specified disorders of bone density and structure, multiple sites: Secondary | ICD-10-CM | POA: Diagnosis not present

## 2018-08-20 ENCOUNTER — Telehealth: Payer: Self-pay

## 2018-08-20 NOTE — Telephone Encounter (Signed)
LMTCB

## 2018-08-20 NOTE — Telephone Encounter (Signed)
Patient returning your call. KW

## 2018-08-20 NOTE — Telephone Encounter (Signed)
-----   Message from Virginia Crews, MD sent at 08/19/2018  4:38 PM EST ----- Bone density scan shows osteopenia (this is some bone loss, but not as bad as osteoporosis).  No previous DEXA available for comparison.  Recommend regular weight bearing exercise, avoiding smoking, and adequate Ca (1200mg /day) and Vit D (1000 units daily) via diet or supplement.  We will consider rechecking in 2 years to ensure this hasn't worsened.

## 2018-08-24 NOTE — Telephone Encounter (Signed)
Patient advised.

## 2018-08-27 DIAGNOSIS — H903 Sensorineural hearing loss, bilateral: Secondary | ICD-10-CM | POA: Diagnosis not present

## 2018-08-27 DIAGNOSIS — H6123 Impacted cerumen, bilateral: Secondary | ICD-10-CM | POA: Diagnosis not present

## 2018-08-31 ENCOUNTER — Ambulatory Visit (INDEPENDENT_AMBULATORY_CARE_PROVIDER_SITE_OTHER): Payer: Medicare Other | Admitting: Family Medicine

## 2018-08-31 ENCOUNTER — Encounter: Payer: Self-pay | Admitting: Family Medicine

## 2018-08-31 VITALS — BP 112/52 | HR 73 | Temp 98.6°F | Resp 16 | Wt 130.0 lb

## 2018-08-31 DIAGNOSIS — J40 Bronchitis, not specified as acute or chronic: Secondary | ICD-10-CM | POA: Diagnosis not present

## 2018-08-31 MED ORDER — AZITHROMYCIN 250 MG PO TABS: ORAL_TABLET | ORAL | 0 refills | Status: DC

## 2018-08-31 NOTE — Progress Notes (Signed)
Patient: Shannon Mckinney Female    DOB: 1928-08-09   85 y.o.   MRN: [id number] Visit Date: 08/31/2018  Today's Provider: Vernie Murders, PA   Chief Complaint  Patient presents with  . Cough   Subjective:     Cough  This is a new problem. The current episode started in the past 7 days. The problem has been gradually worsening. The cough is productive of sputum. Associated symptoms include headaches, nasal congestion, rhinorrhea and a sore throat. Pertinent negatives include no chest pain, chills, ear congestion, ear pain, fever, heartburn, hemoptysis, myalgias, postnasal drip, rash, shortness of breath, sweats, weight loss or wheezing.   Patient has had sore throat and cough for 1 week. Patient states cough is productive. Patient also has symptoms of headaches, runny nose, and sinus pressure. Patient states she has been taking some otc cough medications but does not know the name of them.   Past Medical History:  Diagnosis Date  . Allergy   . Arthritis   . Benign head tremor   . Diabetes mellitus ([place])    diet controlled  . Fibrocystic breast disease   . Heart murmur   . History of abnormal Pap smear   . Hypercholesterolemia   . Hyperthyroidism    s/p ablation   Past Surgical History:  Procedure Laterality Date  . BREAST BIOPSY  70's   benign  . CARPAL TUNNEL RELEASE     right hand  . KNEE ARTHROSCOPY Left 09/12/2015   Procedure: ARTHROSCOPY LEFT KNEE, PARTIAL MEDIAL MENISECTOMY, PARTIAL LATERAL MENISECTOMY, CHONDROPLASTY MEDIAL AND LATERAL;  Surgeon: Dereck Leep, MD;  Location: [facility] ORS;  Service: Orthopedics;  Laterality: Left;  . SHOULDER ARTHROSCOPY    . TONSILLECTOMY     Family History  Problem Relation Age of Onset  . Heart disease Father        myocardial infarction - died 35  . Diabetes Father   . Heart disease Mother        myocardial infarction  . Heart disease Brother        x2  . Diabetes Brother   . Diabetes Brother   . Diabetes Sister   .  Breast cancer Neg Hx   . Colon cancer Neg Hx    Allergies  Allergen Reactions  . Mysoline [Primidone] Other (See Comments)    "unknown"  . Penicillins Rash    Current Outpatient Medications:  .  Ascorbic Acid (VITAMIN C PO), Take by mouth., Disp: , Rfl:  .  cholecalciferol (VITAMIN D) 1000 units tablet, Take 1,000 Units by mouth daily. , Disp: , Rfl:  .  levothyroxine (SYNTHROID) 75 MCG tablet, Take 1 tablet (75 mcg total) by mouth daily before breakfast., Disp: 90 tablet, Rfl: 3  Review of Systems  Constitutional: Negative for appetite change, chills, fatigue, fever and weight loss.  HENT: Positive for congestion, rhinorrhea, sinus pressure, sinus pain and sore throat. Negative for ear pain and postnasal drip.   Respiratory: Positive for cough. Negative for hemoptysis, chest tightness, shortness of breath and wheezing.   Cardiovascular: Negative for chest pain and palpitations.  Gastrointestinal: Negative for abdominal pain, heartburn, nausea and vomiting.  Musculoskeletal: Negative for myalgias.  Skin: Negative for rash.  Neurological: Positive for headaches. Negative for dizziness and weakness.   Social History   Tobacco Use  . Smoking status: Never Smoker  . Smokeless tobacco: Never Used  Substance Use Topics  . Alcohol use: Yes    Alcohol/week: 0.0  standard drinks    Comment: occasional glass of wine     Objective:   BP (!) 112/52 (BP Location: Right Arm, Patient Position: Sitting, Cuff Size: Normal)   Pulse 73   Temp 98.6 F (37 C) (Oral)   Resp 16   Wt 130 lb (59 kg)   SpO2 93%   BMI 23.03 kg/m  Vitals:   08/31/18 0931  BP: (!) 112/52  Pulse: 73  Resp: 16  Temp: 98.6 F (37 C)  TempSrc: Oral  SpO2: 93%  Weight: 130 lb (59 kg)   Physical Exam Constitutional:      Appearance: Normal appearance.  HENT:     Head: Normocephalic and atraumatic.     Right Ear: Tympanic membrane normal.     Left Ear: Tympanic membrane normal.     Nose: Congestion and  rhinorrhea present.     Mouth/Throat:     Pharynx: Posterior oropharyngeal erythema present.     Comments: Red cobblestoning of posterior pharynx. Eyes:     Conjunctiva/sclera: Conjunctivae normal.  Cardiovascular:     Rate and Rhythm: Normal rate and regular rhythm.     Heart sounds: Normal heart sounds.  Pulmonary:     Breath sounds: Rhonchi present. No wheezing or rales.  Abdominal:     General: Bowel sounds are normal.     Palpations: Abdomen is soft.  Skin:    Findings: No rash.  Neurological:     Mental Status: She is alert and oriented to person, place, and time.       Assessment & Plan    1. Bronchitis Started having rhinorrhea with cough and sputum production 7-10 days ago. No fever but having sore throat from PND now. Rhonchi in chest today. Will treat with Mucinex-DM and add Z-pak. Increase fluid intake and recheck in a week if no better. - azithromycin (ZITHROMAX) 250 MG tablet; Take 2 tablets by mouth the first day then one daily for 4 days.  Dispense: 6 tablet; Refill: Berger, PA  [facility]

## 2018-09-08 ENCOUNTER — Ambulatory Visit (INDEPENDENT_AMBULATORY_CARE_PROVIDER_SITE_OTHER): Payer: Medicare Other | Admitting: Family Medicine

## 2018-09-08 ENCOUNTER — Encounter: Payer: Self-pay | Admitting: Family Medicine

## 2018-09-08 ENCOUNTER — Ambulatory Visit
Admission: RE | Admit: 2018-09-08 | Discharge: 2018-09-08 | Disposition: A | Discharge status: Home / Self Care | Payer: Medicare Other | Attending: Family Medicine | Admitting: Family Medicine

## 2018-09-08 ENCOUNTER — Ambulatory Visit
Admission: RE | Admit: 2018-09-08 | Discharge: 2018-09-08 | Disposition: A | Discharge status: Home / Self Care | Payer: Medicare Other | Source: Ambulatory Visit | Attending: Family Medicine | Admitting: Family Medicine

## 2018-09-08 VITALS — BP 133/74 | HR 68 | Temp 97.8°F | Wt 129.6 lb

## 2018-09-08 DIAGNOSIS — R918 Other nonspecific abnormal finding of lung field: Secondary | ICD-10-CM | POA: Diagnosis not present

## 2018-09-08 DIAGNOSIS — R05 Cough: Secondary | ICD-10-CM

## 2018-09-08 DIAGNOSIS — R059 Cough, unspecified: Secondary | ICD-10-CM

## 2018-09-08 MED ORDER — PREDNISONE 20 MG PO TABS: 40.0000 mg | ORAL_TABLET | Freq: Every day | ORAL | 0 refills | Status: AC

## 2018-09-08 NOTE — Patient Instructions (Signed)
Community-Acquired Pneumonia, Adult  Pneumonia is an infection of the lungs. It causes swelling in the airways of the lungs. Mucus and fluid may also build up inside the airways.  One type of pneumonia can happen while a person is in a hospital. A different type can happen when a person is not in a hospital (community-acquired pneumonia).   What are the causes?    This condition is caused by germs (viruses, bacteria, or fungi). Some types of germs can be passed from one person to another. This can happen when you breathe in droplets from the cough or sneeze of an infected person.  What increases the risk?  You are more likely to develop this condition if you:   Have a long-term (chronic) disease, such as:  ? Chronic obstructive pulmonary disease (COPD).  ? Asthma.  ? Cystic fibrosis.  ? Congestive heart failure.  ? Diabetes.  ? Kidney disease.   Have HIV.   Have sickle cell disease.   Have had your spleen removed.   Do not take good care of your teeth and mouth (poor dental hygiene).   Have a medical condition that increases the risk of breathing in droplets from your own mouth and nose.   Have a weakened body defense system (immune system).   Are a smoker.   Travel to areas where the germs that cause this illness are common.   Are around certain animals or the places they live.  What are the signs or symptoms?   A dry cough.   A wet (productive) cough.   Fever.   Sweating.   Chest pain. This often happens when breathing deeply or coughing.   Fast breathing or trouble breathing.   Shortness of breath.   Shaking chills.   Feeling tired (fatigue).   Muscle aches.  How is this treated?  Treatment for this condition depends on many things. Most adults can be treated at home. In some cases, treatment must happen in a hospital. Treatment may include:   Medicines given by mouth or through an IV tube.   Being given extra oxygen.   Respiratory therapy.  In rare cases, treatment for very bad pneumonia  may include:   Using a machine to help you breathe.   Having a procedure to remove fluid from around your lungs.  Follow these instructions at home:  Medicines   Take over-the-counter and prescription medicines only as told by your doctor.  ? Only take cough medicine if you are losing sleep.   If you were prescribed an antibiotic medicine, take it as told by your doctor. Do not stop taking the antibiotic even if you start to feel better.  General instructions     Sleep with your head and neck raised (elevated). You can do this by sleeping in a recliner or by putting a few pillows under your head.   Rest as needed. Get at least 8 hours of sleep each night.   Drink enough water to keep your pee (urine) pale yellow.   Eat a healthy diet that includes plenty of vegetables, fruits, whole grains, low-fat dairy products, and lean protein.   Do not use any products that contain nicotine or tobacco. These include cigarettes, e-cigarettes, and chewing tobacco. If you need help quitting, ask your doctor.   Keep all follow-up visits as told by your doctor. This is important.  How is this prevented?  A shot (vaccine) can help prevent pneumonia. Shots are often suggested for:   People   older than 83 years of age.   People older than 83 years of age who:  ? Are having cancer treatment.  ? Have long-term (chronic) lung disease.  ? Have problems with their body's defense system.  You may also prevent pneumonia if you take these actions:   Get the flu (influenza) shot every year.   Go to the dentist as often as told.   Wash your hands often. If you cannot use soap and water, use hand sanitizer.  Contact a doctor if:   You have a fever.   You lose sleep because your cough medicine does not help.  Get help right away if:   You are short of breath and it gets worse.   You have more chest pain.   Your sickness gets worse. This is very serious if:  ? You are an older adult.  ? Your body's defense system is weak.   You  cough up blood.  Summary   Pneumonia is an infection of the lungs.   Most adults can be treated at home. Some will need treatment in a hospital.   Drink enough water to keep your pee pale yellow.   Get at least 8 hours of sleep each night.  This information is not intended to replace advice given to you by your health care provider. Make sure you discuss any questions you have with your health care provider.  Document Released: 12/31/2007 Document Revised: 03/11/2018 Document Reviewed: 03/11/2018  Elsevier Interactive Patient Education  2019 [employer]

## 2018-09-08 NOTE — Progress Notes (Signed)
Patient: Shannon Mckinney Female    DOB: 05/05/1929   85 y.o.   MRN: [id number] Visit Date: 09/09/2018  Today's Provider: Lavon Paganini, MD   Chief Complaint  Patient presents with  . Bronchitis   Subjective:    I, Porsha McClurkin, CMA, am acting as a scribe for Lavon Paganini, MD.   HPI Bronchitis:  Patient presents today for a follow up. Last OV was on 08/31/2018. She was prescribed Z-pak. Patient states she finished the medications, but she is still coughing, congestion and feels fatigued.   Allergies  Allergen Reactions  . Mysoline [Primidone] Other (See Comments)    "unknown"  . Penicillins Rash     Current Outpatient Medications:  .  Ascorbic Acid (VITAMIN C PO), Take by mouth., Disp: , Rfl:  .  cholecalciferol (VITAMIN D) 1000 units tablet, Take 1,000 Units by mouth daily. , Disp: , Rfl:  .  levothyroxine (SYNTHROID) 75 MCG tablet, Take 1 tablet (75 mcg total) by mouth daily before breakfast., Disp: 90 tablet, Rfl: 3 .  azithromycin (ZITHROMAX) 250 MG tablet, Take 2 tablets by mouth the first day then one daily for 4 days. (Patient not taking: Reported on 09/08/2018), Disp: 6 tablet, Rfl: 0 .  predniSONE (DELTASONE) 20 MG tablet, Take 2 tablets (40 mg total) by mouth daily with breakfast for 7 days., Disp: 14 tablet, Rfl: 0  Review of Systems  Constitutional: Positive for fatigue.  Respiratory: Positive for cough.   Cardiovascular: Negative.   Musculoskeletal: Negative.     Social History   Tobacco Use  . Smoking status: Never Smoker  . Smokeless tobacco: Never Used  Substance Use Topics  . Alcohol use: Yes    Alcohol/week: 0.0 standard drinks    Comment: occasional glass of wine      Objective:   BP 133/74 (BP Location: Left Arm, Patient Position: Sitting, Cuff Size: Normal)   Pulse 68   Temp 97.8 F (36.6 C) (Oral)   Wt 129 lb 9.6 oz (58.8 kg)   BMI 22.96 kg/m  Vitals:   09/08/18 1353  BP: 133/74  Pulse: 68  Temp: 97.8 F (36.6 C)  TempSrc:  Oral  Weight: 129 lb 9.6 oz (58.8 kg)     Physical Exam Vitals signs reviewed.  Constitutional:      General: She is not in acute distress.    Appearance: Normal appearance. She is not diaphoretic.  HENT:     Head: Normocephalic and atraumatic.     Right Ear: Tympanic membrane, ear canal and external ear normal.     Left Ear: Tympanic membrane, ear canal and external ear normal.     Nose: Congestion present.     Mouth/Throat:     Mouth: Mucous membranes are moist.     Pharynx: Oropharynx is clear. No posterior oropharyngeal erythema.  Eyes:     General: No scleral icterus.    Conjunctiva/sclera: Conjunctivae normal.     Pupils: Pupils are equal, round, and reactive to light.  Neck:     Musculoskeletal: Neck supple.  Cardiovascular:     Rate and Rhythm: Normal rate and regular rhythm.     Pulses: Normal pulses.     Heart sounds: Normal heart sounds. No murmur.  Pulmonary:     Effort: Pulmonary effort is normal. No respiratory distress.     Breath sounds: Rhonchi (R>L) present. No wheezing.  Abdominal:     General: There is no distension.     Palpations: Abdomen is  soft.     Tenderness: There is no abdominal tenderness.  Musculoskeletal:     Right lower leg: No edema.     Left lower leg: No edema.  Lymphadenopathy:     Cervical: No cervical adenopathy.  Skin:    General: Skin is warm and dry.     Capillary Refill: Capillary refill takes less than 2 seconds.     Findings: No rash.  Neurological:     Mental Status: She is alert and oriented to person, place, and time. Mental status is at baseline.  Psychiatric:        Mood and Affect: Mood normal.        Behavior: Behavior normal.     Dg Chest 2 View  Result Date: 09/09/2018 CLINICAL DATA:  Cough, weakness and congestion for 2 weeks. EXAM: CHEST - 2 VIEW COMPARISON:  02/13/2017 FINDINGS: Cardiac silhouette is normal in size. Right aortic arch. No mediastinal or hilar masses. No evidence of adenopathy. There are  prominent bronchovascular markings similar to the prior exam. Lungs are otherwise clear. No pleural effusion or pneumothorax. Skeletal structures are intact. IMPRESSION: No active cardiopulmonary disease. Electronically Signed   By: Lajean Manes M.D.   On: 09/09/2018 08:22      Assessment & Plan   1. Cough 2. Lung field abnormal finding on examination - CXR without evidence of pneumonia - symptoms and exam c/w bronchitis - most likely viral  - no evidence of strep pharyngitis, CAP, AOM, bacterial sinusitis, or other bacterial infection - will treat with prednisone burst - discussed symptomatic management, natural course, and return precautions  - DG Chest 2 View; Future   Return if symptoms worsen or fail to improve.   The entirety of the information documented in the History of Present Illness, Review of Systems and Physical Exam were personally obtained by me. Portions of this information were initially documented by [facility], CMA and reviewed by me for thoroughness and accuracy.    Virginia Crews, MD, MPH [facility] 09/09/2018 1:16 PM

## 2019-01-04 ENCOUNTER — Other Ambulatory Visit: Payer: Self-pay | Admitting: Family Medicine

## 2019-01-24 NOTE — Progress Notes (Signed)
Patient: Shannon Mckinney Female    DOB: 1929-01-25   85 y.o.   MRN: [id number] Visit Date: 01/27/2019  Today's Provider: Lavon Paganini, MD   Chief Complaint  Patient presents with  . Diabetes  . Hyperlipidemia  . Hypothyroidism   Subjective:    Virtual Visit via Telephone Note  I connected with Shannon Mckinney on 01/27/19 at 10:40 AM EDT by telephone and verified that I am speaking with the correct person using two identifiers.   Patient location: home Provider location: [place] involved in the visit: patient, provider   I discussed the limitations, risks, security and privacy concerns of performing an evaluation and management service by telephone and the availability of in person appointments. I also discussed with the patient that there may be a patient responsible charge related to this service. The patient expressed understanding and agreed to proceed.  HPI T2DM - 6 month follow up - Checking BG at home: No - Medications: None - Compliance: N/A - Diet: Low-carb - eye exam: 04/01/2018 - foot exam:07/27/2018 - microalbumin:03/30/2018 - denies symptoms of hypoglycemia, polyuria, polydipsia, numbness extremities, foot ulcers/trauma   HLD - medications: None - compliance: N/A - diet controlled   Hypothyroidism  -6 month follow  -medications: Levothyroxine 75 mcg  -compliance:good   Allergies  Allergen Reactions  . Mysoline [Primidone] Other (See Comments)    "unknown"  . Penicillins Rash     Current Outpatient Medications:  .  Apoaequorin (PREVAGEN PO), Take by mouth., Disp: , Rfl:  .  Ascorbic Acid (VITAMIN C PO), Take by mouth., Disp: , Rfl:  .  cholecalciferol (VITAMIN D) 1000 units tablet, Take 1,000 Units by mouth daily. , Disp: , Rfl:  .  levothyroxine (SYNTHROID) 75 MCG tablet, TAKE 1 TABLET BY MOUTH DAILY BEFORE BREAKFAST, Disp: 90 tablet, Rfl: 3  Review of Systems  Constitutional: Negative.   Respiratory: Negative.    Cardiovascular: Negative.   Musculoskeletal: Negative.     Social History   Tobacco Use  . Smoking status: Never Smoker  . Smokeless tobacco: Never Used  Substance Use Topics  . Alcohol use: Yes    Alcohol/week: 0.0 standard drinks    Comment: occasional glass of wine      Objective:   There were no vitals taken for this visit. There were no vitals filed for this visit.   Physical Exam   No results found for any visits on 01/25/19.     Assessment & Plan    I discussed the assessment and treatment plan with the patient. The patient was provided an opportunity to ask questions and all were answered. The patient agreed with the plan and demonstrated an understanding of the instructions.   The patient was advised to call back or seek an in-person evaluation if the symptoms worsen or if the condition fails to improve as anticipated.  Problem List Items Addressed This Visit      Endocrine   Diabetes mellitus ([place])    Reviewed most recent A1c Previously well controlled with low carb diet Not on any medications She is up-to-date on her screenings and vaccinations Given the patient's advanced age, she is hesitant to come out for labs during the pandemic, which I understand We will plan to recheck her A1c and other labs at her next in-person visit      Postablative hypothyroidism    Previously well controlled and asymptomatic Last TSH reviewed Continue Synthroid at current dose Plan to recheck  TSH at next in-person visit        Other   Hypercholesterolemia    Statins have been discussed with the patient multiple times by previous PCP and by myself and she is declined them Reviewed last lipid panel Plan to recheck lipid panel at next in-person visit Encourage lifestyle interventions, such as diet and exercise          Return in about 6 months (around 07/27/2019) for CPE/AWV as scheduled.   The entirety of the information documented in the History of Present  Illness, Review of Systems and Physical Exam were personally obtained by me. Portions of this information were initially documented by [name], CMA and reviewed by me for thoroughness and accuracy.    , Dionne Bucy, MD MPH [facility]

## 2019-01-25 ENCOUNTER — Encounter: Payer: Self-pay | Admitting: Family Medicine

## 2019-01-25 ENCOUNTER — Ambulatory Visit (INDEPENDENT_AMBULATORY_CARE_PROVIDER_SITE_OTHER): Payer: Medicare Other | Admitting: Family Medicine

## 2019-01-25 DIAGNOSIS — E7800 Pure hypercholesterolemia, unspecified: Secondary | ICD-10-CM | POA: Diagnosis not present

## 2019-01-25 DIAGNOSIS — E119 Type 2 diabetes mellitus without complications: Secondary | ICD-10-CM

## 2019-01-25 DIAGNOSIS — E890 Postprocedural hypothyroidism: Secondary | ICD-10-CM | POA: Diagnosis not present

## 2019-01-25 NOTE — Assessment & Plan Note (Addendum)
Reviewed most recent A1c Previously well controlled with low carb diet Not on any medications She is up-to-date on her screenings and vaccinations Given the patient's advanced age, she is hesitant to come out for labs during the pandemic, which I understand We will plan to recheck her A1c and other labs at her next in-person visit

## 2019-01-27 NOTE — Assessment & Plan Note (Signed)
Previously well controlled and asymptomatic Last TSH reviewed Continue Synthroid at current dose Plan to recheck TSH at next in-person visit

## 2019-01-27 NOTE — Assessment & Plan Note (Signed)
Statins have been discussed with the patient multiple times by previous PCP and by myself and she is declined them Reviewed last lipid panel Plan to recheck lipid panel at next in-person visit Encourage lifestyle interventions, such as diet and exercise

## 2019-02-01 DIAGNOSIS — I8393 Asymptomatic varicose veins of bilateral lower extremities: Secondary | ICD-10-CM | POA: Diagnosis not present

## 2019-02-01 DIAGNOSIS — Z85828 Personal history of other malignant neoplasm of skin: Secondary | ICD-10-CM | POA: Diagnosis not present

## 2019-02-01 DIAGNOSIS — B353 Tinea pedis: Secondary | ICD-10-CM | POA: Diagnosis not present

## 2019-02-01 DIAGNOSIS — L821 Other seborrheic keratosis: Secondary | ICD-10-CM | POA: Diagnosis not present

## 2019-02-01 DIAGNOSIS — L570 Actinic keratosis: Secondary | ICD-10-CM | POA: Diagnosis not present

## 2019-02-01 DIAGNOSIS — D1800 Hemangioma unspecified site: Secondary | ICD-10-CM | POA: Diagnosis not present

## 2019-02-01 DIAGNOSIS — D225 Melanocytic nevi of trunk: Secondary | ICD-10-CM | POA: Diagnosis not present

## 2019-02-01 DIAGNOSIS — B351 Tinea unguium: Secondary | ICD-10-CM | POA: Diagnosis not present

## 2019-02-01 DIAGNOSIS — Z1283 Encounter for screening for malignant neoplasm of skin: Secondary | ICD-10-CM | POA: Diagnosis not present

## 2019-04-11 DIAGNOSIS — S80861A Insect bite (nonvenomous), right lower leg, initial encounter: Secondary | ICD-10-CM | POA: Diagnosis not present

## 2019-04-11 DIAGNOSIS — S70361A Insect bite (nonvenomous), right thigh, initial encounter: Secondary | ICD-10-CM | POA: Diagnosis not present

## 2019-04-11 DIAGNOSIS — S80862A Insect bite (nonvenomous), left lower leg, initial encounter: Secondary | ICD-10-CM | POA: Diagnosis not present

## 2019-04-11 DIAGNOSIS — S70362A Insect bite (nonvenomous), left thigh, initial encounter: Secondary | ICD-10-CM | POA: Diagnosis not present

## 2019-05-25 DIAGNOSIS — Z23 Encounter for immunization: Secondary | ICD-10-CM | POA: Diagnosis not present

## 2019-06-21 NOTE — Progress Notes (Signed)
Subjective:   Shannon Mckinney is a 85 y.o. female who presents for Medicare Annual (Subsequent) preventive examination.    This visit is being conducted through telemedicine due to the COVID-19 pandemic. This patient has given me verbal consent via doximity to conduct this visit, patient states they are participating from their home address. Some vital signs may be absent or patient reported.    Patient identification: identified by name, DOB, and current address  Review of Systems:  N/A  Cardiac Risk Factors include: advanced age (>51men, >34 women)     Objective:     Vitals: There were no vitals taken for this visit.  There is no height or weight on file to calculate BMI. Unable to obtain vitals due to visit being conducted via telephonically.   Advanced Directives 06/27/2019 06/21/2018 06/11/2017 06/06/2016 09/12/2015 09/04/2015  Does Patient Have a Medical Advance Directive? Yes Yes Yes Yes Yes Yes  Type of Paramedic of [place];Living will Living will;Healthcare Power of Attorney Living will [place];Living will [place];Living will [place];Living will  Does patient want to make changes to medical advance directive? - - No - Patient declined - No - Patient declined No - Patient declined  Copy of [place] in Chart? No - copy requested No - copy requested - No - copy requested No - copy requested No - copy requested    Tobacco Social History   Tobacco Use  Smoking Status Never Smoker  Smokeless Tobacco Never Used     Counseling given: Not Answered   Clinical Intake:  Pre-visit preparation completed: Yes  Pain : No/denies pain Pain Score: 0-No pain     Nutritional Risks: None Diabetes: Yes  How often do you need to have someone help you when you read instructions, pamphlets, or other written materials from your doctor or pharmacy?: 1 - Never   Diabetes:  Is  the patient diabetic?  Yes type 2 If diabetic, was a CBG obtained today?  No  Did the patient bring in their glucometer from home?  No  How often do you monitor your CBG's? Does not check.   Financial Strains and Diabetes Management:  Are you having any financial strains with the device, your supplies or your medication? No .  Does the patient want to be seen by Chronic Care Management for management of their diabetes?  No  Would the patient like to be referred to a Nutritionist or for Diabetic Management?  No   Diabetic Exams:  Diabetic Eye Exam: Completed 04/01/18. Overdue for diabetic eye exam. Pt has been advised about the importance in completing this exam. Next eye exam scheduled 07/2019.  Diabetic Foot Exam: Completed 08/19/18. Repeat yearly.   Interpreter Needed?: No  Information entered by :: [facility], LPN  Past Medical History:  Diagnosis Date  . Allergy   . Arthritis   . Benign head tremor   . Diabetes mellitus ([place])    diet controlled  . Fibrocystic breast disease   . Heart murmur   . History of abnormal Pap smear   . Hypercholesterolemia   . Hyperthyroidism    s/p ablation   Past Surgical History:  Procedure Laterality Date  . BREAST BIOPSY  70's   benign  . CARPAL TUNNEL RELEASE     right hand  . KNEE ARTHROSCOPY Left 09/12/2015   Procedure: ARTHROSCOPY LEFT KNEE, PARTIAL MEDIAL MENISECTOMY, PARTIAL LATERAL MENISECTOMY, CHONDROPLASTY MEDIAL AND LATERAL;  Surgeon: Dereck Leep, MD;  Location: [facility] ORS;  Service: Orthopedics;  Laterality: Left;  . SHOULDER ARTHROSCOPY    . TONSILLECTOMY     Family History  Problem Relation Age of Onset  . Heart disease Father        myocardial infarction - died 29  . Diabetes Father   . Heart disease Mother        myocardial infarction  . Heart disease Brother        x2  . Diabetes Brother   . Diabetes Brother   . Diabetes Sister   . Breast cancer Neg Hx   . Colon cancer Neg Hx    Social History    Socioeconomic History  . Marital status: Married    Spouse name: Not on file  . Number of children: 2  . Years of education: Not on file  . Highest education level: Bachelor's degree (e.g., BA, AB, BS)  Occupational History  . Occupation: retired Arboriculturist   Social Needs  . Financial resource strain: Not hard at all  . Food insecurity    Worry: Never true    Inability: Never true  . Transportation needs    Medical: No    Non-medical: No  Tobacco Use  . Smoking status: Never Smoker  . Smokeless tobacco: Never Used  Substance and Sexual Activity  . Alcohol use: Yes    Alcohol/week: 0.0 standard drinks    Comment: rare glass of wine  . Drug use: No  . Sexual activity: Not Currently  Lifestyle  . Physical activity    Days per week: 0 days    Minutes per session: 0 min  . Stress: Rather much  Relationships  . Social Herbalist on phone: Patient refused    Gets together: Patient refused    Attends religious service: Patient refused    Active member of club or organization: Patient refused    Attends meetings of clubs or organizations: Patient refused    Relationship status: Patient refused  Other Topics Concern  . Not on file  Social History Narrative  . Not on file    Outpatient Encounter Medications as of 06/27/2019  Medication Sig  . Apoaequorin (PREVAGEN PO) Take by mouth daily.   . Ascorbic Acid (VITAMIN C PO) Take by mouth daily. When remembers  . levothyroxine (SYNTHROID) 75 MCG tablet TAKE 1 TABLET BY MOUTH DAILY BEFORE BREAKFAST  . cholecalciferol (VITAMIN D) 1000 units tablet Take 1,000 Units by mouth daily.    No facility-administered encounter medications on file as of 06/27/2019.     Activities of Daily Living In your present state of health, do you have any difficulty performing the following activities: 06/27/2019  Hearing? N  Vision? N  Difficulty concentrating or making decisions? Y  Walking or climbing stairs? N  Dressing or  bathing? N  Doing errands, shopping? N  Preparing Food and eating ? N  Using the Toilet? N  In the past six months, have you accidently leaked urine? N  Do you have problems with loss of bowel control? N  Managing your Medications? N  Managing your Finances? N  Housekeeping or managing your Housekeeping? N  Some recent data might be hidden    Patient Care Team: Virginia Crews, MD as PCP - General (Family Medicine) Leandrew Koyanagi, MD as Referring Physician (Ophthalmology) Brendolyn Patty, MD (Dermatology)    Assessment:   This is a routine wellness examination for Adlai.  Exercise Activities  and Dietary recommendations Current Exercise Habits: The patient does not participate in regular exercise at present, Exercise limited by: None identified  Goals    . DIET - INCREASE WATER INTAKE     Recommend to drink at least 6-8 8oz glasses of water per day.     Marland Kitchen Healthy Lifestyle     Maintain drinking plenty of fluids, exercise regimen and low carb foods.       Fall Risk: Fall Risk  06/27/2019 01/25/2019 06/21/2018 03/30/2018 06/11/2017  Falls in the past year? 0 0 1 Yes No  Number falls in past yr: 0 - 0 1 -  Injury with Fall? 0 - 0 No -  Follow up - - Falls prevention discussed - -    FALL RISK PREVENTION PERTAINING TO THE HOME:  Any stairs in or around the home? Yes  If so, are there any without handrails? No   Home free of loose throw rugs in walkways, pet beds, electrical cords, etc? Yes  Adequate lighting in your home to reduce risk of falls? Yes   ASSISTIVE DEVICES UTILIZED TO PREVENT FALLS:  Life alert? Yes  Use of a cane, walker or w/c? No  Grab bars in the bathroom? Yes  Shower chair or bench in shower? Yes  Elevated toilet seat or a handicapped toilet? Yes   TIMED UP AND GO:  Was the test performed? No .    Depression Screen PHQ 2/9 Scores 06/27/2019 06/21/2018 03/30/2018 06/11/2017  PHQ - 2 Score 1 1 0 0  PHQ- 9 Score - - 0 0     Cognitive  Function MMSE - Mini Mental State Exam 06/11/2017 06/06/2016  Orientation to time 5 5  Orientation to Place 5 5  Registration 3 3  Attention/ Calculation 5 5  Recall 1 3  Language- name 2 objects 2 2  Language- repeat 1 1  Language- follow 3 step command 3 3  Language- read & follow direction 1 1  Write a sentence 1 1  Copy design 1 1  Total score 28 30     6CIT Screen 06/27/2019 06/21/2018 06/06/2016  What Year? 0 points 0 points 0 points  What month? 0 points 0 points 0 points  What time? 0 points 0 points 0 points  Count back from 20 0 points 0 points 0 points  Months in reverse 0 points 0 points 0 points  Repeat phrase 0 points 0 points 0 points  Total Score 0 0 0    Immunization History  Administered Date(s) Administered  . Influenza Split 04/27/2012, 05/16/2013, 05/11/2014  . Influenza, High Dose Seasonal PF 03/30/2018  . Influenza,inj,Quad PF,6+ Mos 06/11/2017  . Influenza-Unspecified 04/24/2014, 06/04/2015, 06/24/2016, 05/25/2019  . Pneumococcal Conjugate-13 07/04/2013  . Pneumococcal Polysaccharide-23 09/12/2016    Qualifies for Shingles Vaccine? Yes . Due for Shingrix. Pt has been advised to call insurance company to determine out of pocket expense. Advised may also receive vaccine at local pharmacy or Health Dept. Verbalized acceptance and understanding.  Tdap: Although this vaccine is not a covered service during a Wellness Exam, does the patient still wish to receive this vaccine today?  No .   Flu Vaccine: Up to date  Pneumococcal Vaccine: Completed series  Screening Tests Health Maintenance  Topic Date Due  . TETANUS/TDAP  11/29/1947  . HEMOGLOBIN A1C  01/25/2019  . URINE MICROALBUMIN  03/31/2019  . OPHTHALMOLOGY EXAM  04/02/2019  . FOOT EXAM  07/28/2019  . DEXA SCAN  08/20/2023  . INFLUENZA  VACCINE  Completed  . PNA vac Low Risk Adult  Completed  . MAMMOGRAM  Discontinued    Cancer Screenings:  Colorectal Screening: No longer required.    Mammogram: No longer required.   Bone Density: Completed 08/19/18. Results reflect OSTEOPENIA. Repeat every 5 years.   Lung Cancer Screening: (Low Dose CT Chest recommended if Age 52-80 years, 30 pack-year currently smoking OR have quit w/in 15years.) does not qualify.   Additional Screening:  Dental Screening: Recommended annual dental exams for proper oral hygiene   Community Resource Referral:  CRR required this visit?  No       Plan:  I have personally reviewed and addressed the Medicare Annual Wellness questionnaire and have noted the following in the patient's chart:  A. Medical and social history B. Use of alcohol, tobacco or illicit drugs  C. Current medications and supplements D. Functional ability and status E.  Nutritional status F.  Physical activity G. Advance directives H. List of other physicians I.  Hospitalizations, surgeries, and ER visits in previous 12 months J.  [place] such as hearing and vision if needed, cognitive and depression L. Referrals and appointments   In addition, I have reviewed and discussed with patient certain preventive protocols, quality metrics, and best practice recommendations. A written personalized care plan for preventive services as well as general preventive health recommendations were provided to patient.   [place]ora Score, [place]  13/14/3888 Nurse Health Advisor   Nurse Notes: Pt needs a urine check and Hgb A1c checked at next in office apt. Pt has an eye exam scheduled for 07/2019.

## 2019-06-27 ENCOUNTER — Ambulatory Visit (INDEPENDENT_AMBULATORY_CARE_PROVIDER_SITE_OTHER): Payer: Medicare Other

## 2019-06-27 ENCOUNTER — Other Ambulatory Visit: Payer: Self-pay

## 2019-06-27 DIAGNOSIS — Z0000 Encounter for general adult medical examination without abnormal findings: Secondary | ICD-10-CM

## 2019-06-27 NOTE — Patient Instructions (Signed)
[name] , Thank you for taking time to come for your Medicare Wellness Visit. I appreciate your ongoing commitment to your health goals. Please review the following plan we discussed and let me know if I can assist you in the future.   Screening recommendations/referrals: Colonoscopy: No longer required.  Mammogram: No longer required.  Bone Density: Up to date, due 07/2023 Recommended yearly ophthalmology/optometry visit for glaucoma screening and checkup Recommended yearly dental visit for hygiene and checkup  Vaccinations: Influenza vaccine: Up to date Pneumococcal vaccine: Completed series Tdap vaccine: Pt declines today.  Shingles vaccine: Pt declines today.     Advanced directives: Please bring a copy of your POA (Power of Attorney) and/or Living Will to your next appointment.   Conditions/risks identified: Recommend to increase water intake to 6-8 8 oz glasses daily.   Next appointment: 08/02/18 @ 2:00 PM with Dr Brita Romp   Preventive Care 65 Years and Older, Female Preventive care refers to lifestyle choices and visits with your health care provider that can promote health and wellness. What does preventive care include?  A yearly physical exam. This is also called an annual well check.  Dental exams once or twice a year.  Routine eye exams. Ask your health care provider how often you should have your eyes checked.  Personal lifestyle choices, including:  Daily care of your teeth and gums.  Regular physical activity.  Eating a healthy diet.  Avoiding tobacco and drug use.  Limiting alcohol use.  Practicing safe sex.  Taking low-dose aspirin every day.  Taking vitamin and mineral supplements as recommended by your health care provider. What happens during an annual well check? The services and screenings done by your health care provider during your annual well check will depend on your age, overall health, lifestyle risk factors, and family history of  disease. Counseling  Your health care provider may ask you questions about your:  Alcohol use.  Tobacco use.  Drug use.  Emotional well-being.  Home and relationship well-being.  Sexual activity.  Eating habits.  History of falls.  Memory and ability to understand (cognition).  Work and work Statistician.  Reproductive health. Screening  You may have the following tests or measurements:  Height, weight, and BMI.  Blood pressure.  Lipid and cholesterol levels. These may be checked every 5 years, or more frequently if you are over 56 years old.  Skin check.  Lung cancer screening. You may have this screening every year starting at age 62 if you have a 30-pack-year history of smoking and currently smoke or have quit within the past 15 years.  Fecal occult blood test (FOBT) of the stool. You may have this test every year starting at age 35.  Flexible sigmoidoscopy or colonoscopy. You may have a sigmoidoscopy every 5 years or a colonoscopy every 10 years starting at age 64.  Hepatitis C blood test.  Hepatitis B blood test.  Sexually transmitted disease (STD) testing.  Diabetes screening. This is done by checking your blood sugar (glucose) after you have not eaten for a while (fasting). You may have this done every 1-3 years.  Bone density scan. This is done to screen for osteoporosis. You may have this done starting at age 26.  Mammogram. This may be done every 1-2 years. Talk to your health care provider about how often you should have regular mammograms. Talk with your health care provider about your test results, treatment options, and if necessary, the need for more tests. Vaccines  Your health care provider may recommend certain vaccines, such as:  Influenza vaccine. This is recommended every year.  Tetanus, diphtheria, and acellular pertussis (Tdap, Td) vaccine. You may need a Td booster every 10 years.  Zoster vaccine. You may need this after age 33.   Pneumococcal 13-valent conjugate (PCV13) vaccine. One dose is recommended after age 23.  Pneumococcal polysaccharide (PPSV23) vaccine. One dose is recommended after age 48. Talk to your health care provider about which screenings and vaccines you need and how often you need them. This information is not intended to replace advice given to you by your health care provider. Make sure you discuss any questions you have with your health care provider. Document Released: 08/10/2015 Document Revised: 04/02/2016 Document Reviewed: 05/15/2015 Elsevier Interactive Patient Education  2017 [place] Prevention in the Home Falls can cause injuries. They can happen to people of all ages. There are many things you can do to make your home safe and to help prevent falls. What can I do on the outside of my home?  Regularly fix the edges of walkways and driveways and fix any cracks.  Remove anything that might make you trip as you walk through a door, such as a raised step or threshold.  Trim any bushes or trees on the path to your home.  Use bright outdoor lighting.  Clear any walking paths of anything that might make someone trip, such as rocks or tools.  Regularly check to see if handrails are loose or broken. Make sure that both sides of any steps have handrails.  Any raised decks and porches should have guardrails on the edges.  Have any leaves, snow, or ice cleared regularly.  Use sand or salt on walking paths during winter.  Clean up any spills in your garage right away. This includes oil or grease spills. What can I do in the bathroom?  Use night lights.  Install grab bars by the toilet and in the tub and shower. Do not use towel bars as grab bars.  Use non-skid mats or decals in the tub or shower.  If you need to sit down in the shower, use a plastic, non-slip stool.  Keep the floor dry. Clean up any water that spills on the floor as soon as it happens.  Remove soap  buildup in the tub or shower regularly.  Attach bath mats securely with double-sided non-slip rug tape.  Do not have throw rugs and other things on the floor that can make you trip. What can I do in the bedroom?  Use night lights.  Make sure that you have a light by your bed that is easy to reach.  Do not use any sheets or blankets that are too big for your bed. They should not hang down onto the floor.  Have a firm chair that has side arms. You can use this for support while you get dressed.  Do not have throw rugs and other things on the floor that can make you trip. What can I do in the kitchen?  Clean up any spills right away.  Avoid walking on wet floors.  Keep items that you use a lot in easy-to-reach places.  If you need to reach something above you, use a strong step stool that has a grab bar.  Keep electrical cords out of the way.  Do not use floor polish or wax that makes floors slippery. If you must use wax, use non-skid floor wax.  Do  not have throw rugs and other things on the floor that can make you trip. What can I do with my stairs?  Do not leave any items on the stairs.  Make sure that there are handrails on both sides of the stairs and use them. Fix handrails that are broken or loose. Make sure that handrails are as long as the stairways.  Check any carpeting to make sure that it is firmly attached to the stairs. Fix any carpet that is loose or worn.  Avoid having throw rugs at the top or bottom of the stairs. If you do have throw rugs, attach them to the floor with carpet tape.  Make sure that you have a light switch at the top of the stairs and the bottom of the stairs. If you do not have them, ask someone to add them for you. What else can I do to help prevent falls?  Wear shoes that:  Do not have high heels.  Have rubber bottoms.  Are comfortable and fit you well.  Are closed at the toe. Do not wear sandals.  If you use a stepladder:  Make  sure that it is fully opened. Do not climb a closed stepladder.  Make sure that both sides of the stepladder are locked into place.  Ask someone to hold it for you, if possible.  Clearly mark and make sure that you can see:  Any grab bars or handrails.  First and last steps.  Where the edge of each step is.  Use tools that help you move around (mobility aids) if they are needed. These include:  Canes.  Walkers.  Scooters.  Crutches.  Turn on the lights when you go into a dark area. Replace any light bulbs as soon as they burn out.  Set up your furniture so you have a clear path. Avoid moving your furniture around.  If any of your floors are uneven, fix them.  If there are any pets around you, be aware of where they are.  Review your medicines with your doctor. Some medicines can make you feel dizzy. This can increase your chance of falling. Ask your doctor what other things that you can do to help prevent falls. This information is not intended to replace advice given to you by your health care provider. Make sure you discuss any questions you have with your health care provider. Document Released: 05/10/2009 Document Revised: 12/20/2015 Document Reviewed: 08/18/2014 Elsevier Interactive Patient Education  2017 [employer].

## 2019-08-03 ENCOUNTER — Ambulatory Visit (INDEPENDENT_AMBULATORY_CARE_PROVIDER_SITE_OTHER): Payer: Medicare Other | Admitting: Family Medicine

## 2019-08-03 ENCOUNTER — Encounter: Payer: Self-pay | Admitting: Family Medicine

## 2019-08-03 DIAGNOSIS — K529 Noninfective gastroenteritis and colitis, unspecified: Secondary | ICD-10-CM | POA: Diagnosis not present

## 2019-08-03 DIAGNOSIS — E119 Type 2 diabetes mellitus without complications: Secondary | ICD-10-CM | POA: Diagnosis not present

## 2019-08-03 DIAGNOSIS — E890 Postprocedural hypothyroidism: Secondary | ICD-10-CM | POA: Diagnosis not present

## 2019-08-03 DIAGNOSIS — Z7189 Other specified counseling: Secondary | ICD-10-CM

## 2019-08-03 DIAGNOSIS — E559 Vitamin D deficiency, unspecified: Secondary | ICD-10-CM

## 2019-08-03 DIAGNOSIS — E7800 Pure hypercholesterolemia, unspecified: Secondary | ICD-10-CM

## 2019-08-03 NOTE — Progress Notes (Signed)
Patient: Shannon Mckinney, Female    DOB: 1929-01-03, 86 y.o.   MRN: [id number] Visit Date: 08/04/2019  Today's Provider: Lavon Paganini, MD   Chief Complaint  Patient presents with  . Abdominal Pain  . Diabetes  . Hypothyroidism  . Hyperlipidemia   Subjective:    Virtual Visit via Telephone Note  I connected with Shannon Mckinney on 08/04/19 at  2:00 PM EST by telephone and verified that I am speaking with the correct person using two identifiers.  Location:  Patient location: home Provider location: [facility] Persons involved in the visit: patient, provider   I discussed the limitations, risks, security and privacy concerns of performing an evaluation and management service by telephone and the availability of in person appointments. I also discussed with the patient that there may be a patient responsible charge related to this service. The patient expressed understanding and agreed to proceed.   F/u to [facility]V Shannon Mckinney is a 86 y.o. female. She feels fairly well. She reports exercising no. She reports she is sleeping well. 06/27/2019 [facility]V with Shannon Mckinney 08/10/2013 Mammogram-BI-RADS 1 08/19/2018 BMD-Osteopenia  ----------------------------------------------------------- Patient C/O abdominal cramping on and off for the past few days, which has resolved. Patient reports she did have some diarrhea which is now better. No vomiting, nausea, fever, URI symptoms.  Thinks it was related to something that she ate.  She doesn't feel quite back to par.  Hypothyroidism: Taking Synthroid 75 mcg daily with good compliance and no side effects.  She is asymptomatic without any changes in stool consistency, other than the self-limited illness as above, and without any cold/heat intolerance, hair/skin changes, palpitations.  T2DM with HLD: Patient is not currently on any medications.  She has had diet-controlled diabetes.  We have discussed statin therapy previously and she is not  currently on a statin.  Review of Systems  Constitutional: Positive for fatigue. Negative for activity change, appetite change and fever.  HENT: Negative.   Eyes: Negative.   Respiratory: Negative.   Cardiovascular: Negative.   Gastrointestinal: Positive for abdominal pain and diarrhea. Negative for blood in stool, constipation, nausea and vomiting.  Endocrine: Negative.   Genitourinary: Negative.   Musculoskeletal: Negative.   Skin: Negative.   Allergic/Immunologic: Negative.   Neurological: Negative.   Hematological: Negative.   Psychiatric/Behavioral: Negative.     Social History   Socioeconomic History  . Marital status: Married    Spouse name: Not on file  . Number of children: 2  . Years of education: Not on file  . Highest education level: Bachelor's degree (e.g., BA, AB, BS)  Occupational History  . Occupation: retired Arboriculturist   Tobacco Use  . Smoking status: Never Smoker  . Smokeless tobacco: Never Used  Substance and Sexual Activity  . Alcohol use: Yes    Alcohol/week: 0.0 standard drinks    Comment: rare glass of wine  . Drug use: No  . Sexual activity: Not Currently  Other Topics Concern  . Not on file  Social History Narrative  . Not on file   Social Determinants of Health   Financial Resource Strain:   . Difficulty of Paying Living Expenses: Not on file  Food Insecurity:   . Worried About Charity fundraiser in the Last Year: Not on file  . Ran Out of Food in the Last Year: Not on file  Transportation Needs:   . Lack of Transportation (Medical): Not on file  . Lack of  Transportation (Non-Medical): Not on file  Physical Activity:   . Days of Exercise per Week: Not on file  . Minutes of Exercise per Session: Not on file  Stress: Stress Concern Present  . Feeling of Stress : Rather much  Social Connections:   . Frequency of Communication with Friends and Family: Not on file  . Frequency of Social Gatherings with Friends and Family: Not on  file  . Attends Religious Services: Not on file  . Active Member of Clubs or Organizations: Not on file  . Attends Archivist Meetings: Not on file  . Marital Status: Not on file  Intimate Partner Violence:   . Fear of Current or Ex-Partner: Not on file  . Emotionally Abused: Not on file  . Physically Abused: Not on file  . Sexually Abused: Not on file    Past Medical History:  Diagnosis Date  . Allergy   . Arthritis   . Benign head tremor   . Diabetes mellitus ([place])    diet controlled  . Fibrocystic breast disease   . Heart murmur   . History of abnormal Pap smear   . Hypercholesterolemia   . Hyperthyroidism    s/p ablation     Patient Active Problem List   Diagnosis Date Noted  . Counseled about COVID-19 virus infection 08/04/2019  . Gastroenteritis 05/27/2017  . Vitamin D deficiency 06/08/2016  . S/P arthroscopy of knee 11/03/2015  . Murmur 08/21/2015  . Health care maintenance 02/17/2015  . Postablative hypothyroidism 04/27/2014  . Thyroid nodule 02/05/2014  . Osteopenia 08/09/2012  . Diabetes mellitus ([place]) 08/04/2012  . Hypercholesterolemia 08/04/2012    Past Surgical History:  Procedure Laterality Date  . BREAST BIOPSY  70's   benign  . CARPAL TUNNEL RELEASE     right hand  . KNEE ARTHROSCOPY Left 09/12/2015   Procedure: ARTHROSCOPY LEFT KNEE, PARTIAL MEDIAL MENISECTOMY, PARTIAL LATERAL MENISECTOMY, CHONDROPLASTY MEDIAL AND LATERAL;  Surgeon: Dereck Leep, MD;  Location: [facility] ORS;  Service: Orthopedics;  Laterality: Left;  . SHOULDER ARTHROSCOPY    . TONSILLECTOMY      Her family history includes Diabetes in her brother, brother, father, and sister; Heart disease in her brother, father, and mother. There is no history of Breast cancer or Colon cancer.   Current Outpatient Medications:  .  Apoaequorin (PREVAGEN PO), Take by mouth daily. , Disp: , Rfl:  .  Ascorbic Acid (VITAMIN C PO), Take by mouth daily. When remembers, Disp: , Rfl:  .   cholecalciferol (VITAMIN D) 1000 units tablet, Take 1,000 Units by mouth daily. , Disp: , Rfl:  .  levothyroxine (SYNTHROID) 75 MCG tablet, TAKE 1 TABLET BY MOUTH DAILY BEFORE BREAKFAST, Disp: 90 tablet, Rfl: 3  Patient Care Team: Virginia Crews, MD as PCP - General (Family Medicine) Leandrew Koyanagi, MD as Referring Physician (Ophthalmology) Brendolyn Patty, MD (Dermatology)     Objective:    Vitals: There were no vitals taken for this visit.  Physical Exam Speaking in full sentences in no apparent distress   Activities of Daily Living In your present state of health, do you have any difficulty performing the following activities: 06/27/2019  Hearing? N  Vision? N  Difficulty concentrating or making decisions? Y  Walking or climbing stairs? N  Dressing or bathing? N  Doing errands, shopping? N  Preparing Food and eating ? N  Using the Toilet? N  In the past six months, have you accidently leaked urine? N  Do you  have problems with loss of bowel control? N  Managing your Medications? N  Managing your Finances? N  Housekeeping or managing your Housekeeping? N  Some recent data might be hidden    Fall Risk Assessment Fall Risk  06/27/2019 01/25/2019 06/21/2018 03/30/2018 06/11/2017  Falls in the past year? 0 0 1 Yes No  Number falls in past yr: 0 - 0 1 -  Injury with Fall? 0 - 0 No -  Follow up - - Falls prevention discussed - -     Depression Screen PHQ 2/9 Scores 06/27/2019 06/21/2018 03/30/2018 06/11/2017  PHQ - 2 Score 1 1 0 0  PHQ- 9 Score - - 0 0    6CIT Screen 06/27/2019  What Year? 0 points  What month? 0 points  What time? 0 points  Count back from 20 0 points  Months in reverse 0 points  Repeat phrase 0 points  Total Score 0       Assessment & Plan:    F/u to AWV Reviewed patient's Family Medical History Reviewed and updated list of patient's medical providers Assessment of cognitive impairment was done Assessed patient's functional  ability Established a written schedule for health screening [place] Completed and Reviewed  Exercise Activities and Dietary recommendations Goals    . DIET - INCREASE WATER INTAKE     Recommend to drink at least 6-8 8oz glasses of water per day.     Marland Kitchen Healthy Lifestyle     Maintain drinking plenty of fluids, exercise regimen and low carb foods.       Immunization History  Administered Date(s) Administered  . Influenza Split 04/27/2012, 05/16/2013, 05/11/2014  . Influenza, High Dose Seasonal PF 03/30/2018  . Influenza,inj,Quad PF,6+ Mos 06/11/2017  . Influenza-Unspecified 04/24/2014, 06/04/2015, 06/24/2016, 05/25/2019  . Pneumococcal Conjugate-13 07/04/2013  . Pneumococcal Polysaccharide-23 09/12/2016    Health Maintenance  Topic Date Due  . TETANUS/TDAP  11/29/1947  . HEMOGLOBIN A1C  01/25/2019  . URINE MICROALBUMIN  03/31/2019  . OPHTHALMOLOGY EXAM  04/02/2019  . FOOT EXAM  07/28/2019  . DEXA SCAN  08/20/2023  . INFLUENZA VACCINE  Completed  . PNA vac Low Risk Adult  Completed  . MAMMOGRAM  Discontinued     Discussed health benefits of physical activity, and encouraged her to engage in regular exercise appropriate for her age and condition.    ------------------------------------------------------------------------------------------------------------  Problem List Items Addressed This Visit      Digestive   Gastroenteritis - Primary    Self-limited episode Hard to tell if it was viral or food related Still feeling fatigued, but abdominal cramping and diarrhea have resolved Discussed slowly advancing diet and staying well-hydrated Discussed return precautions        Endocrine   Diabetes mellitus ([place])    Reviewed most recent A1c Previously well controlled with low-carb diet Not on any medications Up-to-date on screenings and vaccinations Given the patient's advanced age, she is hesitant to come out for labs during the pandemic,  which I understand We will plan to recheck her A1c and other labs at her next in-person visit, hopefully after she is vaccinated for COVID-19      Postablative hypothyroidism    Previously well controlled and asymptomatic Last TSH reviewed Continue Synthroid at current dose Plan to recheck TSH at next in-person visit        Other   Hypercholesterolemia    Statins have been discussed with the patient multiple times by previous PCP and by myself and  she is declined them Reviewed last lipid panel Plan to recheck lipid panel and CMP at next in-person visit I have encouraged lifestyle interventions such as diet and exercise      Vitamin D deficiency    Continue daily vitamin D supplement Plan to recheck vitamin D level at next in-person visit with other labs      Counseled about COVID-19 virus infection    Given patient's comorbidities and advanced age, she is hesitant to come out for labs or office visits, which is understandable We discussed in depth the Covid vaccines that are available and the availability She is planning to have her son help her look into getting a vaccine when he is in town this coming weekend          Return in about 6 months (around 01/31/2020) for chronic disease f/u.   The entirety of the information documented in the History of Present Illness, Review of Systems and Physical Exam were personally obtained by me. Portions of this information were initially documented by Lynford Humphrey, CMA and reviewed by me for thoroughness and accuracy.    Sylvanus Telford, Dionne Bucy, MD MPH [facility]

## 2019-08-04 DIAGNOSIS — Z7189 Other specified counseling: Secondary | ICD-10-CM | POA: Insufficient documentation

## 2019-08-04 NOTE — Assessment & Plan Note (Signed)
Given patient's comorbidities and advanced age, she is hesitant to come out for labs or office visits, which is understandable We discussed in depth the Covid vaccines that are available and the availability She is planning to have her son help her look into getting a vaccine when he is in town this coming weekend

## 2019-08-04 NOTE — Assessment & Plan Note (Signed)
Reviewed most recent A1c Previously well controlled with low-carb diet Not on any medications Up-to-date on screenings and vaccinations Given the patient's advanced age, she is hesitant to come out for labs during the pandemic, which I understand We will plan to recheck her A1c and other labs at her next in-person visit, hopefully after she is vaccinated for COVID-19

## 2019-08-04 NOTE — Assessment & Plan Note (Signed)
Previously well controlled and asymptomatic Last TSH reviewed Continue Synthroid at current dose Plan to recheck TSH at next in-person visit

## 2019-08-04 NOTE — Assessment & Plan Note (Signed)
Continue daily vitamin D supplement Plan to recheck vitamin D level at next in-person visit with other labs

## 2019-08-04 NOTE — Assessment & Plan Note (Signed)
Statins have been discussed with the patient multiple times by previous PCP and by myself and she is declined them Reviewed last lipid panel Plan to recheck lipid panel and CMP at next in-person visit I have encouraged lifestyle interventions such as diet and exercise

## 2019-08-04 NOTE — Assessment & Plan Note (Signed)
Self-limited episode Hard to tell if it was viral or food related Still feeling fatigued, but abdominal cramping and diarrhea have resolved Discussed slowly advancing diet and staying well-hydrated Discussed return precautions

## 2019-11-07 ENCOUNTER — Ambulatory Visit (INDEPENDENT_AMBULATORY_CARE_PROVIDER_SITE_OTHER): Payer: Medicare Other | Admitting: Dermatology

## 2019-11-07 ENCOUNTER — Other Ambulatory Visit: Payer: Self-pay

## 2019-11-07 DIAGNOSIS — L237 Allergic contact dermatitis due to plants, except food: Secondary | ICD-10-CM | POA: Diagnosis not present

## 2019-11-07 DIAGNOSIS — L2389 Allergic contact dermatitis due to other agents: Secondary | ICD-10-CM

## 2019-11-07 MED ORDER — CLOBETASOL PROPIONATE 0.05 % EX CREA: TOPICAL_CREAM | CUTANEOUS | 1 refills | Status: DC

## 2019-11-07 MED ORDER — PREDNISONE 5 MG PO TABS: ORAL_TABLET | ORAL | 0 refills | Status: DC

## 2019-11-07 NOTE — Progress Notes (Signed)
   Follow-Up Visit   Subjective  Shannon Mckinney is a 86 y.o. female who presents for the following: Rash (L arm, fingers, L calf. Using 1% hydrocortisone cream.).  Got after working out in yard.  Usually gets poison ivy once a year.   The following portions of the chart were reviewed this encounter and updated as appropriate:     Review of Systems: No other skin or systemic complaints.  Objective  Well appearing patient in no apparent distress; mood and affect are within normal limits.  A focused examination was performed including arms, hands, legs. Relevant physical exam findings are noted in the Assessment and Plan.  Objective  Left Forearm, left hand, right hand, left calf, right ankle: Scaly erythematous papules and plaques +/- edema and vesiculation.   Assessment & Plan  Allergic contact dermatitis due to other agents Left Forearm, left hand, right hand, left calf, right ankle  Due to plant exposure.  Start Prednisone 5mg  tablets po qam as directed for 3 week taper, start at 60 mg. Pt has detailed instructions. Risks of prednisone taper discussed including mood irritability, insomnia, weight gain, stomach ulcers, increased risk of infection, increased blood sugar (diabetes), hypertension, osteoporosis with long-term or frequent use, and rare risk of avascular necrosis of the hip.  Patient is diabetic. Advised her blood sugar may increase while on Prednisone. Recommend Domeboro wraps to AAs rash, leave on 15 mins. Sample given. Start clobetasol cream Apply qd/bid AAs rash until improved. Avoid face, groin, axilla.    predniSONE (DELTASONE) 5 MG tablet - Left Forearm, left hand, right hand, left calf, right ankle  clobetasol cream (TEMOVATE) 0.05 % - Left Forearm, left hand, right hand, left calf, right ankle  Return in about 6 weeks (around 12/19/2019) for F/U rash and TBSE.   IJamesetta Orleans, CMA, am acting as scribe for Brendolyn Patty, MD .

## 2019-11-07 NOTE — Patient Instructions (Addendum)
3 Week Prednisone Taper  You will be given a prescription for 150 tablets of oral Prednisone. It is very important that you take this according to the exact schedule provided below. This type of regimen for taking medication is often called a "taper", because your dosage will steadily decrease over a two week period until it is discontinued altogether.  ALWAYS take this medicine with food to prevent it from irritating your stomach. You should also take your Prednisone during morning hours.  Call the clinic at [phone] if you gain more than two pounds in one day, notice swelling anywhere on your body, have shortness of breath, black or red bowel movements, brown or red vomitus, desire to drink large amounts of fluids, a fever, or extreme weakness.   Oral Prednisone over Three Weeks  Day  Week 1  Week 2  Week 3   1  12  tablets  9 tablets  5 tablets   2  12 tablets  8 tablets  5 tablets   3  11 tablets  8 tablets  4 tablets   4  11 tablets  7 tablets  4 tablets   5  10 tablets  7 tablets  3 tablets   6  10 tablets  6 tablets  2 tablets   7  9 tablets  6 tablets  1 tablet    Risks of prednisone taper discussed including mood irritability, insomnia, weight gain, stomach ulcers, increased risk of infection, increased blood sugar (diabetes), hypertension, osteoporosis with long-term or frequent use, and rare risk of avascular necrosis of the hip.

## 2019-11-17 ENCOUNTER — Other Ambulatory Visit: Payer: Self-pay | Admitting: Dermatology

## 2019-12-08 ENCOUNTER — Ambulatory Visit (INDEPENDENT_AMBULATORY_CARE_PROVIDER_SITE_OTHER): Payer: Medicare Other | Admitting: Family Medicine

## 2019-12-08 ENCOUNTER — Other Ambulatory Visit: Payer: Self-pay

## 2019-12-08 ENCOUNTER — Encounter: Payer: Self-pay | Admitting: Family Medicine

## 2019-12-08 VITALS — BP 129/76 | HR 87 | Temp 97.3°F | Resp 16 | Ht 62.0 in | Wt 122.0 lb

## 2019-12-08 DIAGNOSIS — E119 Type 2 diabetes mellitus without complications: Secondary | ICD-10-CM | POA: Diagnosis not present

## 2019-12-08 DIAGNOSIS — M8580 Other specified disorders of bone density and structure, unspecified site: Secondary | ICD-10-CM | POA: Diagnosis not present

## 2019-12-08 DIAGNOSIS — E7800 Pure hypercholesterolemia, unspecified: Secondary | ICD-10-CM

## 2019-12-08 DIAGNOSIS — E559 Vitamin D deficiency, unspecified: Secondary | ICD-10-CM

## 2019-12-08 DIAGNOSIS — E890 Postprocedural hypothyroidism: Secondary | ICD-10-CM

## 2019-12-08 NOTE — Progress Notes (Signed)
Established patient visit   Patient: Shannon Mckinney   DOB: 15-May-1929   86 y.o. Female  MRN: [id number] Visit Date: 12/08/2019  I,Sulibeya S Dimas,acting as a scribe for Lavon Paganini, MD.,have documented all relevant documentation on the behalf of Lavon Paganini, MD,as directed by  Lavon Paganini, MD while in the presence of Lavon Paganini, MD.  Today's healthcare provider: Lavon Paganini, MD   Chief Complaint  Patient presents with  . Diabetes  . Hyperlipidemia  . Hypothyroidism   Subjective    HPI Diabetes Mellitus Type II, Follow-up  Lab Results  Component Value Date   HGBA1C 6.1 (H) 07/27/2018   HGBA1C 6.3 (H) 03/30/2018   HGBA1C 6.4 06/24/2017   Wt Readings from Last 3 Encounters:  12/08/19 122 lb (55.3 kg)  09/08/18 129 lb 9.6 oz (58.8 kg)  08/31/18 130 lb (59 kg)   Last seen for diabetes 6 months ago.  Management since then includes no changes. Patient is not on any medications for diabetes. Symptoms: No fatigue No foot ulcerations  No appetite changes No nausea  No paresthesia of the feet  No polydipsia  No polyuria Yes visual disturbances   No vomiting     Home blood sugar records: not being checked  Episodes of hypoglycemia? No    Current insulin regiment: none Most Recent Eye Exam: not up to date Current exercise: housecleaning Current diet habits: in general, a "healthy" diet    Pertinent Labs: Lab Results  Component Value Date   CHOL 287 (H) 07/27/2018   HDL 67 07/27/2018   LDLCALC 193 (H) 07/27/2018   LDLDIRECT 130.6 06/22/2013   TRIG 137 07/27/2018   CHOLHDL 4.3 07/27/2018   Lab Results  Component Value Date   NA 140 07/27/2018   K 3.9 07/27/2018   CREATININE 0.89 07/27/2018   GFRNONAA 58 (L) 07/27/2018   GFRAA 67 07/27/2018   GLUCOSE 106 (H) 07/27/2018     --------------------------------------------------------------------------------------------------- Hypothyroid, follow-up  Lab Results  Component Value  Date   TSH 1.700 05/28/2018   TSH 0.367 (L) 03/30/2018   TSH 0.64 06/24/2017   Wt Readings from Last 3 Encounters:  12/08/19 122 lb (55.3 kg)  09/08/18 129 lb 9.6 oz (58.8 kg)  08/31/18 130 lb (59 kg)    She was last seen for hypothyroid 6 months ago.  Management since that visit includes no changes. She reports excellent compliance with treatment. She is not having side effects.   Symptoms: No change in energy level No constipation No diarrhea No heat / cold intolerance No nervousness No palpitations No weight changes  -----------------------------------------------------------------------------------------  Lipid/Cholesterol, Follow-up  Last lipid panel Other pertinent labs  Lab Results  Component Value Date   CHOL 287 (H) 07/27/2018   HDL 67 07/27/2018   LDLCALC 193 (H) 07/27/2018   LDLDIRECT 130.6 06/22/2013   TRIG 137 07/27/2018   CHOLHDL 4.3 07/27/2018   Lab Results  Component Value Date   ALT 10 07/27/2018   AST 14 07/27/2018   PLT 183.0 06/24/2017   TSH 1.700 05/28/2018     She was last seen for this 6 months ago.  Management since that visit includes no changes. Patient is not taking any medication. Symptoms: No chest pain No chest pressure/discomfort  No dyspnea No lower extremity edema  No numbness or tingling of extremity No orthopnea  No palpitations No paroxysmal nocturnal dyspnea  No speech difficulty No syncope   Current diet: in general, a "healthy" diet   Current  exercise: housecleaning  The ASCVD Risk score Mikey Bussing DC Jr., et al., 2013) failed to calculate for the following reasons:   The 2013 ASCVD risk score is only valid for ages 60 to 62  ---------------------------------------------------------------------------------------------------    Patient Active Problem List   Diagnosis Date Noted  . Gastroenteritis 05/27/2017  . Vitamin D deficiency 06/08/2016  . S/P arthroscopy of knee 11/03/2015  . Murmur 08/21/2015  .  Postablative hypothyroidism 04/27/2014  . Thyroid nodule 02/05/2014  . Osteopenia 08/09/2012  . Diabetes mellitus ([place]) 08/04/2012  . Hypercholesterolemia 08/04/2012   Social History   Tobacco Use  . Smoking status: Never Smoker  . Smokeless tobacco: Never Used  Substance Use Topics  . Alcohol use: Yes    Alcohol/week: 0.0 standard drinks    Comment: rare glass of wine  . Drug use: No       Medications: Outpatient Medications Prior to Visit  Medication Sig  . cholecalciferol (VITAMIN D) 1000 units tablet Take 1,000 Units by mouth daily.   Marland Kitchen levothyroxine (SYNTHROID) 75 MCG tablet TAKE 1 TABLET BY MOUTH DAILY BEFORE BREAKFAST  . [DISCONTINUED] Apoaequorin (PREVAGEN PO) Take by mouth daily.   . [DISCONTINUED] Ascorbic Acid (VITAMIN C PO) Take by mouth daily. When remembers  . [DISCONTINUED] clobetasol cream (TEMOVATE) 0.05 % Apply to affected areas of rash 1-2 times daily until improved. Avoid face, groin, underarms.  . [DISCONTINUED] hydrocortisone 2.5 % cream APPLY TO AFFECTED AREAS ONCE TO TWICE DAILY  . [DISCONTINUED] predniSONE (DELTASONE) 5 MG tablet Take by mouth every morning as directed with food until gone for 3 week taper. Patient has detailed instructions.   No facility-administered medications prior to visit.    Review of Systems  Constitutional: Negative.   Eyes: Positive for visual disturbance. Negative for photophobia, pain, discharge, redness and itching.  Respiratory: Negative.   Cardiovascular: Negative.   Endocrine: Negative.   Neurological: Positive for tremors.  Psychiatric/Behavioral: Negative.       Objective    BP 129/76 (BP Location: Left Arm, Patient Position: Sitting, Cuff Size: Normal)   Pulse 87   Temp (!) 97.3 F (36.3 C) (Temporal)   Resp 16   Ht 5\' 2"  (1.575 m)   Wt 122 lb (55.3 kg)   BMI 22.31 kg/m  BP Readings from Last 3 Encounters:  12/08/19 129/76  09/08/18 133/74  08/31/18 (!) 112/52   Wt Readings from Last 3 Encounters:   12/08/19 122 lb (55.3 kg)  09/08/18 129 lb 9.6 oz (58.8 kg)  08/31/18 130 lb (59 kg)      Physical Exam Vitals reviewed.  Constitutional:      General: She is not in acute distress.    Appearance: Normal appearance. She is well-developed. She is not diaphoretic.  HENT:     Head: Normocephalic and atraumatic.  Eyes:     General: No scleral icterus.    Conjunctiva/sclera: Conjunctivae normal.  Neck:     Thyroid: No thyromegaly.  Cardiovascular:     Rate and Rhythm: Normal rate and regular rhythm.     Pulses: Normal pulses.     Heart sounds: Normal heart sounds. No murmur.  Pulmonary:     Effort: Pulmonary effort is normal. No respiratory distress.     Breath sounds: Normal breath sounds. No wheezing, rhonchi or rales.  Musculoskeletal:     Cervical back: Neck supple.     Right lower leg: No edema.     Left lower leg: No edema.  Feet:  Comments:     Lymphadenopathy:     Cervical: No cervical adenopathy.  Skin:    General: Skin is warm and dry.     Findings: No rash.  Neurological:     Mental Status: She is alert and oriented to person, place, and time. Mental status is at baseline.  Psychiatric:        Mood and Affect: Mood normal.        Behavior: Behavior normal.     Diabetic Foot Exam - Simple   Simple Foot Form Diabetic Foot exam was performed with the following findings: Yes 12/08/2019  2:01 PM  Visual Inspection No deformities, no ulcerations, no other skin breakdown bilaterally: Yes Sensation Testing Intact to touch and monofilament testing bilaterally: Yes Pulse Check Posterior Tibialis and Dorsalis pulse intact bilaterally: Yes Comments     No results found for any visits on 12/08/19.  Assessment & Plan     Problem List Items Addressed This Visit      Endocrine   Diabetes mellitus ([place])    Well controlled with last A1c was 6.1 checked on 07/27/2018 Not on any medications Check lab today Foot exam done today, urine microalbumin ordered  today Advised to schedule appointment with [facility] Discussed diet and exercise F/u in 6 months       Relevant Orders   Microalbumin, urine   Hemoglobin A1c   Comprehensive metabolic panel   Postablative hypothyroidism - Primary    Previously well controlled Continue Synthroid at current dose Recheck TSH and adjust Synthroid as indicated        Relevant Orders   TSH     Musculoskeletal and Integument   Osteopenia    Continue Vit D suopplement - recheck level      Relevant Orders   VITAMIN D 25 Hydroxy (Vit-D Deficiency, Fractures)     Other   Hypercholesterolemia    Reviewed last lipid panel Not currently on a statin Recheck FLP and CMP Discussed diet and exercise       Relevant Orders   Lipid Panel With LDL/HDL Ratio   Comprehensive metabolic panel   Vitamin D deficiency    Continue supplement - recheck level      Relevant Orders   VITAMIN D 25 Hydroxy (Vit-D Deficiency, Fractures)       Return in about 6 months (around 06/09/2020) for CPE, AWV.       I, Lavon Paganini, MD, have reviewed all documentation for this visit. The documentation on 12/08/19 for the exam, diagnosis, procedures, and orders are all accurate and complete.   Xaviera Flaten, Dionne Bucy, MD, MPH [facility]

## 2019-12-08 NOTE — Patient Instructions (Signed)
Hypothyroidism  Hypothyroidism is when the thyroid gland does not make enough of certain hormones (it is underactive). The thyroid gland is a small gland located in the lower front part of the neck, just in front of the windpipe (trachea). This gland makes hormones that help control how the body uses food for energy (metabolism) as well as how the heart and brain function. These hormones also play a role in keeping your bones strong. When the thyroid is underactive, it produces too little of the hormones thyroxine (T4) and triiodothyronine (T3). What are the causes? This condition may be caused by:  Hashimoto's disease. This is a disease in which the body's disease-fighting system (immune system) attacks the thyroid gland. This is the most common cause.  Viral infections.  Pregnancy.  Certain medicines.  Birth defects.  Past radiation treatments to the head or neck for cancer.  Past treatment with radioactive iodine.  Past exposure to radiation in the environment.  Past surgical removal of part or all of the thyroid.  Problems with a gland in the center of the brain (pituitary gland).  Lack of enough iodine in the diet. What increases the risk? You are more likely to develop this condition if:  You are female.  You have a family history of thyroid conditions.  You use a medicine called lithium.  You take medicines that affect the immune system (immunosuppressants). What are the signs or symptoms? Symptoms of this condition include:  Feeling as though you have no energy (lethargy).  Not being able to tolerate cold.  Weight gain that is not explained by a change in diet or exercise habits.  Lack of appetite.  Dry skin.  Coarse hair.  Menstrual irregularity.  Slowing of thought processes.  Constipation.  Sadness or depression. How is this diagnosed? This condition may be diagnosed based on:  Your symptoms, your medical history, and a physical exam.  Blood  tests. You may also have imaging tests, such as an ultrasound or MRI. How is this treated? This condition is treated with medicine that replaces the thyroid hormones that your body does not make. After you begin treatment, it may take several weeks for symptoms to go away. Follow these instructions at home:  Take over-the-counter and prescription medicines only as told by your health care provider.  If you start taking any new medicines, tell your health care provider.  Keep all follow-up visits as told by your health care provider. This is important. ? As your condition improves, your dosage of thyroid hormone medicine may change. ? You will need to have blood tests regularly so that your health care provider can monitor your condition. Contact a health care provider if:  Your symptoms do not get better with treatment.  You are taking thyroid replacement medicine and you: ? Sweat a lot. ? Have tremors. ? Feel anxious. ? Lose weight rapidly. ? Cannot tolerate heat. ? Have emotional swings. ? Have diarrhea. ? Feel weak. Get help right away if you have:  Chest pain.  An irregular heartbeat.  A rapid heartbeat.  Difficulty breathing. Summary  Hypothyroidism is when the thyroid gland does not make enough of certain hormones (it is underactive).  When the thyroid is underactive, it produces too little of the hormones thyroxine (T4) and triiodothyronine (T3).  The most common cause is Hashimoto's disease, a disease in which the body's disease-fighting system (immune system) attacks the thyroid gland. The condition can also be caused by viral infections, medicine, pregnancy, or past   radiation treatment to the head or neck.  Symptoms may include weight gain, dry skin, constipation, feeling as though you do not have energy, and not being able to tolerate cold.  This condition is treated with medicine to replace the thyroid hormones that your body does not make. This information  is not intended to replace advice given to you by your health care provider. Make sure you discuss any questions you have with your health care provider. Document Revised: 06/26/2017 Document Reviewed: 06/24/2017 Elsevier Patient Education  2020 Elsevier Inc.  

## 2019-12-08 NOTE — Assessment & Plan Note (Signed)
Previously well controlled Continue Synthroid at current dose  Recheck TSH and adjust Synthroid as indicated   

## 2019-12-08 NOTE — Assessment & Plan Note (Signed)
Continue Vit D suopplement - recheck level

## 2019-12-08 NOTE — Assessment & Plan Note (Addendum)
Well controlled with last A1c was 6.1 checked on 07/27/2018 Not on any medications Check lab today Foot exam done today, urine microalbumin ordered today Advised to schedule appointment with [facility] Discussed diet and exercise F/u in 6 months

## 2019-12-08 NOTE — Assessment & Plan Note (Signed)
Reviewed last lipid panel Not currently on a statin Recheck FLP and CMP Discussed diet and exercise  

## 2019-12-08 NOTE — Assessment & Plan Note (Signed)
Continue supplement - recheck level

## 2019-12-09 LAB — LIPID PANEL WITH LDL/HDL RATIO
Cholesterol, Total: 295 mg/dL — ABNORMAL HIGH (ref 100–199)
HDL: 82 mg/dL (ref >39)
LDL Chol Calc (NIH): 177 mg/dL — ABNORMAL HIGH (ref 0–99)
LDL/HDL Ratio: 2.2 ratio (ref 0.0–3.2)
Triglycerides: 200 mg/dL — ABNORMAL HIGH (ref 0–149)
VLDL Cholesterol Cal: 36 mg/dL (ref 5–40)

## 2019-12-09 LAB — COMPREHENSIVE METABOLIC PANEL
ALT: 15 IU/L (ref 0–32)
AST: 16 IU/L (ref 0–40)
Albumin/Globulin Ratio: 2.1 (ref 1.2–2.2)
Albumin: 4.7 g/dL — ABNORMAL HIGH (ref 3.6–4.6)
Alkaline Phosphatase: 74 IU/L (ref 39–117)
BUN/Creatinine Ratio: 16 (ref 12–28)
BUN: 13 mg/dL (ref 8–27)
Bilirubin Total: 0.4 mg/dL (ref 0.0–1.2)
CO2: 27 mmol/L (ref 20–29)
Calcium: 9.7 mg/dL (ref 8.7–10.3)
Chloride: 97 mmol/L (ref 96–106)
Creatinine, Ser: 0.83 mg/dL (ref 0.57–1.00)
GFR calc Af Amer: 72 mL/min/{1.73_m2} (ref >59)
GFR calc non Af Amer: 63 mL/min/{1.73_m2} (ref >59)
Globulin, Total: 2.2 g/dL (ref 1.5–4.5)
Glucose: 103 mg/dL — ABNORMAL HIGH (ref 65–99)
Potassium: 3.6 mmol/L (ref 3.5–5.2)
Sodium: 139 mmol/L (ref 134–144)
Total Protein: 6.9 g/dL (ref 6.0–8.5)

## 2019-12-09 LAB — HEMOGLOBIN A1C
Est. average glucose Bld gHb Est-mCnc: 148 mg/dL
Hgb A1c MFr Bld: 6.8 % — ABNORMAL HIGH (ref 4.8–5.6)

## 2019-12-09 LAB — VITAMIN D 25 HYDROXY (VIT D DEFICIENCY, FRACTURES): Vit D, 25-Hydroxy: 35.5 ng/mL (ref 30.0–100.0)

## 2019-12-09 LAB — MICROALBUMIN, URINE: Microalbumin, Urine: 14.9 ug/mL

## 2019-12-09 LAB — TSH: TSH: 8.57 u[IU]/mL — ABNORMAL HIGH (ref 0.450–4.500)

## 2019-12-13 ENCOUNTER — Telehealth: Payer: Self-pay

## 2019-12-13 DIAGNOSIS — E890 Postprocedural hypothyroidism: Secondary | ICD-10-CM

## 2019-12-13 NOTE — Telephone Encounter (Signed)
Pt. Called back, given results and instructions. Reports she has been taking her thyroid medication as ordered. Requests new dose be sent into [facility].

## 2019-12-13 NOTE — Telephone Encounter (Signed)
-----   Message from Virginia Crews, MD sent at 12/13/2019  9:44 AM EDT ----- Cholesterol remains high.  As we have previously discussed, patient does not want statin, so recommend diet low in saturated fat and regular exercise - 30 min at least 5 times per week.  A1c still well controlled.  Normal kidney function, liver function, electrolytes.  TSH is slightly high, meaning that Synthroid dose may be too low.  If she is taking it with good compliance with no missed doses, we could increase dose slightly to 88 mcg daily.  If she has had missed doses, could leave dose the same and work on taking it regularly.

## 2019-12-13 NOTE — Telephone Encounter (Signed)
LMTCB, PEC may give result.  

## 2019-12-14 MED ORDER — LEVOTHYROXINE SODIUM 88 MCG PO TABS: 88.0000 ug | ORAL_TABLET | Freq: Every day | ORAL | 3 refills | Status: DC

## 2019-12-14 NOTE — Telephone Encounter (Signed)
Patient advised as below. Patient verbalizes understanding and is in agreement with treatment plan.  

## 2019-12-14 NOTE — Telephone Encounter (Signed)
Ok to send 88 mcg dose #90 r3, recheck TSH in 2-3 months

## 2019-12-21 ENCOUNTER — Other Ambulatory Visit: Payer: Self-pay

## 2019-12-21 ENCOUNTER — Ambulatory Visit (INDEPENDENT_AMBULATORY_CARE_PROVIDER_SITE_OTHER): Payer: Medicare Other | Admitting: Dermatology

## 2019-12-21 DIAGNOSIS — L237 Allergic contact dermatitis due to plants, except food: Secondary | ICD-10-CM | POA: Diagnosis not present

## 2019-12-21 NOTE — Patient Instructions (Addendum)
Recommend daily broad spectrum sunscreen SPF 30+ to sun-exposed areas, reapply every 2 hours as needed. Call for new or changing lesions.  Recommend [place] wraps to affected areas when rash appears, leave on 15 mins. Can be found in little packets at local pharmacy. It comes as a powder and can be mixed with water and then applied.

## 2019-12-21 NOTE — Progress Notes (Signed)
   Follow-Up Visit   Subjective  Shannon Mckinney is a 86 y.o. female who presents for the following: Follow-up.  Patient here for 6 week allergic contact dermatitis follow up. She did a 3 week prednisone taper and was using clobetasol cream. Patient advises she is much improved other than a spot that looks like a bug bite. Overall doing much better.  Not itching.  The following portions of the chart were reviewed this encounter and updated as appropriate:     Review of Systems:  No other skin or systemic complaints except as noted in HPI or Assessment and Plan.  Objective  Well appearing patient in no apparent distress; mood and affect are within normal limits.  A focused examination was performed including arms. Relevant physical exam findings are noted in the Assessment and Plan.  Objective  Left Forearm: Mild erythema with xerosis on left forearm.  Dusky pink firm papule c/w bite reaction.    Assessment & Plan  Allergic contact dermatitis due to plants, except food Left Forearm  Improved post prednisone taper Continue clobetasol cream 1-2x daily to spot treat prn itch. Avoid F/G/A.   Return in about 4 months (around 04/22/2020) for TBSE.  Graciella Belton, RMA, am acting as scribe for Brendolyn Patty, MD .  Documentation: I have reviewed the above documentation for accuracy and completeness, and I agree with the above.  Brendolyn Patty MD

## 2019-12-23 ENCOUNTER — Ambulatory Visit: Payer: Medicare Other | Admitting: Dermatology

## 2020-01-11 ENCOUNTER — Telehealth: Payer: Self-pay

## 2020-01-11 NOTE — Telephone Encounter (Signed)
Copied from [place] [id number]. Topic: General - Call Back - No Documentation >> Jan 11, 2020  3:39 PM Jaynie Collins D wrote: Reason for CRM: Patient son is notifying office that the insurance company is faxing over a document for a request for patient to go to a  home health care facility. Patient's son would like for someone to contact him back by phone or email [email or web address], [phone] once document is received.

## 2020-01-16 NOTE — Telephone Encounter (Signed)
I spoke with Mr. Shannon Mckinney and advised him we don't have the forms yet.  He states he will call the insurance company back and have them fax it again.   Thanks,   -Mickel Baas

## 2020-01-16 NOTE — Telephone Encounter (Signed)
I have not come across this document at all

## 2020-02-13 DIAGNOSIS — E890 Postprocedural hypothyroidism: Secondary | ICD-10-CM | POA: Diagnosis not present

## 2020-02-14 LAB — TSH: TSH: 3.87 u[IU]/mL (ref 0.450–4.500)

## 2020-02-15 ENCOUNTER — Telehealth: Payer: Self-pay

## 2020-02-15 NOTE — Telephone Encounter (Signed)
Copied from [place] [id number]. Topic: General - Call Back - No Documentation >> Feb 15, 2020 12:53 PM Erick Blinks wrote: Pt's son Alvita Fana called to report that [employer] insurance has faxed over forms for PCP to sign. Pt needs to get started with home health assistance as soon as possible. Please contact son, he wants to resolve this with another PCP since Dr. B is out.  Best contact: [phone]

## 2020-02-16 NOTE — Telephone Encounter (Signed)
Pt son Louie Casa stated the forms will be faxed and it would like to ask that the start date be prior to June 21. Cb# [phone]

## 2020-02-16 NOTE — Telephone Encounter (Signed)
Spoke with pts son Shannon Mckinney to see if they could re-fax info to our office. He reports that I would have to call [employer] insurance directly to get the forms. He reports that the pt has dementia, and would benefit from home health assistance. Called [phone] to get another form faxed to the office. Will await fax.

## 2020-02-16 NOTE — Telephone Encounter (Signed)
Insurance Co ([employer]) reports that they can not give [place] any forms without pt or pts POA giving permission to [place]. I advised that I spoke with pts son Shannon Mckinney and she Shannon Mckinney) says that he will have to call and send the info to [place].   Called [place] and no answer. Unable to leave a VM due to mailbox full. PEC please give him this number to call [phone] to request forms to be faxed to our office at [phone].   Thanks.

## 2020-02-17 NOTE — Telephone Encounter (Signed)
Tried calling Shannon Mckinney and his phone is unavailable. Unable to leave a VM due to mailbox being full. Will try again later. OK for PEC to let him know that pt will need an appt to have forms filled out. Since Dr. Jacinto Reap is out, another provider will need to assess the pt to fill out home health form.

## 2020-02-17 NOTE — Telephone Encounter (Signed)
Pt normally see's Dr. Jacinto Reap. Her son is requesting a form to be filled out so she can have home assistance. She has dementia, and he feels that home health would be beneficial. The insurance co that the pt has, provides this service for her.   Received fax. Hard copy on your desk to review. Thanks!

## 2020-02-17 NOTE — Telephone Encounter (Signed)
She'll need a visit, thank you.

## 2020-02-21 DIAGNOSIS — H2513 Age-related nuclear cataract, bilateral: Secondary | ICD-10-CM | POA: Diagnosis not present

## 2020-02-21 DIAGNOSIS — H35371 Puckering of macula, right eye: Secondary | ICD-10-CM | POA: Diagnosis not present

## 2020-02-21 LAB — HM DIABETES EYE EXAM

## 2020-02-21 NOTE — Telephone Encounter (Signed)
Apt 02/23/2020 at 9am   Thanks,   -Mickel Baas

## 2020-02-22 ENCOUNTER — Ambulatory Visit: Payer: Medicare Other | Admitting: Physician Assistant

## 2020-02-23 ENCOUNTER — Ambulatory Visit (INDEPENDENT_AMBULATORY_CARE_PROVIDER_SITE_OTHER): Payer: Medicare Other | Admitting: Physician Assistant

## 2020-02-23 ENCOUNTER — Encounter: Payer: Self-pay | Admitting: Physician Assistant

## 2020-02-23 ENCOUNTER — Other Ambulatory Visit: Payer: Self-pay

## 2020-02-23 VITALS — BP 128/68 | HR 84 | Temp 96.1°F | Wt 122.0 lb

## 2020-02-23 DIAGNOSIS — R4189 Other symptoms and signs involving cognitive functions and awareness: Secondary | ICD-10-CM | POA: Diagnosis not present

## 2020-02-23 NOTE — Progress Notes (Signed)
I,Laura E Walsh,acting as a Education administrator for [employer], PA-C.,have documented all relevant documentation on the behalf of Trinna Post, PA-C,as directed by  Trinna Post, PA-C while in the presence of Trinna Post, PA-C.   Established patient visit   Patient: Shannon Mckinney   DOB: May 04, 1929   86 y.o. Female  MRN: [id number] Visit Date: 02/23/2020  Today's healthcare provider: Trinna Post, PA-C   Chief Complaint  Patient presents with  . Form Completion  . Dementia  . Tremors   Subjective    HPI   Pt is coming in today to fill out insurance forms for home health.  Pt's husband just past away in the last few weeks, and was her primary caretaker. Her son presents with her today. Due to her husband's death, the patient now lives independently in her home. She has some short term and long term memory loss, son cites example of her forgetting the appointment time today. She does not have family nearby. Son reports she needs assistance with ADLs such as toileting, dressing, bathing, meal prep. Patient would like to live in her own home for as long as possible.   Patient Active Problem List   Diagnosis Date Noted  . Gastroenteritis 05/27/2017  . Vitamin D deficiency 06/08/2016  . S/P arthroscopy of knee 11/03/2015  . Murmur 08/21/2015  . Postablative hypothyroidism 04/27/2014  . Thyroid nodule 02/05/2014  . Osteopenia 08/09/2012  . Diabetes mellitus ([place]) 08/04/2012  . Hypercholesterolemia 08/04/2012   Social History   Tobacco Use  . Smoking status: Never Smoker  . Smokeless tobacco: Never Used  Vaping Use  . Vaping Use: Never used  Substance Use Topics  . Alcohol use: Yes    Alcohol/week: 0.0 standard drinks    Comment: rare glass of wine  . Drug use: No   Allergies  Allergen Reactions  . Mysoline [Primidone] Other (See Comments)    "unknown"  . Penicillins Rash       Medications: Outpatient Medications Prior to Visit  Medication Sig  .  levothyroxine (SYNTHROID) 88 MCG tablet Take 1 tablet (88 mcg total) by mouth daily before breakfast.  . cholecalciferol (VITAMIN D) 1000 units tablet Take 1,000 Units by mouth daily.  (Patient not taking: Reported on 02/23/2020)   No facility-administered medications prior to visit.    Review of Systems  Constitutional: Negative.   HENT: Positive for hearing loss.   Eyes: Negative.   Respiratory: Negative.   Cardiovascular: Negative.   Gastrointestinal: Negative.   Genitourinary: Positive for enuresis.  Neurological: Positive for tremors.      Objective    BP 128/68 (BP Location: Left Arm, Patient Position: Sitting, Cuff Size: Large)   Pulse 84   Temp (!) 96.1 F (35.6 C) (Temporal)   Wt 122 lb (55.3 kg)   SpO2 95%   BMI 22.31 kg/m    Physical Exam Constitutional:      Appearance: Normal appearance.  Cardiovascular:     Rate and Rhythm: Normal rate and regular rhythm.     Pulses: Normal pulses.     Heart sounds: Murmur heard.   Pulmonary:     Effort: Pulmonary effort is normal.     Breath sounds: Normal breath sounds.  Skin:    General: Skin is warm and dry.  Neurological:     Mental Status: She is alert.     Motor: Tremor present.  Psychiatric:        Mood and Affect:  Mood and affect normal.        Behavior: Behavior normal.        Thought Content: Thought content normal.        Cognition and Memory: Memory is impaired. She exhibits impaired recent memory.       No results found for any visits on 02/23/20.  Assessment & Plan    1. Cognitive impairment  Have filled out form today. Will fax to designated number. Have specified for 16 hours a day of home health aide or CNA. Suggested to son and patient that they consider looking for long term facilities now as this need may arise in the future. Offered referral to social worker to orient them to this process, they decline at this time.   I have spent 15 minutes with this patient, >50% of which was spent on  counseling and coordination of care.    Return if symptoms worsen or fail to improve.      ITrinna Post, PA-C, have reviewed all documentation for this visit. The documentation on 02/24/20 for the exam, diagnosis, procedures, and orders are all accurate and complete.    Paulene Floor  [facility] [phone] (phone) [phone] (fax)  [place]

## 2020-02-24 NOTE — Patient Instructions (Signed)
Mild Neurocognitive Disorder Mild neurocognitive disorder, formerly known as mild cognitive impairment, is a disorder in which memory does not work as well as it should. This disorder may also cause problems with other mental functions, including thought, communication, behavior, and completion of tasks. These problems can be noticed and measured, but they usually do not interfere with daily activities or the ability to live independently. Mild neurocognitive disorder typically develops after 84 years of age, but it can also develop at younger ages. It is not as serious as major neurocognitive disorder, formerly known as dementia, but it may be the first sign of it. Generally, symptoms of this condition get worse over time. In rare cases, symptoms can get better. What are the causes? This condition may be caused by:  Brain disorders like Alzheimer's disease, Parkinson's disease, and other conditions that gradually damage nerve cells (neurodegenerative conditions).  Diseases that affect blood vessels in the brain and result in small strokes.  Certain infections, such as HIV.  Traumatic brain injury.  Other medical conditions, such as brain tumors, underactive thyroid (hypothyroidism), and vitamin B12 deficiency.  Use of certain drugs or prescription medicines. What increases the risk? The following factors may make you more likely to develop this condition:  Being older than 78.  Being female.  Low education level.  Diabetes, high blood pressure, high cholesterol, and other conditions that increase the risk for blood vessel diseases.  Untreated or undertreated sleep apnea.  Having a certain type of gene that can be passed from parent to child (inherited).  Chronic health problems such as heart disease, lung disease, liver disease, kidney disease, or depression. What are the signs or symptoms? Symptoms of this condition include:  Difficulty remembering. You may: ? Forget names, phone  numbers, or details of recent events. ? Forget social events and appointments. ? Repeatedly forget where you put your car keys or other items.  Difficulty thinking and solving problems. You may have trouble with complex tasks, such as: ? Paying bills. ? Driving in unfamiliar places.  Difficulty communicating. You may have trouble: ? Finding the right word or naming an object. ? Forming a sentence that makes sense, or understanding what you read or hear.  Changes in your behavior or personality. When this happens, you may: ? Lose interest in the things that you used to enjoy. ? Withdraw from social situations. ? Get angry more easily than usual. ? Act before thinking. How is this diagnosed? This condition is diagnosed based on:  Your symptoms. Your health care provider may ask you and the people you spend time with, such as family and friends, about your symptoms.  Evaluation of mental functions (neuropsychological testing). Your health care provider may refer you to a neurologist or mental health specialist to evaluate your mental functions in detail. To identify the cause of your condition, your health care provider may:  Get a detailed medical history.  Ask about use of alcohol, drugs, and prescription medicines.  Do a physical exam.  Order blood tests and brain imaging exams. How is this treated? Mild neurocognitive disorder that is caused by medicine use, drug use, infection, or another medical condition may improve when the cause is treated, or when medicines or drugs are stopped. If this disorder has another cause, it generally does not improve and may get worse. In these cases, the goal of treatment is to help you manage the loss of mental function. Treatments in these cases include:  Medicine. Medicine mainly helps memory and  behavior symptoms.  Talk therapy. Talk therapy provides education, emotional support, memory aids, and other ways of making up for problems with  mental function.  Lifestyle changes, including: ? Getting regular exercise. ? Eating a healthy diet that includes omega-3 fatty acids. ? Challenging your thinking and memory skills. ? Having more social interaction. Follow these instructions at home: Eating and drinking   Drink enough fluid to keep your urine pale yellow.  Eat a healthy diet that includes omega-3 fatty acids. These can be found in: ? Fish. ? Nuts. ? Leafy vegetables. ? Vegetable oils.  If you drink alcohol: ? Limit how much you use to:  0-1 drink a day for women.  0-2 drinks a day for men. ? Be aware of how much alcohol is in your drink. In the [place] one drink equals one 12 oz bottle of beer (355 mL), one 5 oz glass of wine (148 mL), or one 1 oz glass of hard liquor (44 mL). Lifestyle   Get regular exercise as told by your health care provider.  Do not use any products that contain nicotine or tobacco, such as cigarettes, e-cigarettes, and chewing tobacco. If you need help quitting, ask your health care provider.  Practice ways to manage stress. If you need help managing stress, ask your health care provider.  Continue to have social interaction.  Keep your mind active with stimulating activities you enjoy, such as reading or playing games.  Make sure to get quality sleep. Follow these tips: ? Avoid napping during the day. ? Keep your sleeping area dark and cool. ? Avoid exercising during the few hours before you go to bed. ? Avoid caffeine products in the evening. General instructions  Take over-the-counter and prescription medicines only as told by your health care provider. Your health care provider may recommend that you avoid taking medicines that can affect thinking, such as pain medicines or sleep medicines.  Work with your health care provider to find out what you need help with and what your safety needs are.  Keep all follow-up visits as told by your health care provider. This is  important. Where to find more information  [employer] on Aging: http://kim-miller.com/ Contact a health care provider if:  You have any new symptoms. Get help right away if:  You develop new confusion or your confusion gets worse.  You act in ways that place you or your family in danger. Summary  Mild neurocognitive disorder is a disorder in which memory does not work as well as it should.  Mild neurocognitive disorder can have many causes. It may be the first stage of dementia.  To manage your condition, get regular exercise, keep your mind active, get quality sleep, and eat a healthy diet. This information is not intended to replace advice given to you by your health care provider. Make sure you discuss any questions you have with your health care provider. Document Revised: 02/14/2019 Document Reviewed: 02/14/2019 Elsevier Patient Education  [place].

## 2020-02-29 ENCOUNTER — Encounter: Payer: Self-pay | Admitting: Family Medicine

## 2020-03-05 ENCOUNTER — Telehealth: Payer: Self-pay

## 2020-03-05 NOTE — Telephone Encounter (Signed)
-----   Message from Virginia Crews, MD sent at 03/05/2020  8:28 AM EDT ----- Normal thyroid function.

## 2020-03-05 NOTE — Telephone Encounter (Signed)
Patient advised as below.  

## 2020-04-23 DIAGNOSIS — E039 Hypothyroidism, unspecified: Secondary | ICD-10-CM | POA: Diagnosis not present

## 2020-04-23 DIAGNOSIS — H2511 Age-related nuclear cataract, right eye: Secondary | ICD-10-CM | POA: Diagnosis not present

## 2020-04-24 ENCOUNTER — Ambulatory Visit (INDEPENDENT_AMBULATORY_CARE_PROVIDER_SITE_OTHER): Payer: Medicare Other | Admitting: Dermatology

## 2020-04-24 ENCOUNTER — Other Ambulatory Visit: Payer: Self-pay

## 2020-04-24 DIAGNOSIS — L814 Other melanin hyperpigmentation: Secondary | ICD-10-CM | POA: Diagnosis not present

## 2020-04-24 DIAGNOSIS — L578 Other skin changes due to chronic exposure to nonionizing radiation: Secondary | ICD-10-CM

## 2020-04-24 DIAGNOSIS — Z85828 Personal history of other malignant neoplasm of skin: Secondary | ICD-10-CM | POA: Diagnosis not present

## 2020-04-24 DIAGNOSIS — L821 Other seborrheic keratosis: Secondary | ICD-10-CM

## 2020-04-24 DIAGNOSIS — I8393 Asymptomatic varicose veins of bilateral lower extremities: Secondary | ICD-10-CM

## 2020-04-24 DIAGNOSIS — D2239 Melanocytic nevi of other parts of face: Secondary | ICD-10-CM

## 2020-04-24 DIAGNOSIS — D2339 Other benign neoplasm of skin of other parts of face: Secondary | ICD-10-CM

## 2020-04-24 DIAGNOSIS — L853 Xerosis cutis: Secondary | ICD-10-CM | POA: Diagnosis not present

## 2020-04-24 DIAGNOSIS — Z1283 Encounter for screening for malignant neoplasm of skin: Secondary | ICD-10-CM | POA: Diagnosis not present

## 2020-04-24 DIAGNOSIS — D1800 Hemangioma unspecified site: Secondary | ICD-10-CM | POA: Diagnosis not present

## 2020-04-24 NOTE — Progress Notes (Signed)
   Follow-Up Visit   Subjective  Shannon Mckinney is a 86 y.o. female who presents for the following: Annual Exam.  Patient here for full body skin exam and skin cancer screening. She has a history of SCC and BCC. Patient not aware of anything new or changing.  She was last seen in clinic for poison ivy rash which was treated with prednisone.  The following portions of the chart were reviewed this encounter and updated as appropriate:      Review of Systems:  No other skin or systemic complaints except as noted in HPI or Assessment and Plan.  Objective  Well appearing patient in no apparent distress; mood and affect are within normal limits.  A full examination was performed including scalp, head, eyes, ears, nose, lips, neck, chest, axillae, abdomen, back, buttocks, bilateral upper extremities, bilateral lower extremities, hands, feet, fingers, toes, fingernails, and toenails. All findings within normal limits unless otherwise noted below.  Objective  Lower Leg: Severe xerosis both lower legs and feet   Objective  Lower Legs: Spider veins   Objective  right nasal tip: 1.58mm firm flesh papule   Assessment & Plan  Xerosis cutis Lower Leg  Recommend mild soap and moisturizing cream 1-2 times daily.  Eucerin samples given  Spider veins of both lower extremities Lower Legs  Benign, observe.     Fibrous papule of nose right nasal tip  Benign-appearing.  Observation.  Call clinic for new or changing lesions.  Recommend daily use of broad spectrum spf 30+ sunscreen to sun-exposed areas.     Lentigines - Scattered tan macules - Discussed due to sun exposure - Benign, observe - Call for any changes  Seborrheic Keratoses - Stuck-on, waxy, tan-brown papules and plaques  - Discussed benign etiology and prognosis. - Observe - Call for any changes  Hemangiomas - Red papules - Discussed benign nature - Observe - Call for any changes  Actinic Damage - diffuse scaly  erythematous macules with underlying dyspigmentation - Recommend daily broad spectrum sunscreen SPF 30+ to sun-exposed areas, reapply every 2 hours as needed.  - Call for new or changing lesions.  Skin cancer screening performed today.  History of Basal Cell Carcinoma of the Skin - No evidence of recurrence today at right nasolabial, top of left shoulder, mid nasal dorsum - Recommend regular full body skin exams - Recommend daily broad spectrum sunscreen SPF 30+ to sun-exposed areas, reapply every 2 hours as needed.  - Call if any new or changing lesions are noted between office visits  History of Squamous Cell Carcinoma of the Skin - No evidence of recurrence today at left upper lip, right temple above lateral brow - Recommend regular full body skin exams - Recommend daily broad spectrum sunscreen SPF 30+ to sun-exposed areas, reapply every 2 hours as needed.  - Call if any new or changing lesions are noted between office visits   Return in about 1 year (around 04/24/2021) for TBSE.  Graciella Belton, RMA, am acting as scribe for Brendolyn Patty, MD . Documentation: I have reviewed the above documentation for accuracy and completeness, and I agree with the above.  Brendolyn Patty MD

## 2020-04-24 NOTE — Patient Instructions (Addendum)
Seborrheic Keratosis  What causes seborrheic keratoses? Seborrheic keratoses are harmless, common skin growths that first appear during adult life.  As time goes by, more growths appear.  Some people may develop a large number of them.  Seborrheic keratoses appear on both covered and uncovered body parts.  They are not caused by sunlight.  The tendency to develop seborrheic keratoses can be inherited.  They vary in color from skin-colored to gray, brown, or even black.  They can be either smooth or have a rough, warty surface.   Seborrheic keratoses are superficial and look as if they were stuck on the skin.  Under the microscope this type of keratosis looks like layers upon layers of skin.  That is why at times the top layer may seem to fall off, but the rest of the growth remains and re-grows.    Treatment Seborrheic keratoses do not need to be treated, but can easily be removed in the office.  Seborrheic keratoses often cause symptoms when they rub on clothing or jewelry.  Lesions can be in the way of shaving.  If they become inflamed, they can cause itching, soreness, or burning.  Removal of a seborrheic keratosis can be accomplished by freezing, burning, or surgery. If any spot bleeds, scabs, or grows rapidly, please return to have it checked, as these can be an indication of a skin cancer.  Recommend mild soap and daily moisturizer for dry skin.

## 2020-05-02 ENCOUNTER — Encounter: Payer: Self-pay | Admitting: Ophthalmology

## 2020-05-02 ENCOUNTER — Other Ambulatory Visit: Payer: Self-pay

## 2020-05-07 ENCOUNTER — Other Ambulatory Visit: Payer: Self-pay

## 2020-05-07 ENCOUNTER — Other Ambulatory Visit
Admission: RE | Admit: 2020-05-07 | Discharge: 2020-05-07 | Disposition: A | Discharge status: Home / Self Care | Payer: Medicare Other | Source: Ambulatory Visit | Attending: Ophthalmology | Admitting: Ophthalmology

## 2020-05-07 DIAGNOSIS — Z20822 Contact with and (suspected) exposure to covid-19: Secondary | ICD-10-CM | POA: Diagnosis not present

## 2020-05-07 DIAGNOSIS — Z01812 Encounter for preprocedural laboratory examination: Secondary | ICD-10-CM | POA: Insufficient documentation

## 2020-05-07 LAB — SARS CORONAVIRUS 2 (TAT 6-24 HRS): SARS Coronavirus 2: NEGATIVE

## 2020-05-07 NOTE — Discharge Instructions (Signed)

## 2020-05-08 NOTE — Anesthesia Preprocedure Evaluation (Addendum)
Anesthesia Evaluation  Patient identified by MRN, date of birth, ID band Patient awake    Reviewed: Allergy & Precautions, NPO status , Patient's Chart, lab work & pertinent test results, reviewed documented beta blocker date and time   History of Anesthesia Complications Negative for: history of anesthetic complications  Airway Mallampati: II  TM Distance: >3 FB Neck ROM: Limited    Dental   Pulmonary    breath sounds clear to auscultation       Cardiovascular (-) angina(-) DOE  Rhythm:Regular Rate:Normal   HLD   Neuro/Psych    GI/Hepatic neg GERD  ,  Endo/Other  diabetesHypothyroidism   Renal/GU      Musculoskeletal  (+) Arthritis ,   Abdominal   Peds  Hematology   Anesthesia Other Findings Skin cancer  Reproductive/Obstetrics                            Anesthesia Physical Anesthesia Plan  ASA: II  Anesthesia Plan: MAC   Post-op Pain Management:    Induction: Intravenous  PONV Risk Score and Plan: 2 and TIVA, Midazolam and Treatment may vary due to age or medical condition  Airway Management Planned: Nasal Cannula  Additional Equipment:   Intra-op Plan:   Post-operative Plan:   Informed Consent: I have reviewed the patients History and Physical, chart, labs and discussed the procedure including the risks, benefits and alternatives for the proposed anesthesia with the patient or authorized representative who has indicated his/her understanding and acceptance.       Plan Discussed with: CRNA and Anesthesiologist  Anesthesia Plan Comments:         Anesthesia Quick Evaluation

## 2020-05-09 ENCOUNTER — Encounter: Payer: Self-pay | Admitting: Ophthalmology

## 2020-05-09 ENCOUNTER — Encounter
Admission: RE | Disposition: A | Discharge status: Home / Self Care | Payer: Self-pay | Source: Home / Self Care | Attending: Ophthalmology

## 2020-05-09 ENCOUNTER — Ambulatory Visit
Admission: RE | Admit: 2020-05-09 | Discharge: 2020-05-09 | Disposition: A | Discharge status: Home / Self Care | Payer: Medicare Other | Attending: Ophthalmology | Admitting: Ophthalmology

## 2020-05-09 ENCOUNTER — Other Ambulatory Visit: Payer: Self-pay

## 2020-05-09 ENCOUNTER — Ambulatory Visit: Payer: Medicare Other | Admitting: Anesthesiology

## 2020-05-09 DIAGNOSIS — H2511 Age-related nuclear cataract, right eye: Secondary | ICD-10-CM | POA: Insufficient documentation

## 2020-05-09 DIAGNOSIS — E1136 Type 2 diabetes mellitus with diabetic cataract: Secondary | ICD-10-CM | POA: Diagnosis not present

## 2020-05-09 DIAGNOSIS — Z79890 Hormone replacement therapy (postmenopausal): Secondary | ICD-10-CM | POA: Insufficient documentation

## 2020-05-09 DIAGNOSIS — E039 Hypothyroidism, unspecified: Secondary | ICD-10-CM | POA: Insufficient documentation

## 2020-05-09 DIAGNOSIS — M1990 Unspecified osteoarthritis, unspecified site: Secondary | ICD-10-CM | POA: Insufficient documentation

## 2020-05-09 DIAGNOSIS — H25811 Combined forms of age-related cataract, right eye: Secondary | ICD-10-CM | POA: Diagnosis not present

## 2020-05-09 HISTORY — PX: CATARACT EXTRACTION W/PHACO: SHX586

## 2020-05-09 SURGERY — PHACOEMULSIFICATION, CATARACT, WITH IOL INSERTION
Anesthesia: Monitor Anesthesia Care | Site: Eye | Laterality: Right

## 2020-05-09 MED ORDER — ACETAMINOPHEN 10 MG/ML IV SOLN: 1000.0000 mg | Freq: Once | INTRAVENOUS | Status: DC | PRN

## 2020-05-09 MED ORDER — LACTATED RINGERS IV SOLN: INTRAVENOUS | Status: DC

## 2020-05-09 MED ORDER — FENTANYL CITRATE (PF) 100 MCG/2ML IJ SOLN
INTRAMUSCULAR | Status: DC | PRN
Start: 2020-05-09 — End: 2020-05-09
  Administered 2020-05-09 (×2): 25 ug via INTRAVENOUS

## 2020-05-09 MED ORDER — ARMC OPHTHALMIC DILATING DROPS
1.0000 "application " | OPHTHALMIC | Status: DC | PRN
  Administered 2020-05-09 (×3): 1 via OPHTHALMIC

## 2020-05-09 MED ORDER — EPINEPHRINE PF 1 MG/ML IJ SOLN
INTRAOCULAR | Status: DC | PRN
  Administered 2020-05-09: 69 mL via OPHTHALMIC

## 2020-05-09 MED ORDER — ONDANSETRON HCL 4 MG/2ML IJ SOLN: 4.0000 mg | Freq: Once | INTRAMUSCULAR | Status: DC | PRN

## 2020-05-09 MED ORDER — TETRACAINE HCL 0.5 % OP SOLN
1.0000 [drp] | OPHTHALMIC | Status: DC | PRN
  Administered 2020-05-09 (×3): 1 [drp] via OPHTHALMIC

## 2020-05-09 MED ORDER — MOXIFLOXACIN HCL 0.5 % OP SOLN
OPHTHALMIC | Status: DC | PRN
  Administered 2020-05-09: 0.2 mL via OPHTHALMIC

## 2020-05-09 MED ORDER — MIDAZOLAM HCL 2 MG/2ML IJ SOLN
INTRAMUSCULAR | Status: DC | PRN
  Administered 2020-05-09: 1 mg via INTRAVENOUS

## 2020-05-09 MED ORDER — BRIMONIDINE TARTRATE-TIMOLOL 0.2-0.5 % OP SOLN
OPHTHALMIC | Status: DC | PRN
  Administered 2020-05-09: 1 [drp] via OPHTHALMIC

## 2020-05-09 MED ORDER — MOXIFLOXACIN HCL 0.5 % OP SOLN
1.0000 [drp] | OPHTHALMIC | Status: DC | PRN
  Administered 2020-05-09 (×3): 1 [drp] via OPHTHALMIC

## 2020-05-09 MED ORDER — LIDOCAINE HCL (PF) 2 % IJ SOLN
INTRAOCULAR | Status: DC | PRN
  Administered 2020-05-09: 1 mL

## 2020-05-09 MED ORDER — NA HYALUR & NA CHOND-NA HYALUR 0.4-0.35 ML IO KIT
PACK | INTRAOCULAR | Status: DC | PRN
  Administered 2020-05-09: 1 mL via INTRAOCULAR

## 2020-05-09 SURGICAL SUPPLY — 22 items
CANNULA ANT/CHMB 27G (MISCELLANEOUS) ×1 IMPLANT
CANNULA ANT/CHMB 27GA (MISCELLANEOUS) ×3 IMPLANT
GLOVE SURG LX 7.5 STRW (GLOVE) ×2
GLOVE SURG LX STRL 7.5 STRW (GLOVE) ×1 IMPLANT
GLOVE SURG TRIUMPH 8.0 PF LTX (GLOVE) ×3 IMPLANT
GOWN STRL REUS W/ TWL LRG LVL3 (GOWN DISPOSABLE) ×2 IMPLANT
GOWN STRL REUS W/TWL LRG LVL3 (GOWN DISPOSABLE) ×6
LENS IOL TECNIS EYHANCE 19.5 (Intraocular Lens) ×2 IMPLANT
MARKER SKIN DUAL TIP RULER LAB (MISCELLANEOUS) ×3 IMPLANT
NDL CAPSULORHEX 25GA (NEEDLE) ×1 IMPLANT
NDL FILTER BLUNT 18X1 1/2 (NEEDLE) ×2 IMPLANT
NEEDLE CAPSULORHEX 25GA (NEEDLE) ×3 IMPLANT
NEEDLE FILTER BLUNT 18X 1/2SAF (NEEDLE) ×4
NEEDLE FILTER BLUNT 18X1 1/2 (NEEDLE) ×2 IMPLANT
PACK CATARACT BRASINGTON (MISCELLANEOUS) ×3 IMPLANT
PACK EYE AFTER SURG (MISCELLANEOUS) ×3 IMPLANT
PACK OPTHALMIC (MISCELLANEOUS) ×3 IMPLANT
SOLUTION OPHTHALMIC SALT (MISCELLANEOUS) ×3 IMPLANT
SYR 3ML LL SCALE MARK (SYRINGE) ×6 IMPLANT
SYR TB 1ML LUER SLIP (SYRINGE) ×3 IMPLANT
WATER STERILE IRR 250ML POUR (IV SOLUTION) ×3 IMPLANT
WIPE NON LINTING 3.25X3.25 (MISCELLANEOUS) ×3 IMPLANT

## 2020-05-09 NOTE — H&P (Signed)

## 2020-05-09 NOTE — Op Note (Signed)
LOCATION:  [place]   PREOPERATIVE DIAGNOSIS:    Nuclear sclerotic cataract right eye. H25.11   POSTOPERATIVE DIAGNOSIS:  Nuclear sclerotic cataract right eye.     PROCEDURE:  Phacoemusification with posterior chamber intraocular lens placement of the right eye   ULTRASOUND TIME: Procedure(s): CATARACT EXTRACTION PHACO AND INTRAOCULAR LENS PLACEMENT (IOC) RIGHT DIABETIC 10.17  01:29.9  11.3% (Right)  LENS:   Implant Name Type Inv. Item Serial No. Manufacturer Lot No. LRB No. Used Action  LENS II EYHANCE 19.5 - [id number] Intraocular Lens LENS II EYHANCE 19.5 [id number] [id number]   Right 1 Implanted         SURGEON:  Wyonia Hough, MD   ANESTHESIA:  Topical with tetracaine drops and 2% Xylocaine jelly, augmented with 1% preservative-free intracameral lidocaine.    COMPLICATIONS:  None.   DESCRIPTION OF PROCEDURE:  The patient was identified in the holding room and transported to the operating room and placed in the supine position under the operating microscope.  The right eye was identified as the operative eye and it was prepped and draped in the usual sterile ophthalmic fashion.   A 1 millimeter clear-corneal paracentesis was made at the 12:00 position.  0.5 ml of preservative-free 1% lidocaine was injected into the anterior chamber. The anterior chamber was filled with Viscoat viscoelastic.  A 2.4 millimeter keratome was used to make a near-clear corneal incision at the 9:00 position.  A curvilinear capsulorrhexis was made with a cystotome and capsulorrhexis forceps.  Balanced salt solution was used to hydrodissect and hydrodelineate the nucleus.   Phacoemulsification was then used in stop and chop fashion to remove the lens nucleus and epinucleus.  The remaining cortex was then removed using the irrigation and aspiration handpiece. Provisc was then placed into the capsular bag to distend it for lens placement.  A lens was then injected into the capsular bag.  The  remaining viscoelastic was aspirated.   Wounds were hydrated with balanced salt solution.  The anterior chamber was inflated to a physiologic pressure with balanced salt solution.  No wound leaks were noted. Vigamox 0.2 ml of a 1mg  per ml solution was injected into the anterior chamber for a dose of 0.2 mg of intracameral antibiotic at the completion of the case.   Timolol and Brimonidine drops were applied to the eye.  The patient was taken to the recovery room in stable condition without complications of anesthesia or surgery.   Sofya Moustafa 05/09/2020, 11:28 AM

## 2020-05-09 NOTE — Anesthesia Postprocedure Evaluation (Signed)
Anesthesia Post Note  Patient: Shannon Mckinney  Procedure(s) Performed: CATARACT EXTRACTION PHACO AND INTRAOCULAR LENS PLACEMENT (IOC) RIGHT DIABETIC 10.17  01:29.9  11.3% (Right Eye)     Patient location during evaluation: PACU Anesthesia Type: MAC Level of consciousness: awake and alert Pain management: pain level controlled Vital Signs Assessment: post-procedure vital signs reviewed and stable Respiratory status: spontaneous breathing, nonlabored ventilation, respiratory function stable and patient connected to nasal cannula oxygen Cardiovascular status: stable and blood pressure returned to baseline Postop Assessment: no apparent nausea or vomiting Anesthetic complications: no   No complications documented.  Jeniyah Menor A  Leylanie Woodmansee

## 2020-05-09 NOTE — Transfer of Care (Signed)
Immediate Anesthesia Transfer of Care Note  Patient: Shannon Mckinney  Procedure(s) Performed: CATARACT EXTRACTION PHACO AND INTRAOCULAR LENS PLACEMENT (IOC) RIGHT DIABETIC 10.17  01:29.9  11.3% (Right Eye)  Patient Location: PACU  Anesthesia Type: MAC  Level of Consciousness: awake, alert  and patient cooperative  Airway and Oxygen Therapy: Patient Spontanous Breathing and Patient connected to supplemental oxygen  Post-op Assessment: Post-op Vital signs reviewed, Patient's Cardiovascular Status Stable, Respiratory Function Stable, Patent Airway and No signs of Nausea or vomiting  Post-op Vital Signs: Reviewed and stable  Complications: No complications documented.

## 2020-05-09 NOTE — Anesthesia Procedure Notes (Signed)
Date/Time: 05/09/2020 11:08 AM Performed by: Dionne Bucy, CRNA Pre-anesthesia Checklist: Patient identified, Emergency Drugs available, Suction available, Patient being monitored and Timeout performed Oxygen Delivery Method: Nasal cannula Placement Confirmation: positive ETCO2

## 2020-05-10 ENCOUNTER — Encounter: Payer: Self-pay | Admitting: Ophthalmology

## 2020-05-23 DIAGNOSIS — E039 Hypothyroidism, unspecified: Secondary | ICD-10-CM | POA: Diagnosis not present

## 2020-05-23 DIAGNOSIS — H2512 Age-related nuclear cataract, left eye: Secondary | ICD-10-CM | POA: Diagnosis not present

## 2020-05-24 ENCOUNTER — Other Ambulatory Visit: Payer: Self-pay

## 2020-05-24 ENCOUNTER — Encounter: Payer: Self-pay | Admitting: Ophthalmology

## 2020-05-28 ENCOUNTER — Other Ambulatory Visit: Payer: Self-pay

## 2020-05-28 ENCOUNTER — Other Ambulatory Visit
Admission: RE | Admit: 2020-05-28 | Discharge: 2020-05-28 | Disposition: A | Discharge status: Home / Self Care | Payer: Medicare Other | Source: Ambulatory Visit | Attending: Ophthalmology | Admitting: Ophthalmology

## 2020-05-28 DIAGNOSIS — Z01812 Encounter for preprocedural laboratory examination: Secondary | ICD-10-CM | POA: Insufficient documentation

## 2020-05-28 DIAGNOSIS — Z20822 Contact with and (suspected) exposure to covid-19: Secondary | ICD-10-CM | POA: Diagnosis not present

## 2020-05-28 LAB — SARS CORONAVIRUS 2 (TAT 6-24 HRS): SARS Coronavirus 2: NEGATIVE

## 2020-05-29 NOTE — Discharge Instructions (Signed)

## 2020-05-30 ENCOUNTER — Ambulatory Visit: Payer: Medicare Other | Admitting: Anesthesiology

## 2020-05-30 ENCOUNTER — Ambulatory Visit
Admission: RE | Admit: 2020-05-30 | Discharge: 2020-05-30 | Disposition: A | Discharge status: Home / Self Care | Payer: Medicare Other | Attending: Ophthalmology | Admitting: Ophthalmology

## 2020-05-30 ENCOUNTER — Other Ambulatory Visit: Payer: Self-pay

## 2020-05-30 ENCOUNTER — Encounter: Payer: Self-pay | Admitting: Ophthalmology

## 2020-05-30 ENCOUNTER — Encounter
Admission: RE | Disposition: A | Discharge status: Home / Self Care | Payer: Self-pay | Source: Home / Self Care | Attending: Ophthalmology

## 2020-05-30 DIAGNOSIS — E1136 Type 2 diabetes mellitus with diabetic cataract: Secondary | ICD-10-CM | POA: Insufficient documentation

## 2020-05-30 DIAGNOSIS — H2512 Age-related nuclear cataract, left eye: Secondary | ICD-10-CM | POA: Diagnosis not present

## 2020-05-30 DIAGNOSIS — Z880 Allergy status to penicillin: Secondary | ICD-10-CM | POA: Insufficient documentation

## 2020-05-30 DIAGNOSIS — H25812 Combined forms of age-related cataract, left eye: Secondary | ICD-10-CM | POA: Diagnosis not present

## 2020-05-30 HISTORY — PX: CATARACT EXTRACTION W/PHACO: SHX586

## 2020-05-30 SURGERY — PHACOEMULSIFICATION, CATARACT, WITH IOL INSERTION
Anesthesia: Monitor Anesthesia Care | Site: Eye | Laterality: Left

## 2020-05-30 MED ORDER — NA HYALUR & NA CHOND-NA HYALUR 0.4-0.35 ML IO KIT
PACK | INTRAOCULAR | Status: DC | PRN
  Administered 2020-05-30: 1 mL via INTRAOCULAR

## 2020-05-30 MED ORDER — LACTATED RINGERS IV SOLN: INTRAVENOUS | Status: DC

## 2020-05-30 MED ORDER — FENTANYL CITRATE (PF) 100 MCG/2ML IJ SOLN
INTRAMUSCULAR | Status: DC | PRN
  Administered 2020-05-30: 50 ug via INTRAVENOUS

## 2020-05-30 MED ORDER — TETRACAINE HCL 0.5 % OP SOLN
1.0000 [drp] | OPHTHALMIC | Status: DC | PRN
  Administered 2020-05-30 (×3): 1 [drp] via OPHTHALMIC

## 2020-05-30 MED ORDER — MOXIFLOXACIN HCL 0.5 % OP SOLN
OPHTHALMIC | Status: DC | PRN
  Administered 2020-05-30: 0.2 mL via OPHTHALMIC

## 2020-05-30 MED ORDER — EPINEPHRINE PF 1 MG/ML IJ SOLN
INTRAOCULAR | Status: DC | PRN
  Administered 2020-05-30: 92 mL via OPHTHALMIC

## 2020-05-30 MED ORDER — BRIMONIDINE TARTRATE-TIMOLOL 0.2-0.5 % OP SOLN
OPHTHALMIC | Status: DC | PRN
  Administered 2020-05-30: 1 [drp] via OPHTHALMIC

## 2020-05-30 MED ORDER — MOXIFLOXACIN HCL 0.5 % OP SOLN
1.0000 [drp] | OPHTHALMIC | Status: DC | PRN
  Administered 2020-05-30 (×3): 1 [drp] via OPHTHALMIC

## 2020-05-30 MED ORDER — ARMC OPHTHALMIC DILATING DROPS
1.0000 "application " | OPHTHALMIC | Status: DC | PRN
  Administered 2020-05-30 (×3): 1 via OPHTHALMIC

## 2020-05-30 MED ORDER — LIDOCAINE HCL (PF) 2 % IJ SOLN
INTRAOCULAR | Status: DC | PRN
  Administered 2020-05-30: 1 mL

## 2020-05-30 MED ORDER — MIDAZOLAM HCL 2 MG/2ML IJ SOLN
INTRAMUSCULAR | Status: DC | PRN
  Administered 2020-05-30: .5 mg via INTRAVENOUS

## 2020-05-30 SURGICAL SUPPLY — 28 items
CANNULA ANT/CHMB 27G (MISCELLANEOUS) ×1 IMPLANT
CANNULA ANT/CHMB 27GA (MISCELLANEOUS) ×3 IMPLANT
GLOVE SURG LX 7.5 STRW (GLOVE) ×4
GLOVE SURG LX STRL 7.5 STRW (GLOVE) ×1 IMPLANT
GLOVE SURG TRIUMPH 8.0 PF LTX (GLOVE) ×3 IMPLANT
GOWN STRL REUS W/ TWL LRG LVL3 (GOWN DISPOSABLE) ×2 IMPLANT
GOWN STRL REUS W/TWL LRG LVL3 (GOWN DISPOSABLE) ×6
LENS IOL TECNIS EYHANCE 20.0 (Intraocular Lens) ×2 IMPLANT
MARKER SKIN DUAL TIP RULER LAB (MISCELLANEOUS) ×3 IMPLANT
NDL CAPSULORHEX 25GA (NEEDLE) ×1 IMPLANT
NDL FILTER BLUNT 18X1 1/2 (NEEDLE) ×2 IMPLANT
NDL RETROBULBAR .5 NSTRL (NEEDLE) IMPLANT
NEEDLE CAPSULORHEX 25GA (NEEDLE) ×3 IMPLANT
NEEDLE FILTER BLUNT 18X 1/2SAF (NEEDLE) ×4
NEEDLE FILTER BLUNT 18X1 1/2 (NEEDLE) ×2 IMPLANT
PACK CATARACT BRASINGTON (MISCELLANEOUS) ×3 IMPLANT
PACK EYE AFTER SURG (MISCELLANEOUS) ×3 IMPLANT
PACK OPTHALMIC (MISCELLANEOUS) ×3 IMPLANT
RING MALYGIN 7.0 (MISCELLANEOUS) IMPLANT
SOLUTION OPHTHALMIC SALT (MISCELLANEOUS) ×3 IMPLANT
SUT ETHILON 10-0 CS-B-6CS-B-6 (SUTURE)
SUT VICRYL  9 0 (SUTURE)
SUT VICRYL 9 0 (SUTURE) IMPLANT
SUTURE EHLN 10-0 CS-B-6CS-B-6 (SUTURE) IMPLANT
SYR 3ML LL SCALE MARK (SYRINGE) ×6 IMPLANT
SYR TB 1ML LUER SLIP (SYRINGE) ×3 IMPLANT
WATER STERILE IRR 250ML POUR (IV SOLUTION) ×3 IMPLANT
WIPE NON LINTING 3.25X3.25 (MISCELLANEOUS) ×3 IMPLANT

## 2020-05-30 NOTE — Anesthesia Postprocedure Evaluation (Signed)
Anesthesia Post Note  Patient: Shannon Mckinney  Procedure(s) Performed: CATARACT EXTRACTION PHACO AND INTRAOCULAR LENS PLACEMENT (IOC) LEFT DIABETIC (Left Eye)     Patient location during evaluation: PACU Anesthesia Type: MAC Level of consciousness: awake and alert Pain management: pain level controlled Vital Signs Assessment: post-procedure vital signs reviewed and stable Respiratory status: spontaneous breathing Cardiovascular status: stable Anesthetic complications: no   No complications documented.  Gillian Scarce

## 2020-05-30 NOTE — Op Note (Signed)
OPERATIVE NOTE  OVETA IDRIS [id number] 05/30/2020   PREOPERATIVE DIAGNOSIS:  Nuclear sclerotic cataract left eye. H25.12   POSTOPERATIVE DIAGNOSIS:    Nuclear sclerotic cataract left eye.     PROCEDURE:  Phacoemusification with posterior chamber intraocular lens placement of the left eye  Ultrasound time: Procedure(s) with comments: CATARACT EXTRACTION PHACO AND INTRAOCULAR LENS PLACEMENT (IOC) LEFT DIABETIC (Left) - 12.20 1:24.7 14.4%  LENS:   Implant Name Type Inv. Item Serial No. Manufacturer Lot No. LRB No. Used Action  LENS IOL TECNIS EYHANCE 20.0 - [id number] Intraocular Lens LENS IOL TECNIS EYHANCE 20.0 [id number] JOHNSON   Left 1 Implanted      SURGEON:  Wyonia Hough, MD   ANESTHESIA:  Topical with tetracaine drops and 2% Xylocaine jelly, augmented with 1% preservative-free intracameral lidocaine.    COMPLICATIONS:  None.   DESCRIPTION OF PROCEDURE:  The patient was identified in the holding room and transported to the operating room and placed in the supine position under the operating microscope.  The left eye was identified as the operative eye and it was prepped and draped in the usual sterile ophthalmic fashion.   A 1 millimeter clear-corneal paracentesis was made at the 1:30 position.  0.5 ml of preservative-free 1% lidocaine was injected into the anterior chamber.  The anterior chamber was filled with Viscoat viscoelastic.  A 2.4 millimeter keratome was used to make a near-clear corneal incision at the 10:30 position.  .  A curvilinear capsulorrhexis was made with a cystotome and capsulorrhexis forceps.  Balanced salt solution was used to hydrodissect and hydrodelineate the nucleus.   Phacoemulsification was then used in stop and chop fashion to remove the lens nucleus and epinucleus.  The remaining cortex was then removed using the irrigation and aspiration handpiece. Provisc was then placed into the capsular bag to distend it for lens placement.  A lens was  then injected into the capsular bag.  The remaining viscoelastic was aspirated.   Wounds were hydrated with balanced salt solution.  The anterior chamber was inflated to a physiologic pressure with balanced salt solution.  No wound leaks were noted. Vigamox 0.2 ml of a 1mg  per ml solution was injected into the anterior chamber for a dose of 0.2 mg of intracameral antibiotic at the completion of the case.   Timolol and Brimonidine drops were applied to the eye.  The patient was taken to the recovery room in stable condition without complications of anesthesia or surgery.  Shermon Bozzi 05/30/2020, 11:12 AM

## 2020-05-30 NOTE — H&P (Signed)

## 2020-05-30 NOTE — Anesthesia Preprocedure Evaluation (Signed)
Anesthesia Evaluation  Patient identified by MRN, date of birth, ID band Patient awake    Reviewed: Allergy & Precautions, H&P , NPO status , Patient's Chart, lab work & pertinent test results  Airway Mallampati: II  TM Distance: >3 FB Neck ROM: full    Dental no notable dental hx.    Pulmonary neg pulmonary ROS,    Pulmonary exam normal        Cardiovascular Normal cardiovascular exam Rhythm:regular Rate:Normal     Neuro/Psych negative neurological ROS     GI/Hepatic negative GI ROS, Neg liver ROS,   Endo/Other  diabetesHypothyroidism   Renal/GU negative Renal ROS  negative genitourinary   Musculoskeletal   Abdominal   Peds  Hematology negative hematology ROS (+)   Anesthesia Other Findings   Reproductive/Obstetrics                             Anesthesia Physical Anesthesia Plan  ASA: II  Anesthesia Plan: MAC   Post-op Pain Management:    Induction:   PONV Risk Score and Plan: 2 and Treatment may vary due to age or medical condition  Airway Management Planned:   Additional Equipment:   Intra-op Plan:   Post-operative Plan:   Informed Consent: I have reviewed the patients History and Physical, chart, labs and discussed the procedure including the risks, benefits and alternatives for the proposed anesthesia with the patient or authorized representative who has indicated his/her understanding and acceptance.       Plan Discussed with:   Anesthesia Plan Comments:         Anesthesia Quick Evaluation

## 2020-05-30 NOTE — Transfer of Care (Signed)
Immediate Anesthesia Transfer of Care Note  Patient: Shannon Mckinney  Procedure(s) Performed: CATARACT EXTRACTION PHACO AND INTRAOCULAR LENS PLACEMENT (IOC) LEFT DIABETIC (Left Eye)  Patient Location: PACU  Anesthesia Type: MAC  Level of Consciousness: awake, alert  and patient cooperative  Airway and Oxygen Therapy: Patient Spontanous Breathing and Patient connected to supplemental oxygen  Post-op Assessment: Post-op Vital signs reviewed, Patient's Cardiovascular Status Stable, Respiratory Function Stable, Patent Airway and No signs of Nausea or vomiting  Post-op Vital Signs: Reviewed and stable  Complications: No complications documented.

## 2020-05-30 NOTE — Anesthesia Procedure Notes (Signed)
Procedure Name: MAC Performed by: Adeana Grilliot, CRNA Pre-anesthesia Checklist: Patient identified, Emergency Drugs available, Suction available, Timeout performed and Patient being monitored Patient Re-evaluated:Patient Re-evaluated prior to induction Oxygen Delivery Method: Nasal cannula Placement Confirmation: positive ETCO2       

## 2020-05-31 ENCOUNTER — Encounter: Payer: Self-pay | Admitting: Ophthalmology

## 2020-06-08 DIAGNOSIS — Z23 Encounter for immunization: Secondary | ICD-10-CM | POA: Diagnosis not present

## 2020-06-27 NOTE — Progress Notes (Signed)
Subjective:   Shannon Mckinney is a 86 y.o. female who presents for Medicare Annual (Subsequent) preventive examination.  I connected with Shannon Mckinney today by telephone and verified that I am speaking with the correct person using two identifiers. Location patient: home Location provider: work Persons participating in the virtual visit: patient, provider.   I discussed the limitations, risks, security and privacy concerns of performing an evaluation and management service by telephone and the availability of in person appointments. I also discussed with the patient that there may be a patient responsible charge related to this service. The patient expressed understanding and verbally consented to this telephonic visit.    Interactive audio and video telecommunications were attempted between this provider and patient, however failed, due to patient having technical difficulties OR patient did not have access to video capability.  We continued and completed visit with audio only.   Review of Systems    N/A  Cardiac Risk Factors include: advanced age (>69men, >26 women);diabetes mellitus     Objective:    There were no vitals filed for this visit. There is no height or weight on file to calculate BMI.  Advanced Directives 06/28/2020 05/30/2020 05/09/2020 06/27/2019 06/21/2018 06/11/2017 06/06/2016  Does Patient Have a Medical Advance Directive? Yes Yes Yes Yes Yes Yes Yes  Type of Advance Directive Living will [place];Living will [employer];Living will [place];Living will Living will;Healthcare Power of Attorney Living will [place];Living will  Does patient want to make changes to medical advance directive? - No - Patient declined No - Patient declined - - No - Patient declined -  Copy of [place] in Chart? - No - copy requested No - copy requested No - copy requested No - copy requested - No -  copy requested    Current Medications (verified) Outpatient Encounter Medications as of 06/28/2020  Medication Sig  . Apoaequorin (PREVAGEN PO) Take by mouth daily.  Marland Kitchen levothyroxine (SYNTHROID) 88 MCG tablet Take 1 tablet (88 mcg total) by mouth daily before breakfast.   No facility-administered encounter medications on file as of 06/28/2020.    Allergies (verified) Mysoline [primidone] and Penicillins   History: Past Medical History:  Diagnosis Date  . Allergy   . Arthritis   . Benign head tremor   . Diabetes mellitus ([place])    diet controlled  . Fibrocystic breast disease   . Heart murmur   . History of abnormal Pap smear   . Hx of basal cell carcinoma 11/06/2006   R nasolabial, excised 11/24/2006  . Hx of basal cell carcinoma 04/10/2009   Mid nasal dorsum  . Hx of basal cell carcinoma 01/26/2017   Top of L shoulder  . Hypercholesterolemia   . Hyperthyroidism    s/p ablation  . Squamous cell carcinoma of skin 01/21/2016   R temple above lat brow  . Squamous cell carcinoma of skin 06/23/2016   L upper lip, excised 08/04/2016   Past Surgical History:  Procedure Laterality Date  . BREAST BIOPSY  70's   benign  . CARPAL TUNNEL RELEASE     right hand  . CATARACT EXTRACTION W/PHACO Right 05/09/2020   Procedure: CATARACT EXTRACTION PHACO AND INTRAOCULAR LENS PLACEMENT (IOC) RIGHT DIABETIC 10.17  01:29.9  11.3%;  Surgeon: Leandrew Koyanagi, MD;  Location: [facility];  Service: Ophthalmology;  Laterality: Right;  . CATARACT EXTRACTION W/PHACO Left 05/30/2020   Procedure: CATARACT EXTRACTION PHACO AND INTRAOCULAR  LENS PLACEMENT (IOC) LEFT DIABETIC;  Surgeon: Leandrew Koyanagi, MD;  Location: [facility];  Service: Ophthalmology;  Laterality: Left;  12.20 1:24.7 14.4%  . KNEE ARTHROSCOPY Left 09/12/2015   Procedure: ARTHROSCOPY LEFT KNEE, PARTIAL MEDIAL MENISECTOMY, PARTIAL LATERAL MENISECTOMY, CHONDROPLASTY MEDIAL AND LATERAL;  Surgeon: Dereck Leep,  MD;  Location: [facility];  Service: Orthopedics;  Laterality: Left;  . SHOULDER ARTHROSCOPY    . TONSILLECTOMY     Family History  Problem Relation Age of Onset  . Heart disease Father        myocardial infarction - died 58  . Diabetes Father   . Heart disease Mother        myocardial infarction  . Heart disease Brother        x2  . Diabetes Brother   . Diabetes Brother   . Diabetes Sister   . Breast cancer Neg Hx   . Colon cancer Neg Hx    Social History   Socioeconomic History  . Marital status: Widowed    Spouse name: Not on file  . Number of children: 2  . Years of education: Not on file  . Highest education level: Bachelor's degree (e.g., BA, AB, BS)  Occupational History  . Occupation: retired Arboriculturist   Tobacco Use  . Smoking status: Never Smoker  . Smokeless tobacco: Never Used  Vaping Use  . Vaping Use: Never used  Substance and Sexual Activity  . Alcohol use: Yes    Alcohol/week: 0.0 standard drinks    Comment: rare glass of wine  . Drug use: No  . Sexual activity: Not Currently  Other Topics Concern  . Not on file  Social History Narrative  . Not on file   Social Determinants of Health   Financial Resource Strain: Low Risk   . Difficulty of Paying Living Expenses: Not hard at all  Food Insecurity: No Food Insecurity  . Worried About Charity fundraiser in the Last Year: Never true  . Ran Out of Food in the Last Year: Never true  Transportation Needs: No Transportation Needs  . Lack of Transportation (Medical): No  . Lack of Transportation (Non-Medical): No  Physical Activity: Inactive  . Days of Exercise per Week: 0 days  . Minutes of Exercise per Session: 0 min  Stress: No Stress Concern Present  . Feeling of Stress : Not at all  Social Connections: Socially Isolated  . Frequency of Communication with Friends and Family: More than three times a week  . Frequency of Social Gatherings with Friends and Family: More than three times a week    . Attends Religious Services: Never  . Active Member of Clubs or Organizations: No  . Attends Archivist Meetings: Never  . Marital Status: Widowed    Tobacco Counseling Counseling given: Not Answered   Clinical Intake:  Pre-visit preparation completed: Yes  Pain : No/denies pain     Nutritional Risks: None Diabetes: Yes  How often do you need to have someone help you when you read instructions, pamphlets, or other written materials from your doctor or pharmacy?: 1 - Never  Diabetic? Yes  Nutrition Risk Assessment:  Has the patient had any N/V/D within the last 2 months?  No  Does the patient have any non-healing wounds?  No  Has the patient had any unintentional weight loss or weight gain?  No   Diabetes:  Is the patient diabetic?  Yes  If diabetic, was a CBG obtained today?  No  Did the patient bring in their glucometer from home?  No  How often do you monitor your CBG's? Does not check BS.   Financial Strains and Diabetes Management:  Are you having any financial strains with the device, your supplies or your medication? No .  Does the patient want to be seen by Chronic Care Management for management of their diabetes?  No  Would the patient like to be referred to a Nutritionist or for Diabetic Management?  No   Diabetic Exams:  Diabetic Eye Exam: Completed 02/21/20 Diabetic Foot Exam: Completed 12/08/19   Interpreter Needed?: No  Information entered by :: [facility], LPN   Activities of Daily Living In your present state of health, do you have any difficulty performing the following activities: 06/28/2020 05/30/2020  Hearing? N N  Vision? N N  Difficulty concentrating or making decisions? N N  Walking or climbing stairs? N N  Dressing or bathing? N N  Doing errands, shopping? N -  Preparing Food and eating ? N -  Using the Toilet? N -  In the past six months, have you accidently leaked urine? N -  Do you have problems with loss of bowel  control? N -  Managing your Medications? N -  Managing your Finances? N -  Housekeeping or managing your Housekeeping? N -  Some recent data might be hidden    Patient Care Team: Virginia Crews, MD as PCP - General (Family Medicine) Leandrew Koyanagi, MD as Referring Physician (Ophthalmology) Brendolyn Patty, MD (Dermatology) Concepcion Elk., MD (Dentistry)  Indicate any recent Medical Services you may have received from other than [facility] providers in the past year (date may be approximate).     Assessment:   This is a routine wellness examination for [place].  Hearing/Vision screen No exam data present  Dietary issues and exercise activities discussed: Current Exercise Habits: The patient does not participate in regular exercise at present, Exercise limited by: None identified  Goals    . DIET - INCREASE WATER INTAKE     Recommend to drink at least 6-8 8oz glasses of water per day.     Marland Kitchen Healthy Lifestyle     Maintain drinking plenty of fluids, exercise regimen and low carb foods.      Depression Screen PHQ 2/9 Scores 06/28/2020 06/27/2019 06/21/2018 03/30/2018 06/11/2017 09/24/2016 06/06/2016  PHQ - 2 Score 1 1 1  0 0 0 0  PHQ- 9 Score - - - 0 0 - -    Fall Risk Fall Risk  06/28/2020 06/27/2019 01/25/2019 06/21/2018 03/30/2018  Falls in the past year? 0 0 0 1 Yes  Number falls in past yr: 0 0 - 0 1  Injury with Fall? 0 0 - 0 No  Follow up - - - Falls prevention discussed -    Any stairs in or around the home? Yes  If so, are there any without handrails? No  Home free of loose throw rugs in walkways, pet beds, electrical cords, etc? Yes  Adequate lighting in your home to reduce risk of falls? Yes   ASSISTIVE DEVICES UTILIZED TO PREVENT FALLS:  Life alert? Yes  Use of a cane, walker or w/c? No  Grab bars in the bathroom? No  Shower chair or bench in shower? Yes  Elevated toilet seat or a handicapped toilet? Yes    Cognitive Function: Declined today.  MMSE  - Mini Mental State Exam 06/11/2017 06/06/2016  Orientation to time 5 5  Orientation to  Place 5 5  Registration 3 3  Attention/ Calculation 5 5  Recall 1 3  Language- name 2 objects 2 2  Language- repeat 1 1  Language- follow 3 step command 3 3  Language- read & follow direction 1 1  Write a sentence 1 1  Copy design 1 1  Total score 28 30     6CIT Screen 06/27/2019 06/21/2018 06/06/2016  What Year? 0 points 0 points 0 points  What month? 0 points 0 points 0 points  What time? 0 points 0 points 0 points  Count back from 20 0 points 0 points 0 points  Months in reverse 0 points 0 points 0 points  Repeat phrase 0 points 0 points 0 points  Total Score 0 0 0    Immunizations Immunization History  Administered Date(s) Administered  . Influenza Split 04/27/2012, 05/16/2013, 05/11/2014  . Influenza, High Dose Seasonal PF 03/30/2018  . Influenza,inj,Quad PF,6+ Mos 06/11/2017  . Influenza-Unspecified 04/24/2014, 06/04/2015, 06/24/2016, 05/25/2019  . Pneumococcal Conjugate-13 07/04/2013  . Pneumococcal Polysaccharide-23 09/12/2016    TDAP status: Due, Education has been provided regarding the importance of this vaccine. Advised may receive this vaccine at local pharmacy or Health Dept. Aware to provide a copy of the vaccination record if obtained from local pharmacy or Health Dept. Verbalized acceptance and understanding. Flu Vaccine status: Up to date Pneumococcal vaccine status: Up to date Covid-19 vaccine status: Completed vaccines  Qualifies for Shingles Vaccine? Yes   Zostavax completed No   Shingrix Completed?: No.    Education has been provided regarding the importance of this vaccine. Patient has been advised to call insurance company to determine out of pocket expense if they have not yet received this vaccine. Advised may also receive vaccine at local pharmacy or Health Dept. Verbalized acceptance and understanding.  Screening Tests Health Maintenance  Topic Date Due    . COVID-19 Vaccine (1) Never done  . INFLUENZA VACCINE  02/26/2020  . HEMOGLOBIN A1C  06/09/2020  . TETANUS/TDAP  06/28/2021 (Originally 11/29/1947)  . FOOT EXAM  12/07/2020  . URINE MICROALBUMIN  12/07/2020  . OPHTHALMOLOGY EXAM  02/20/2021  . DEXA SCAN  08/20/2023  . PNA vac Low Risk Adult  Completed  . MAMMOGRAM  Discontinued    Health Maintenance  Health Maintenance Due  Topic Date Due  . COVID-19 Vaccine (1) Never done  . INFLUENZA VACCINE  02/26/2020  . HEMOGLOBIN A1C  06/09/2020    Colorectal cancer screening: No longer required.  Mammogram status: No longer required.  Bone Density status: Completed 08/19/18. Results reflect: Bone density results: OSTEOPENIA. Repeat every 5 years.  Lung Cancer Screening: (Low Dose CT Chest recommended if Age 61-80 years, 30 pack-year currently smoking OR have quit w/in 15years.) does not qualify.    Additional Screening:  Vision Screening: Recommended annual ophthalmology exams for early detection of glaucoma and other disorders of the eye. Is the patient up to date with their annual eye exam?  Yes  Who is the provider or what is the name of the office in which the patient attends annual eye exams? Dr Wallace Going @ [place] If pt is not established with a provider, would they like to be referred to a provider to establish care? No .   Dental Screening: Recommended annual dental exams for proper oral hygiene  Community Resource Referral / Chronic Care Management: CRR required this visit?  No   CCM required this visit?  No      Plan:  I have personally reviewed and noted the following in the patient's chart:   . Medical and social history . Use of alcohol, tobacco or illicit drugs  . Current medications and supplements . Functional ability and status . Nutritional status . Physical activity . Advanced directives . List of other physicians . Hospitalizations, surgeries, and ER visits in previous 12  months . Vitals . Screenings to include cognitive, depression, and falls . Referrals and appointments  In addition, I have reviewed and discussed with patient certain preventive protocols, quality metrics, and best practice recommendations. A written personalized care plan for preventive services as well as general preventive health recommendations were provided to patient.     Eulon Allnutt [place], [place]   [id number]   Nurse Notes: Pt needs a Hgb A1c check today. Pt states she has received her Covid vaccines and her flu shot this year. Pt to bring dates received to in office apt this afternoon to up date chart.

## 2020-06-28 ENCOUNTER — Encounter: Payer: Self-pay | Admitting: Family Medicine

## 2020-06-28 ENCOUNTER — Other Ambulatory Visit: Payer: Self-pay

## 2020-06-28 ENCOUNTER — Ambulatory Visit (INDEPENDENT_AMBULATORY_CARE_PROVIDER_SITE_OTHER): Payer: Medicare Other

## 2020-06-28 ENCOUNTER — Ambulatory Visit (INDEPENDENT_AMBULATORY_CARE_PROVIDER_SITE_OTHER): Payer: Medicare Other | Admitting: Family Medicine

## 2020-06-28 VITALS — BP 116/60 | HR 74 | Temp 98.3°F | Resp 16 | Ht 62.0 in | Wt 114.9 lb

## 2020-06-28 DIAGNOSIS — E559 Vitamin D deficiency, unspecified: Secondary | ICD-10-CM

## 2020-06-28 DIAGNOSIS — E890 Postprocedural hypothyroidism: Secondary | ICD-10-CM

## 2020-06-28 DIAGNOSIS — E7800 Pure hypercholesterolemia, unspecified: Secondary | ICD-10-CM | POA: Diagnosis not present

## 2020-06-28 DIAGNOSIS — Z0000 Encounter for general adult medical examination without abnormal findings: Secondary | ICD-10-CM

## 2020-06-28 DIAGNOSIS — E119 Type 2 diabetes mellitus without complications: Secondary | ICD-10-CM | POA: Diagnosis not present

## 2020-06-28 NOTE — Assessment & Plan Note (Addendum)
Previously well controlled Currently not on any medications Recheck A1c Recheck foot exam F/u in 6 months

## 2020-06-28 NOTE — Patient Instructions (Signed)
Preventive Care 38 Years and Older, Female Preventive care refers to lifestyle choices and visits with your health care provider that can promote health and wellness. This includes:  A yearly physical exam. This is also called an annual well check.  Regular dental and eye exams.  Immunizations.  Screening for certain conditions.  Healthy lifestyle choices, such as diet and exercise. What can I expect for my preventive care visit? Physical exam Your health care provider will check:  Height and weight. These may be used to calculate body mass index (BMI), which is a measurement that tells if you are at a healthy weight.  Heart rate and blood pressure.  Your skin for abnormal spots. Counseling Your health care provider may ask you questions about:  Alcohol, tobacco, and drug use.  Emotional well-being.  Home and relationship well-being.  Sexual activity.  Eating habits.  History of falls.  Memory and ability to understand (cognition).  Work and work Statistician.  Pregnancy and menstrual history. What immunizations do I need?  Influenza (flu) vaccine  This is recommended every year. Tetanus, diphtheria, and pertussis (Tdap) vaccine  You may need a Td booster every 10 years. Varicella (chickenpox) vaccine  You may need this vaccine if you have not already been vaccinated. Zoster (shingles) vaccine  You may need this after age 33. Pneumococcal conjugate (PCV13) vaccine  One dose is recommended after age 33. Pneumococcal polysaccharide (PPSV23) vaccine  One dose is recommended after age 72. Measles, mumps, and rubella (MMR) vaccine  You may need at least one dose of MMR if you were born in 1957 or later. You may also need a second dose. Meningococcal conjugate (MenACWY) vaccine  You may need this if you have certain conditions. Hepatitis A vaccine  You may need this if you have certain conditions or if you travel or work in places where you may be exposed  to hepatitis A. Hepatitis B vaccine  You may need this if you have certain conditions or if you travel or work in places where you may be exposed to hepatitis B. Haemophilus influenzae type b (Hib) vaccine  You may need this if you have certain conditions. You may receive vaccines as individual doses or as more than one vaccine together in one shot (combination vaccines). Talk with your health care provider about the risks and benefits of combination vaccines. What tests do I need? Blood tests  Lipid and cholesterol levels. These may be checked every 5 years, or more frequently depending on your overall health.  Hepatitis C test.  Hepatitis B test. Screening  Lung cancer screening. You may have this screening every year starting at age 39 if you have a 30-pack-year history of smoking and currently smoke or have quit within the past 15 years.  Colorectal cancer screening. All adults should have this screening starting at age 36 and continuing until age 15. Your health care provider may recommend screening at age 23 if you are at increased risk. You will have tests every 1-10 years, depending on your results and the type of screening test.  Diabetes screening. This is done by checking your blood sugar (glucose) after you have not eaten for a while (fasting). You may have this done every 1-3 years.  Mammogram. This may be done every 1-2 years. Talk with your health care provider about how often you should have regular mammograms.  BRCA-related cancer screening. This may be done if you have a family history of breast, ovarian, tubal, or peritoneal cancers.  Other tests  Sexually transmitted disease (STD) testing.  Bone density scan. This is done to screen for osteoporosis. You may have this done starting at age 44. Follow these instructions at home: Eating and drinking  Eat a diet that includes fresh fruits and vegetables, whole grains, lean protein, and low-fat dairy products. Limit  your intake of foods with high amounts of sugar, saturated fats, and salt.  Take vitamin and mineral supplements as recommended by your health care provider.  Do not drink alcohol if your health care provider tells you not to drink.  If you drink alcohol: ? Limit how much you have to 0-1 drink a day. ? Be aware of how much alcohol is in your drink. In the [place] one drink equals one 12 oz bottle of beer (355 mL), one 5 oz glass of wine (148 mL), or one 1 oz glass of hard liquor (44 mL). Lifestyle  Take daily care of your teeth and gums.  Stay active. Exercise for at least 30 minutes on 5 or more days each week.  Do not use any products that contain nicotine or tobacco, such as cigarettes, e-cigarettes, and chewing tobacco. If you need help quitting, ask your health care provider.  If you are sexually active, practice safe sex. Use a condom or other form of protection in order to prevent STIs (sexually transmitted infections).  Talk with your health care provider about taking a low-dose aspirin or statin. What's next?  Go to your health care provider once a year for a well check visit.  Ask your health care provider how often you should have your eyes and teeth checked.  Stay up to date on all vaccines. This information is not intended to replace advice given to you by your health care provider. Make sure you discuss any questions you have with your health care provider. Document Revised: 07/08/2018 Document Reviewed: 07/08/2018 Elsevier Patient Education  2020 [employer].

## 2020-06-28 NOTE — Patient Instructions (Signed)
Shannon Mckinney , Thank you for taking time to come for your Medicare Wellness Visit. I appreciate your ongoing commitment to your health goals. Please review the following plan we discussed and let me know if I can assist you in the future.   Screening recommendations/referrals: Colonoscopy: No longer required.  Mammogram: No longer required.  Bone Density: Up to date, due 07/2023 Recommended yearly ophthalmology/optometry visit for glaucoma screening and checkup Recommended yearly dental visit for hygiene and checkup  Vaccinations: Influenza vaccine: Up to date per patient. Requested date received at patient will check with pharmacy and let us know.  Pneumococcal vaccine: Completed series Tdap vaccine: Currently due, declined at this time.  Shingles vaccine: Shingrix discussed. Please contact your pharmacy for coverage information.     Advanced directives: Please bring a copy of your POA (Power of Attorney) and/or Living Will to your next appointment.   Conditions/risks identified: Recommend to continue to increase water intake to 6-8 8 oz glasses a day.   Next appointment: 2:00 PM with Dr Brita Romp    Preventive Care 84 Years and Older, Female Preventive care refers to lifestyle choices and visits with your health care provider that can promote health and wellness. What does preventive care include?  A yearly physical exam. This is also called an annual well check.  Dental exams once or twice a year.  Routine eye exams. Ask your health care provider how often you should have your eyes checked.  Personal lifestyle choices, including:  Daily care of your teeth and gums.  Regular physical activity.  Eating a healthy diet.  Avoiding tobacco and drug use.  Limiting alcohol use.  Practicing safe sex.  Taking low-dose aspirin every day.  Taking vitamin and mineral supplements as recommended by your health care provider. What happens during an annual well check? The services  and screenings done by your health care provider during your annual well check will depend on your age, overall health, lifestyle risk factors, and family history of disease. Counseling  Your health care provider may ask you questions about your:  Alcohol use.  Tobacco use.  Drug use.  Emotional well-being.  Home and relationship well-being.  Sexual activity.  Eating habits.  History of falls.  Memory and ability to understand (cognition).  Work and work Statistician.  Reproductive health. Screening  You may have the following tests or measurements:  Height, weight, and BMI.  Blood pressure.  Lipid and cholesterol levels. These may be checked every 5 years, or more frequently if you are over 2 years old.  Skin check.  Lung cancer screening. You may have this screening every year starting at age 84 if you have a 30-pack-year history of smoking and currently smoke or have quit within the past 15 years.  Fecal occult blood test (FOBT) of the stool. You may have this test every year starting at age 12.  Flexible sigmoidoscopy or colonoscopy. You may have a sigmoidoscopy every 5 years or a colonoscopy every 10 years starting at age 72.  Hepatitis C blood test.  Hepatitis B blood test.  Sexually transmitted disease (STD) testing.  Diabetes screening. This is done by checking your blood sugar (glucose) after you have not eaten for a while (fasting). You may have this done every 1-3 years.  Bone density scan. This is done to screen for osteoporosis. You may have this done starting at age 45.  Mammogram. This may be done every 1-2 years. Talk to your health care provider about how often  you should have regular mammograms. Talk with your health care provider about your test results, treatment options, and if necessary, the need for more tests. Vaccines  Your health care provider may recommend certain vaccines, such as:  Influenza vaccine. This is recommended every  year.  Tetanus, diphtheria, and acellular pertussis (Tdap, Td) vaccine. You may need a Td booster every 10 years.  Zoster vaccine. You may need this after age 84.  Pneumococcal 13-valent conjugate (PCV13) vaccine. One dose is recommended after age 84.  Pneumococcal polysaccharide (PPSV23) vaccine. One dose is recommended after age 84. Talk to your health care provider about which screenings and vaccines you need and how often you need them. This information is not intended to replace advice given to you by your health care provider. Make sure you discuss any questions you have with your health care provider. Document Released: 08/10/2015 Document Revised: 04/02/2016 Document Reviewed: 05/15/2015 Elsevier Interactive Patient Education  2017 [place] Prevention in the Home Falls can cause injuries. They can happen to people of all ages. There are many things you can do to make your home safe and to help prevent falls. What can I do on the outside of my home?  Regularly fix the edges of walkways and driveways and fix any cracks.  Remove anything that might make you trip as you walk through a door, such as a raised step or threshold.  Trim any bushes or trees on the path to your home.  Use bright outdoor lighting.  Clear any walking paths of anything that might make someone trip, such as rocks or tools.  Regularly check to see if handrails are loose or broken. Make sure that both sides of any steps have handrails.  Any raised decks and porches should have guardrails on the edges.  Have any leaves, snow, or ice cleared regularly.  Use sand or salt on walking paths during winter.  Clean up any spills in your garage right away. This includes oil or grease spills. What can I do in the bathroom?  Use night lights.  Install grab bars by the toilet and in the tub and shower. Do not use towel bars as grab bars.  Use non-skid mats or decals in the tub or shower.  If you  need to sit down in the shower, use a plastic, non-slip stool.  Keep the floor dry. Clean up any water that spills on the floor as soon as it happens.  Remove soap buildup in the tub or shower regularly.  Attach bath mats securely with double-sided non-slip rug tape.  Do not have throw rugs and other things on the floor that can make you trip. What can I do in the bedroom?  Use night lights.  Make sure that you have a light by your bed that is easy to reach.  Do not use any sheets or blankets that are too big for your bed. They should not hang down onto the floor.  Have a firm chair that has side arms. You can use this for support while you get dressed.  Do not have throw rugs and other things on the floor that can make you trip. What can I do in the kitchen?  Clean up any spills right away.  Avoid walking on wet floors.  Keep items that you use a lot in easy-to-reach places.  If you need to reach something above you, use a strong step stool that has a grab bar.  Keep electrical cords  out of the way.  Do not use floor polish or wax that makes floors slippery. If you must use wax, use non-skid floor wax.  Do not have throw rugs and other things on the floor that can make you trip. What can I do with my stairs?  Do not leave any items on the stairs.  Make sure that there are handrails on both sides of the stairs and use them. Fix handrails that are broken or loose. Make sure that handrails are as long as the stairways.  Check any carpeting to make sure that it is firmly attached to the stairs. Fix any carpet that is loose or worn.  Avoid having throw rugs at the top or bottom of the stairs. If you do have throw rugs, attach them to the floor with carpet tape.  Make sure that you have a light switch at the top of the stairs and the bottom of the stairs. If you do not have them, ask someone to add them for you. What else can I do to help prevent falls?  Wear shoes  that:  Do not have high heels.  Have rubber bottoms.  Are comfortable and fit you well.  Are closed at the toe. Do not wear sandals.  If you use a stepladder:  Make sure that it is fully opened. Do not climb a closed stepladder.  Make sure that both sides of the stepladder are locked into place.  Ask someone to hold it for you, if possible.  Clearly mark and make sure that you can see:  Any grab bars or handrails.  First and last steps.  Where the edge of each step is.  Use tools that help you move around (mobility aids) if they are needed. These include:  Canes.  Walkers.  Scooters.  Crutches.  Turn on the lights when you go into a dark area. Replace any light bulbs as soon as they burn out.  Set up your furniture so you have a clear path. Avoid moving your furniture around.  If any of your floors are uneven, fix them.  If there are any pets around you, be aware of where they are.  Review your medicines with your doctor. Some medicines can make you feel dizzy. This can increase your chance of falling. Ask your doctor what other things that you can do to help prevent falls. This information is not intended to replace advice given to you by your health care provider. Make sure you discuss any questions you have with your health care provider. Document Released: 05/10/2009 Document Revised: 12/20/2015 Document Reviewed: 08/18/2014 Elsevier Interactive Patient Education  2017 [employer].

## 2020-06-28 NOTE — Progress Notes (Signed)
Chronic Follow Up   Patient: Shannon Mckinney   DOB: July 26, 1929   86 y.o. Female  MRN: [id number] Visit Date: 06/28/2020  Today's healthcare provider: Lavon Paganini, MD   Chief Complaint  Patient presents with  . Diabetes  . Hyperlipidemia  . Hypothyroidism   Subjective    Shannon Mckinney is a 86 y.o. female who presents today for a AWV f/u.  She reports consuming a general diet. Exercise consists of strenuous yard work She generally feels well. She reports sleeping well. She does not have additional problems to discuss today.  HPI    Past Medical History:  Diagnosis Date  . Allergy   . Arthritis   . Benign head tremor   . Diabetes mellitus ([place])    diet controlled  . Fibrocystic breast disease   . Heart murmur   . History of abnormal Pap smear   . Hx of basal cell carcinoma 11/06/2006   R nasolabial, excised 11/24/2006  . Hx of basal cell carcinoma 04/10/2009   Mid nasal dorsum  . Hx of basal cell carcinoma 01/26/2017   Top of L shoulder  . Hypercholesterolemia   . Hyperthyroidism    s/p ablation  . Squamous cell carcinoma of skin 01/21/2016   R temple above lat brow  . Squamous cell carcinoma of skin 06/23/2016   L upper lip, excised 08/04/2016   Past Surgical History:  Procedure Laterality Date  . BREAST BIOPSY  70's   benign  . CARPAL TUNNEL RELEASE     right hand  . CATARACT EXTRACTION W/PHACO Right 05/09/2020   Procedure: CATARACT EXTRACTION PHACO AND INTRAOCULAR LENS PLACEMENT ([facility]) RIGHT DIABETIC 10.17  01:29.9  11.3%;  Surgeon: Leandrew Koyanagi, MD;  Location: [facility];  Service: Ophthalmology;  Laterality: Right;  . CATARACT EXTRACTION W/PHACO Left 05/30/2020   Procedure: CATARACT EXTRACTION PHACO AND INTRAOCULAR LENS PLACEMENT ([place]) LEFT DIABETIC;  Surgeon: Leandrew Koyanagi, MD;  Location: [facility];  Service: Ophthalmology;  Laterality: Left;  12.20 1:24.7 14.4%  . KNEE ARTHROSCOPY Left 09/12/2015   Procedure:  ARTHROSCOPY LEFT KNEE, PARTIAL MEDIAL MENISECTOMY, PARTIAL LATERAL MENISECTOMY, CHONDROPLASTY MEDIAL AND LATERAL;  Surgeon: Dereck Leep, MD;  Location: [facility];  Service: Orthopedics;  Laterality: Left;  . SHOULDER ARTHROSCOPY    . TONSILLECTOMY     Social History   Socioeconomic History  . Marital status: Widowed    Spouse name: Not on file  . Number of children: 2  . Years of education: Not on file  . Highest education level: Bachelor's degree (e.g., BA, AB, BS)  Occupational History  . Occupation: retired Arboriculturist   Tobacco Use  . Smoking status: Never Smoker  . Smokeless tobacco: Never Used  Vaping Use  . Vaping Use: Never used  Substance and Sexual Activity  . Alcohol use: Yes    Alcohol/week: 0.0 standard drinks    Comment: rare glass of wine  . Drug use: No  . Sexual activity: Not Currently  Other Topics Concern  . Not on file  Social History Narrative  . Not on file   Social Determinants of Health   Financial Resource Strain: Low Risk   . Difficulty of Paying Living Expenses: Not hard at all  Food Insecurity: No Food Insecurity  . Worried About Charity fundraiser in the Last Year: Never true  . Ran Out of Food in the Last Year: Never true  Transportation Needs: No Transportation Needs  . Lack of  Transportation (Medical): No  . Lack of Transportation (Non-Medical): No  Physical Activity: Inactive  . Days of Exercise per Week: 0 days  . Minutes of Exercise per Session: 0 min  Stress: No Stress Concern Present  . Feeling of Stress : Not at all  Social Connections: Socially Isolated  . Frequency of Communication with Friends and Family: More than three times a week  . Frequency of Social Gatherings with Friends and Family: More than three times a week  . Attends Religious Services: Never  . Active Member of Clubs or Organizations: No  . Attends Archivist Meetings: Never  . Marital Status: Widowed  Intimate Partner Violence: Not At Risk    . Fear of Current or Ex-Partner: No  . Emotionally Abused: No  . Physically Abused: No  . Sexually Abused: No   Family Status  Relation Name Status  . Father  Deceased  . Mother  Deceased  . Brother  Deceased  . Brother  Deceased  . Sister  Deceased  . Neg Hx  (Not Specified)   Family History  Problem Relation Age of Onset  . Heart disease Father        myocardial infarction - died 52  . Diabetes Father   . Heart disease Mother        myocardial infarction  . Heart disease Brother        x2  . Diabetes Brother   . Diabetes Brother   . Diabetes Sister   . Breast cancer Neg Hx   . Colon cancer Neg Hx    Allergies  Allergen Reactions  . Mysoline [Primidone] Other (See Comments)    "unknown"  . Penicillins Rash    Patient Care Team: Virginia Crews, MD as PCP - General (Family Medicine) Leandrew Koyanagi, MD as Referring Physician (Ophthalmology) Brendolyn Patty, MD (Dermatology) Concepcion Elk., MD (Dentistry)   Medications: Outpatient Medications Prior to Visit  Medication Sig  . Apoaequorin (PREVAGEN PO) Take by mouth daily.  Marland Kitchen levothyroxine (SYNTHROID) 88 MCG tablet Take 1 tablet (88 mcg total) by mouth daily before breakfast.   No facility-administered medications prior to visit.    Review of Systems  Constitutional: Negative.   HENT: Negative.   Eyes: Negative.   Respiratory: Negative.   Cardiovascular: Negative.   Gastrointestinal: Negative.   Endocrine: Negative.   Genitourinary: Negative.   Musculoskeletal: Negative.   Skin: Negative.   Neurological: Negative.   Hematological: Negative.   Psychiatric/Behavioral: Negative.       Objective    BP 116/60 (BP Location: Left Arm, Patient Position: Sitting, Cuff Size: Normal)   Pulse 74   Temp 98.3 F (36.8 C) (Oral)   Resp 16   Ht 5\' 2"  (1.575 m)   Wt 114 lb 14.4 oz (52.1 kg)   SpO2 99%   BMI 21.02 kg/m    Physical Exam   General: Well appearing in NAD Pulm: Lungs CAR  bilaterally Cards: RRR, II/IV holosystolic murmur GI: Soft non-tender Psych: A&O x3  Mood: 'alright I guess'  Affect: Mood congruent, euthymic  Diabetic Foot Exam - Simple   Simple Foot Form Visual Inspection No deformities, no ulcerations, no other skin breakdown bilaterally: Yes Sensation Testing Intact to touch and monofilament testing bilaterally: Yes Pulse Check Posterior Tibialis and Dorsalis pulse intact bilaterally: Yes Comments      Last depression screening scores PHQ 2/9 Scores 06/28/2020 06/27/2019 06/21/2018  PHQ - 2 Score 1 1 1   PHQ- 9 Score - - -  Last fall risk screening Fall Risk  06/28/2020  Falls in the past year? 0  Number falls in past yr: 0  Injury with Fall? 0  Follow up -   Last Audit-C alcohol use screening Alcohol Use Disorder Test (AUDIT) 06/28/2020  1. How often do you have a drink containing alcohol? 0  2. How many drinks containing alcohol do you have on a typical day when you are drinking? 0  3. How often do you have six or more drinks on one occasion? 0  AUDIT-C Score 0  Alcohol Brief Interventions/Follow-up AUDIT Score <7 follow-up not indicated   A score of 3 or more in women, and 4 or more in men indicates increased risk for alcohol abuse, EXCEPT if all of the points are from question 1   Results for orders placed or performed in visit on 06/28/20  TSH  Result Value Ref Range   TSH 0.342 (L) 0.450 - 4.500 uIU/mL  Hemoglobin A1c  Result Value Ref Range   Hgb A1c MFr Bld 5.8 (H) 4.8 - 5.6 %   Est. average glucose Bld gHb Est-mCnc 120 mg/dL  Lipid panel  Result Value Ref Range   Cholesterol, Total 234 (H) 100 - 199 mg/dL   Triglycerides 242 (H) 0 - 149 mg/dL   HDL 65 >39 mg/dL   VLDL Cholesterol Cal 42 (H) 5 - 40 mg/dL   LDL Chol Calc (NIH) 127 (H) 0 - 99 mg/dL   Chol/HDL Ratio 3.6 0.0 - 4.4 ratio  Comprehensive metabolic panel  Result Value Ref Range   Glucose 188 (H) 65 - 99 mg/dL   BUN 16 8 - 27 mg/dL   Creatinine, Ser 0.77  0.57 - 1.00 mg/dL   GFR calc non Af Amer 69 >59 mL/min/1.73   GFR calc Af Amer 79 >59 mL/min/1.73   BUN/Creatinine Ratio 21 12 - 28   Sodium 144 134 - 144 mmol/L   Potassium 3.4 (L) 3.5 - 5.2 mmol/L   Chloride 103 96 - 106 mmol/L   CO2 25 20 - 29 mmol/L   Calcium 9.0 8.7 - 10.3 mg/dL   Total Protein 6.2 6.0 - 8.5 g/dL   Albumin 4.3 3.6 - 4.6 g/dL   Globulin, Total 1.9 1.5 - 4.5 g/dL   Albumin/Globulin Ratio 2.3 (H) 1.2 - 2.2   Bilirubin Total 0.3 0.0 - 1.2 mg/dL   Alkaline Phosphatase 86 44 - 121 IU/L   AST 11 0 - 40 IU/L   ALT 12 0 - 32 IU/L  VITAMIN D 25 Hydroxy (Vit-D Deficiency, Fractures)  Result Value Ref Range   Vit D, 25-Hydroxy 39.8 30.0 - 100.0 ng/mL    Assessment & Plan    Routine Health Maintenance and Physical Exam  Exercise Activities and Dietary recommendations Goals    . DIET - INCREASE WATER INTAKE     Recommend to drink at least 6-8 8oz glasses of water per day.     Marland Kitchen Healthy Lifestyle     Maintain drinking plenty of fluids, exercise regimen and low carb foods.       Immunization History  Administered Date(s) Administered  . Fluad Quad(high Dose 65+) 05/24/2020  . Influenza Split 04/27/2012, 05/16/2013, 05/11/2014  . Influenza, High Dose Seasonal PF 03/30/2018  . Influenza,inj,Quad PF,6+ Mos 06/11/2017  . Influenza-Unspecified 04/24/2014, 06/04/2015, 06/24/2016, 05/25/2019  . PFIZER SARS-COV-2 Vaccination 08/08/2019, 08/29/2019, 06/08/2020  . Pneumococcal Conjugate-13 07/04/2013  . Pneumococcal Polysaccharide-23 09/12/2016    Health Maintenance  Topic Date Due  .  TETANUS/TDAP  06/28/2021 (Originally 11/29/1947)  . FOOT EXAM  12/07/2020  . URINE MICROALBUMIN  12/07/2020  . HEMOGLOBIN A1C  12/27/2020  . OPHTHALMOLOGY EXAM  02/20/2021  . DEXA SCAN  08/20/2023  . INFLUENZA VACCINE  Completed  . COVID-19 Vaccine  Completed  . PNA vac Low Risk Adult  Completed  . MAMMOGRAM  Discontinued    Discussed health benefits of physical activity, and  encouraged her to engage in regular exercise appropriate for her age and condition.  Problem List Items Addressed This Visit      Endocrine   Diabetes mellitus ([place])    Previously well controlled Currently not on any medications Recheck A1c Recheck foot exam F/u in 6 months      Relevant Orders   Hemoglobin A1c (Completed)   Comprehensive metabolic panel (Completed)   Postablative hypothyroidism - Primary    Previously well controlled  Continue synthroid at current dose  Recheck TSH and adjust synthroid as indicated       Relevant Orders   TSH (Completed)     Other   Hypercholesterolemia    Recheck FLP Currently not on a statin  Counseled on diet and exercise      Relevant Orders   Lipid panel (Completed)   Comprehensive metabolic panel (Completed)   Vitamin D deficiency    Advised to continue taking Vitamin D Recheck - level      Relevant Orders   VITAMIN D 25 Hydroxy (Vit-D Deficiency, Fractures) (Completed)       Return in about 6 months (around 12/27/2020) for chronic disease f/u.     Patient seen along with MS3 student [employer] [facility]. I personally evaluated this patient along with the student, and verified all aspects of the history, physical exam, and medical decision making as documented by the student. I agree with the student's documentation and have made all necessary edits.  Denetria Luevanos, Dionne Bucy, MD, MPH [facility]

## 2020-06-28 NOTE — Assessment & Plan Note (Signed)
Previously well controlled  Continue synthroid at current dose  Recheck TSH and adjust synthroid as indicated

## 2020-06-28 NOTE — Assessment & Plan Note (Signed)
Recheck FLP Currently not on a statin  Counseled on diet and exercise

## 2020-06-28 NOTE — Assessment & Plan Note (Addendum)
Advised to continue taking Vitamin D Recheck - level

## 2020-06-29 LAB — COMPREHENSIVE METABOLIC PANEL
ALT: 12 IU/L (ref 0–32)
AST: 11 IU/L (ref 0–40)
Albumin/Globulin Ratio: 2.3 — ABNORMAL HIGH (ref 1.2–2.2)
Albumin: 4.3 g/dL (ref 3.6–4.6)
Alkaline Phosphatase: 86 IU/L (ref 44–121)
BUN/Creatinine Ratio: 21 (ref 12–28)
BUN: 16 mg/dL (ref 8–27)
Bilirubin Total: 0.3 mg/dL (ref 0.0–1.2)
CO2: 25 mmol/L (ref 20–29)
Calcium: 9 mg/dL (ref 8.7–10.3)
Chloride: 103 mmol/L (ref 96–106)
Creatinine, Ser: 0.77 mg/dL (ref 0.57–1.00)
GFR calc Af Amer: 79 mL/min/{1.73_m2} (ref >59)
GFR calc non Af Amer: 69 mL/min/{1.73_m2} (ref >59)
Globulin, Total: 1.9 g/dL (ref 1.5–4.5)
Glucose: 188 mg/dL — ABNORMAL HIGH (ref 65–99)
Potassium: 3.4 mmol/L — ABNORMAL LOW (ref 3.5–5.2)
Sodium: 144 mmol/L (ref 134–144)
Total Protein: 6.2 g/dL (ref 6.0–8.5)

## 2020-06-29 LAB — VITAMIN D 25 HYDROXY (VIT D DEFICIENCY, FRACTURES): Vit D, 25-Hydroxy: 39.8 ng/mL (ref 30.0–100.0)

## 2020-06-29 LAB — LIPID PANEL
Chol/HDL Ratio: 3.6 ratio (ref 0.0–4.4)
Cholesterol, Total: 234 mg/dL — ABNORMAL HIGH (ref 100–199)
HDL: 65 mg/dL (ref >39)
LDL Chol Calc (NIH): 127 mg/dL — ABNORMAL HIGH (ref 0–99)
Triglycerides: 242 mg/dL — ABNORMAL HIGH (ref 0–149)
VLDL Cholesterol Cal: 42 mg/dL — ABNORMAL HIGH (ref 5–40)

## 2020-06-29 LAB — HEMOGLOBIN A1C
Est. average glucose Bld gHb Est-mCnc: 120 mg/dL
Hgb A1c MFr Bld: 5.8 % — ABNORMAL HIGH (ref 4.8–5.6)

## 2020-06-29 LAB — TSH: TSH: 0.342 u[IU]/mL — ABNORMAL LOW (ref 0.450–4.500)

## 2020-07-03 ENCOUNTER — Telehealth: Payer: Self-pay

## 2020-07-03 ENCOUNTER — Telehealth: Payer: Self-pay | Admitting: Family Medicine

## 2020-07-03 DIAGNOSIS — E890 Postprocedural hypothyroidism: Secondary | ICD-10-CM

## 2020-07-03 MED ORDER — LEVOTHYROXINE SODIUM 88 MCG PO TABS: 88.0000 ug | ORAL_TABLET | ORAL | 3 refills | Status: DC

## 2020-07-03 MED ORDER — LEVOTHYROXINE SODIUM 75 MCG PO TABS: 75.0000 ug | ORAL_TABLET | ORAL | 3 refills | Status: DC

## 2020-07-03 NOTE — Telephone Encounter (Signed)
-----   Message from Virginia Crews, MD sent at 06/29/2020 11:02 AM EST ----- Stable labs, except slightly low potassium and low TSH.  Eat potassium rich foods.  TSH was high on 56mcg and low at 32mcg. Ideally, would take 80mcg and 3mcg Synthroid on alternating days to have a happy medium if patient is able to do this. Recheck in 2 months.

## 2020-07-03 NOTE — Telephone Encounter (Signed)
Returned call to patient. Pt given lab results per notes of Dr. Brita Romp on 06/29/20. Pt verbalized understanding. Patient is agreeable to take the different dosages of Synthroid on alternating days. Patient would like new dosage of Synthroid to be sent to [facility].

## 2020-07-03 NOTE — Telephone Encounter (Signed)
Copied from [place] [id number]. Topic: Quick Communication - Lab Results (Clinic Use ONLY) >> Jul 03, 2020 10:39 AM Dorian Pod, CMA wrote: Called patient to inform them of lab results. When patient returns call, triage nurse may disclose results. >> Jul 03, 2020  2:44 PM Greggory Keen D wrote: Pt returned call. Triage was busy.  Please call patient back.Marland KitchenMarland Kitchen

## 2020-07-03 NOTE — Telephone Encounter (Signed)
Patient advised by nurse triage at [facility]

## 2020-07-19 DIAGNOSIS — H35372 Puckering of macula, left eye: Secondary | ICD-10-CM | POA: Diagnosis not present

## 2020-10-19 ENCOUNTER — Ambulatory Visit
Admission: RE | Admit: 2020-10-19 | Discharge: 2020-10-19 | Disposition: A | Discharge status: Home / Self Care | Payer: Medicare Other | Attending: Adult Health | Admitting: Adult Health

## 2020-10-19 ENCOUNTER — Ambulatory Visit (INDEPENDENT_AMBULATORY_CARE_PROVIDER_SITE_OTHER): Payer: Medicare Other | Admitting: Adult Health

## 2020-10-19 ENCOUNTER — Ambulatory Visit
Admission: RE | Admit: 2020-10-19 | Discharge: 2020-10-19 | Disposition: A | Discharge status: Home / Self Care | Payer: Medicare Other | Source: Ambulatory Visit | Attending: Adult Health | Admitting: Adult Health

## 2020-10-19 ENCOUNTER — Encounter: Payer: Self-pay | Admitting: Adult Health

## 2020-10-19 ENCOUNTER — Other Ambulatory Visit: Payer: Self-pay

## 2020-10-19 VITALS — BP 134/61 | HR 70 | Temp 97.6°F | Resp 16 | Wt 118.2 lb

## 2020-10-19 DIAGNOSIS — H60392 Other infective otitis externa, left ear: Secondary | ICD-10-CM | POA: Diagnosis not present

## 2020-10-19 DIAGNOSIS — R0989 Other specified symptoms and signs involving the circulatory and respiratory systems: Secondary | ICD-10-CM | POA: Insufficient documentation

## 2020-10-19 DIAGNOSIS — H6090 Unspecified otitis externa, unspecified ear: Secondary | ICD-10-CM | POA: Insufficient documentation

## 2020-10-19 DIAGNOSIS — R5383 Other fatigue: Secondary | ICD-10-CM

## 2020-10-19 DIAGNOSIS — C3432 Malignant neoplasm of lower lobe, left bronchus or lung: Secondary | ICD-10-CM | POA: Diagnosis not present

## 2020-10-19 DIAGNOSIS — E46 Unspecified protein-calorie malnutrition: Secondary | ICD-10-CM | POA: Diagnosis not present

## 2020-10-19 LAB — POCT URINALYSIS DIPSTICK
Bilirubin, UA: NEGATIVE
Blood, UA: NEGATIVE
Glucose, UA: NEGATIVE
Ketones, UA: NEGATIVE
Leukocytes, UA: NEGATIVE
Nitrite, UA: NEGATIVE
Protein, UA: NEGATIVE
Spec Grav, UA: <=1.005 — AB (ref 1.010–1.025)
Urobilinogen, UA: 0.2 E.U./dL
pH, UA: 6 (ref 5.0–8.0)

## 2020-10-19 MED ORDER — NEOMYCIN-POLYMYXIN-HC 3.5-10000-1 OT SOLN: 3.0000 [drp] | Freq: Four times a day (QID) | OTIC | 0 refills | Status: DC

## 2020-10-19 MED ORDER — DOXYCYCLINE HYCLATE 100 MG PO TABS: 100.0000 mg | ORAL_TABLET | Freq: Two times a day (BID) | ORAL | 0 refills | Status: DC

## 2020-10-19 NOTE — Progress Notes (Signed)
Established patient visit   Patient: Shannon Mckinney   DOB: 1929-04-09   87 y.o. Female  MRN: [id number] Visit Date: 10/19/2020  Today's healthcare provider: Marcille Buffy, FNP   Chief Complaint  Patient presents with  . Altered Mental Status    Patient presents in office today with her care giver who has concerns that patient might have a urinary infection. She reports for the past week patient has had decreased energy and has been more forgetful than usual.    Subjective    HPI HPI    Altered Mental Status     Additional comments: Patient presents in office today with her care giver who has concerns that patient might have a urinary infection. She reports for the past week patient has had decreased energy and has been more forgetful than usual.        Last edited by Minette Headland, CMA on 10/19/2020  3:18 PM. (History)     loss since of smell, after sinus issues a couple of months ago.  Diarrhea around one week ago now resolved.  She has had fatigue and loss of appetite but this is improving. Denies any falls.  Caregiver Marquetta Jphnson is present.     Patient Active Problem List   Diagnosis Date Noted  . Fatigue 10/19/2020  . Gastroenteritis 05/27/2017  . Vitamin D deficiency 06/08/2016  . S/P arthroscopy of knee 11/03/2015  . Murmur 08/21/2015  . Postablative hypothyroidism 04/27/2014  . Thyroid nodule 02/05/2014  . Osteopenia 08/09/2012  . Diabetes mellitus ([place]) 08/04/2012  . Hypercholesterolemia 08/04/2012   Past Medical History:  Diagnosis Date  . Allergy   . Arthritis   . Benign head tremor   . Diabetes mellitus ([place])    diet controlled  . Fibrocystic breast disease   . Heart murmur   . History of abnormal Pap smear   . Hx of basal cell carcinoma 11/06/2006   R nasolabial, excised 11/24/2006  . Hx of basal cell carcinoma 04/10/2009   Mid nasal dorsum  . Hx of basal cell carcinoma 01/26/2017   Top of L shoulder  .  Hypercholesterolemia   . Hyperthyroidism    s/p ablation  . Squamous cell carcinoma of skin 01/21/2016   R temple above lat brow  . Squamous cell carcinoma of skin 06/23/2016   L upper lip, excised 08/04/2016   Allergies  Allergen Reactions  . Mysoline [Primidone] Other (See Comments)    "unknown"  . Penicillins Rash       Medications: Outpatient Medications Prior to Visit  Medication Sig  . levothyroxine (SYNTHROID) 75 MCG tablet Take 1 tablet (75 mcg total) by mouth every other day.  . levothyroxine (SYNTHROID) 88 MCG tablet Take 1 tablet (88 mcg total) by mouth every other day.  Marland Kitchen Apoaequorin (PREVAGEN PO) Take by mouth daily. (Patient not taking: Reported on 10/19/2020)   No facility-administered medications prior to visit.    Review of Systems  Constitutional: Positive for fatigue. Negative for activity change, chills, diaphoresis, fever and unexpected weight change. Appetite change: caregiver reports she is eating less.   HENT: Positive for ear pain, postnasal drip, rhinorrhea and sinus pressure.   Respiratory: Positive for cough.   Cardiovascular: Negative for chest pain, palpitations and leg swelling.  Gastrointestinal: Negative.   Genitourinary: Negative.   Musculoskeletal: Positive for arthralgias.  Neurological: Negative.   Psychiatric/Behavioral: Negative.     Last CBC Lab Results  Component Value Date   WBC  5.1 06/24/2017   HGB 13.4 06/24/2017   HCT 40.3 06/24/2017   MCV 91.2 06/24/2017   MCH 30.4 09/04/2015   RDW 14.5 06/24/2017   PLT 183.0 22/97/9892   Last metabolic panel Lab Results  Component Value Date   GLUCOSE 188 (H) 06/28/2020   NA 144 06/28/2020   K 3.4 (L) 06/28/2020   CL 103 06/28/2020   CO2 25 06/28/2020   BUN 16 06/28/2020   CREATININE 0.77 06/28/2020   GFRNONAA 69 06/28/2020   GFRAA 79 06/28/2020   CALCIUM 9.0 06/28/2020   PROT 6.2 06/28/2020   ALBUMIN 4.3 06/28/2020   LABGLOB 1.9 06/28/2020   AGRATIO 2.3 (H) 06/28/2020    BILITOT 0.3 06/28/2020   ALKPHOS 86 06/28/2020   AST 11 06/28/2020   ALT 12 06/28/2020   ANIONGAP 11 09/04/2015   Last lipids Lab Results  Component Value Date   CHOL 234 (H) 06/28/2020   HDL 65 06/28/2020   LDLCALC 127 (H) 06/28/2020   LDLDIRECT 130.6 06/22/2013   TRIG 242 (H) 06/28/2020   CHOLHDL 3.6 06/28/2020   Last hemoglobin A1c Lab Results  Component Value Date   HGBA1C 5.8 (H) 06/28/2020   Last thyroid functions Lab Results  Component Value Date   TSH 0.342 (L) 06/28/2020   Last vitamin D Lab Results  Component Value Date   VD25OH 39.8 06/28/2020   Last vitamin B12 and Folate No results found for: VITAMINB12, FOLATE     Objective    BP 134/61   Pulse 70   Temp 97.6 F (36.4 C) (Oral)   Resp 16   Wt 118 lb 3.2 oz (53.6 kg)   SpO2 98%   BMI 21.62 kg/m  BP Readings from Last 3 Encounters:  10/19/20 134/61  06/28/20 116/60  05/30/20 130/60   Wt Readings from Last 3 Encounters:  10/19/20 118 lb 3.2 oz (53.6 kg)  06/28/20 114 lb 14.4 oz (52.1 kg)  05/30/20 112 lb (50.8 kg)       Physical Exam Vitals reviewed.  Constitutional:      General: She is not in acute distress.    Appearance: Normal appearance. She is well-developed. She is not ill-appearing, toxic-appearing or diaphoretic.     Interventions: She is not intubated. HENT:     Head: Normocephalic and atraumatic.     Jaw: There is normal jaw occlusion.     Salivary Glands: Right salivary gland is not diffusely enlarged or tender. Left salivary gland is not diffusely enlarged or tender.     Right Ear: Tympanic membrane, ear canal and external ear normal. No middle ear effusion. There is no impacted cerumen.     Left Ear: Ear canal normal. Swelling and tenderness present. No drainage.  No middle ear effusion.     Nose:     Right Turbinates: Swollen. Not enlarged or pale.     Left Turbinates: Swollen.     Right Sinus: Maxillary sinus tenderness present. No frontal sinus tenderness.     Left  Sinus: Maxillary sinus tenderness present. No frontal sinus tenderness.     Mouth/Throat:     Lips: Pink.     Mouth: Mucous membranes are moist.     Pharynx: Oropharynx is clear. Uvula midline. No oropharyngeal exudate.  Eyes:     General: Lids are normal. No scleral icterus.       Right eye: No discharge.        Left eye: No discharge.     Conjunctiva/sclera: Conjunctivae normal.  Right eye: Right conjunctiva is not injected. No exudate or hemorrhage.    Left eye: Left conjunctiva is not injected. No exudate or hemorrhage.    Pupils: Pupils are equal, round, and reactive to light.  Neck:     Thyroid: No thyroid mass or thyromegaly.     Vascular: Normal carotid pulses. No carotid bruit, hepatojugular reflux or JVD.     Trachea: Trachea and phonation normal. No tracheal tenderness or tracheal deviation.     Meningeal: Brudzinski's sign and Kernig's sign absent.  Cardiovascular:     Rate and Rhythm: Normal rate and regular rhythm.     Pulses: Normal pulses.          Radial pulses are 2+ on the right side and 2+ on the left side.       Dorsalis pedis pulses are 2+ on the right side and 2+ on the left side.       Posterior tibial pulses are 2+ on the right side and 2+ on the left side.     Heart sounds: Normal heart sounds, S1 normal and S2 normal. Heart sounds not distant. No murmur heard. No friction rub. No gallop.   Pulmonary:     Effort: Pulmonary effort is normal. No tachypnea, bradypnea, accessory muscle usage or respiratory distress. She is not intubated.     Breath sounds: No stridor. Rhonchi (scant scattered in left lower lung intermittently with ausculatation. ) present. No wheezing or rales.  Chest:     Chest wall: No tenderness.  Breasts:     Right: No supraclavicular adenopathy.     Left: No supraclavicular adenopathy.    Abdominal:     General: Bowel sounds are normal. There is no distension or abdominal bruit.     Palpations: Abdomen is soft. There is no shifting  dullness, fluid wave, hepatomegaly, splenomegaly, mass or pulsatile mass.     Tenderness: There is no abdominal tenderness. There is no guarding or rebound.     Hernia: No hernia is present.  Musculoskeletal:        General: No tenderness or deformity. Normal range of motion.     Cervical back: Full passive range of motion without pain, normal range of motion and neck supple. No edema, erythema or rigidity. No spinous process tenderness or muscular tenderness. Normal range of motion.  Lymphadenopathy:     Head:     Right side of head: No submental, submandibular, tonsillar, preauricular, posterior auricular or occipital adenopathy.     Left side of head: No submental, submandibular, tonsillar, preauricular, posterior auricular or occipital adenopathy.     Cervical: No cervical adenopathy.     Right cervical: No superficial, deep or posterior cervical adenopathy.    Left cervical: No superficial, deep or posterior cervical adenopathy.     Upper Body:     Right upper body: No supraclavicular or pectoral adenopathy.     Left upper body: No supraclavicular or pectoral adenopathy.  Skin:    General: Skin is warm and dry.     Coloration: Skin is not pale.     Findings: No abrasion, bruising, burn, ecchymosis, erythema, lesion, petechiae or rash.     Nails: There is no clubbing.  Neurological:     General: No focal deficit present.     Mental Status: She is alert and oriented to person, place, and time.     GCS: GCS eye subscore is 4. GCS verbal subscore is 5. GCS motor subscore is 6.  Cranial Nerves: No cranial nerve deficit.     Sensory: No sensory deficit.     Motor: No weakness, tremor, atrophy, abnormal muscle tone or seizure activity.     Coordination: Coordination normal.     Gait: Gait normal.     Deep Tendon Reflexes: Reflexes are normal and symmetric. Reflexes normal. Babinski sign absent on the right side. Babinski sign absent on the left side.     Reflex Scores:      Tricep  reflexes are 2+ on the right side and 2+ on the left side.      Bicep reflexes are 2+ on the right side and 2+ on the left side.      Brachioradialis reflexes are 2+ on the right side and 2+ on the left side.      Patellar reflexes are 2+ on the right side and 2+ on the left side.      Achilles reflexes are 2+ on the right side and 2+ on the left side.    Comments: Patient appers well, not sickly. Speaking in complete sentences. Patient moves on and off of exam table and in room without difficulty. Gait is normal in hall and in room. Patient is oriented to person place time and situation. Patient answers questions appropriately and engages eye contact and verbal dialect with provider.   Psychiatric:        Mood and Affect: Mood normal.        Speech: Speech normal.        Behavior: Behavior normal.        Thought Content: Thought content normal.        Judgment: Judgment normal.      Results for orders placed or performed in visit on 10/19/20  POCT urinalysis dipstick  Result Value Ref Range   Color, UA yellow    Clarity, UA clear    Glucose, UA Negative Negative   Bilirubin, UA negative    Ketones, UA negative    Spec Grav, UA <=1.005 (A) 1.010 - 1.025   Blood, UA negative    pH, UA 6.0 5.0 - 8.0   Protein, UA Negative Negative   Urobilinogen, UA 0.2 0.2 or 1.0 E.U./dL   Nitrite, UA negative    Leukocytes, UA Negative Negative   Appearance     Odor      Assessment & Plan     Fatigue, unspecified type - Plan: POCT urinalysis dipstick, Urine Culture, CBC with Differential/Platelet, Comprehensive Metabolic Panel (CMET), doxycycline (VIBRA-TABS) 100 MG tablet, B12  [place] - Plan: DG Chest 2 View, doxycycline (VIBRA-TABS) 100 MG tablet  Other infective acute otitis externa of left ear - Plan: neomycin-polymyxin-hydrocortisone (CORTISPORIN) OTIC solution  Protein-calorie malnutrition, unspecified severity ([place])  - Plan: B12  Meds ordered this encounter  Medications  .  doxycycline (VIBRA-TABS) 100 MG tablet    Sig: Take 1 tablet (100 mg total) by mouth 2 (two) times daily.    Dispense:  20 tablet    Refill:  0  . neomycin-polymyxin-hydrocortisone (CORTISPORIN) OTIC solution    Sig: Place 3 drops into the left ear 4 (four) times daily.    Dispense:  10 mL    Refill:  0  caregiver reprts she is eating less. No weight loss noted today. She is in no acute distress will send urine for culture and send for CXR rule out pneumonia.  Labs today check TSH may need medication adjustment.  Patient is able to answer questions and no neurological deficit  in room.   Red Flags discussed. The patient was given clear instructions to go to ER or return to medical center if any red flags develop, symptoms do not improve, worsen or new problems develop. They verbalized understanding.  [phone] caregiver.  Return in about 1 week (around 10/26/2020), or if symptoms worsen or fail to improve, for at any time for any worsening symptoms, Go to Emergency room/ urgent care if worse.    The entirety of the information documented in the History of Present Illness, Review of Systems and Physical Exam were personally obtained by me. Portions of this information were initially documented by the CMA and reviewed by me for thoroughness and accuracy.      Marcille Buffy, [place] [phone] (phone) [phone] (fax)  [place]

## 2020-10-19 NOTE — Patient Instructions (Addendum)
Sinusitis, Adult Sinusitis is soreness and swelling (inflammation) of your sinuses. Sinuses are hollow spaces in the bones around your face. They are located:  Around your eyes.  In the middle of your forehead.  Behind your nose.  In your cheekbones. Your sinuses and nasal passages are lined with a fluid called mucus. Mucus drains out of your sinuses. Swelling can trap mucus in your sinuses. This lets germs (bacteria, virus, or fungus) grow, which leads to infection. Most of the time, this condition is caused by a virus. What are the causes? This condition is caused by:  Allergies.  Asthma.  Germs.  Things that block your nose or sinuses.  Growths in the nose (nasal polyps).  Chemicals or irritants in the air.  Fungus (rare). What increases the risk? You are more likely to develop this condition if:  You have a weak body defense system (immune system).  You do a lot of swimming or diving.  You use nasal sprays too much.  You smoke. What are the signs or symptoms? The main symptoms of this condition are pain and a feeling of pressure around the sinuses. Other symptoms include:  Stuffy nose (congestion).  Runny nose (drainage).  Swelling and warmth in the sinuses.  Headache.  Toothache.  A cough that may get worse at night.  Mucus that collects in the throat or the back of the nose (postnasal drip).  Being unable to smell and taste.  Being very tired (fatigue).  A fever.  Sore throat.  Bad breath. How is this diagnosed? This condition is diagnosed based on:  Your symptoms.  Your medical history.  A physical exam.  Tests to find out if your condition is short-term (acute) or long-term (chronic). Your doctor may: ? Check your nose for growths (polyps). ? Check your sinuses using a tool that has a light (endoscope). ? Check for allergies or germs. ? Do imaging tests, such as an MRI or CT scan. How is this treated? Treatment for this condition  depends on the cause and whether it is short-term or long-term.  If caused by a virus, your symptoms should go away on their own within 10 days. You may be given medicines to relieve symptoms. They include: ? Medicines that shrink swollen tissue in the nose. ? Medicines that treat allergies (antihistamines). ? A spray that treats swelling of the nostrils. ? Rinses that help get rid of thick mucus in your nose (nasal saline washes).  If caused by bacteria, your doctor may wait to see if you will get better without treatment. You may be given antibiotic medicine if you have: ? A very bad infection. ? A weak body defense system.  If caused by growths in the nose, you may need to have surgery. Follow these instructions at home: Medicines  Take, use, or apply over-the-counter and prescription medicines only as told by your doctor. These may include nasal sprays.  If you were prescribed an antibiotic medicine, take it as told by your doctor. Do not stop taking the antibiotic even if you start to feel better. Hydrate and humidify  Drink enough water to keep your pee (urine) pale yellow.  Use a cool mist humidifier to keep the humidity level in your home above 50%.  Breathe in steam for 10-15 minutes, 3-4 times a day, or as told by your doctor. You can do this in the bathroom while a hot shower is running.  Try not to spend time in cool or dry air.     Rest  Rest as much as you can.  Sleep with your head raised (elevated).  Make sure you get enough sleep each night. General instructions  Put a warm, moist washcloth on your face 3-4 times a day, or as often as told by your doctor. This will help with discomfort.  Wash your hands often with soap and water. If there is no soap and water, use hand sanitizer.  Do not smoke. Avoid being around people who are smoking (secondhand smoke).  Keep all follow-up visits as told by your doctor. This is important.   Contact a doctor if:  You  have a fever.  Your symptoms get worse.  Your symptoms do not get better within 10 days. Get help right away if:  You have a very bad headache.  You cannot stop throwing up (vomiting).  You have very bad pain or swelling around your face or eyes.  You have trouble seeing.  You feel confused.  Your neck is stiff.  You have trouble breathing. Summary  Sinusitis is swelling of your sinuses. Sinuses are hollow spaces in the bones around your face.  This condition is caused by tissues in your nose that become inflamed or swollen. This traps germs. These can lead to infection.  If you were prescribed an antibiotic medicine, take it as told by your doctor. Do not stop taking it even if you start to feel better.  Keep all follow-up visits as told by your doctor. This is important. This information is not intended to replace advice given to you by your health care provider. Make sure you discuss any questions you have with your health care provider. Document Revised: 12/14/2017 Document Reviewed: 12/14/2017 Elsevier Patient Education  2021 [place]. Doxycycline tablets or capsules What is this medicine? DOXYCYCLINE (dox i SYE kleen) is a tetracycline antibiotic. It kills certain bacteria or stops their growth. It is used to treat many kinds of infections, like dental, skin, respiratory, and urinary tract infections. It also treats acne, Lyme disease, malaria, and certain sexually transmitted infections. This medicine may be used for other purposes; ask your health care provider or pharmacist if you have questions. COMMON BRAND NAME(S): Acticlate, Adoxa, Adoxa CK, Adoxa Pak, Adoxa TT, Alodox, Avidoxy, Doxal, LYMEPAK, Mondoxyne NL, Monodox, Morgidox 1x, Morgidox 1x Kit, Morgidox 2x, Morgidox 2x Kit, NutriDox, Ocudox, [place], [place], [place], Vibra-Tabs, Vibramycin What should I tell my health care provider before I take this medicine? They need to know if you have any of these  conditions:  liver disease  long exposure to sunlight like working outdoors  stomach problems like colitis  an unusual or allergic reaction to doxycycline, tetracycline antibiotics, other medicines, foods, dyes, or preservatives  pregnant or trying to get pregnant  breast-feeding How should I use this medicine? Take this medicine by mouth with a full glass of water. Follow the directions on the prescription label. It is best to take this medicine without food, but if it upsets your stomach take it with food. Take your medicine at regular intervals. Do not take your medicine more often than directed. Take all of your medicine as directed even if you think you are better. Do not skip doses or stop your medicine early. Talk to your pediatrician regarding the use of this medicine in children. While this drug may be prescribed for selected conditions, precautions do apply. Overdosage: If you think you have taken too much of this medicine contact a poison control center or emergency room at once. NOTE:  This medicine is only for you. Do not share this medicine with others. What if I miss a dose? If you miss a dose, take it as soon as you can. If it is almost time for your next dose, take only that dose. Do not take double or extra doses. What may interact with this medicine?  antacids  barbiturates  birth control pills  bismuth subsalicylate  carbamazepine  methoxyflurane  other antibiotics  phenytoin  vitamins that contain iron  warfarin This list may not describe all possible interactions. Give your health care provider a list of all the medicines, herbs, non-prescription drugs, or dietary supplements you use. Also tell them if you smoke, drink alcohol, or use illegal drugs. Some items may interact with your medicine. What should I watch for while using this medicine? Tell your doctor or health care professional if your symptoms do not improve. Do not treat diarrhea with over  the counter products. Contact your doctor if you have diarrhea that lasts more than 2 days or if it is severe and watery. Do not take this medicine just before going to bed. It may not dissolve properly when you lay down and can cause pain in your throat. Drink plenty of fluids while taking this medicine to also help reduce irritation in your throat. This medicine can make you more sensitive to the sun. Keep out of the sun. If you cannot avoid being in the sun, wear protective clothing and use sunscreen. Do not use sun lamps or tanning beds/booths. Birth control pills may not work properly while you are taking this medicine. Talk to your doctor about using an extra method of birth control. If you are being treated for a sexually transmitted infection, avoid sexual contact until you have finished your treatment. Your sexual partner may also need treatment. Avoid antacids, aluminum, calcium, magnesium, and iron products for 4 hours before and 2 hours after taking a dose of this medicine. If you are using this medicine to prevent malaria, you should still protect yourself from contact with mosquitos. Stay in screened-in areas, use mosquito nets, keep your body covered, and use an insect repellent. What side effects may I notice from receiving this medicine? Side effects that you should report to your doctor or health care professional as soon as possible:  allergic reactions like skin rash, itching or hives, swelling of the face, lips, or tongue  difficulty breathing  fever  itching in the rectal or genital area  pain on swallowing  rash, fever, and swollen lymph nodes  redness, blistering, peeling or loosening of the skin, including inside the mouth  severe stomach pain or cramps  unusual bleeding or bruising  unusually weak or tired  yellowing of the eyes or skin Side effects that usually do not require medical attention (report to your doctor or health care professional if they  continue or are bothersome):  diarrhea  loss of appetite  nausea, vomiting This list may not describe all possible side effects. Call your doctor for medical advice about side effects. You may report side effects to FDA at 1-800-FDA-1088. Where should I keep my medicine? Keep out of the reach of children. Store at room temperature, below 30 degrees C (86 degrees F). Protect from light. Keep container tightly closed. Throw away any unused medicine after the expiration date. Taking this medicine after the expiration date can make you seriously ill. NOTE: This sheet is a summary. It may not cover all possible information. If you have questions  about this medicine, talk to your doctor, pharmacist, or health care provider.  2021 Elsevier/Gold Standard (2018-10-14 13:44:53)

## 2020-10-19 NOTE — Progress Notes (Signed)
Chest x ray is within normal limits.

## 2020-10-20 LAB — VITAMIN B12: Vitamin B-12: 458 pg/mL (ref 232–1245)

## 2020-10-20 LAB — CBC WITH DIFFERENTIAL/PLATELET
Basophils Absolute: 0 10*3/uL (ref 0.0–0.2)
Basos: 0 %
EOS (ABSOLUTE): 0.1 10*3/uL (ref 0.0–0.4)
Eos: 2 %
Hematocrit: 40 % (ref 34.0–46.6)
Hemoglobin: 13.3 g/dL (ref 11.1–15.9)
Immature Grans (Abs): 0.1 10*3/uL (ref 0.0–0.1)
Immature Granulocytes: 1 %
Lymphocytes Absolute: 2.6 10*3/uL (ref 0.7–3.1)
Lymphs: 35 %
MCH: 30.4 pg (ref 26.6–33.0)
MCHC: 33.3 g/dL (ref 31.5–35.7)
MCV: 91 fL (ref 79–97)
Monocytes Absolute: 0.5 10*3/uL (ref 0.1–0.9)
Monocytes: 7 %
Neutrophils Absolute: 4.2 10*3/uL (ref 1.4–7.0)
Neutrophils: 55 %
Platelets: 297 10*3/uL (ref 150–450)
RBC: 4.38 x10E6/uL (ref 3.77–5.28)
RDW: 12.5 % (ref 11.7–15.4)
WBC: 7.5 10*3/uL (ref 3.4–10.8)

## 2020-10-20 LAB — COMPREHENSIVE METABOLIC PANEL
ALT: 23 IU/L (ref 0–32)
AST: 16 IU/L (ref 0–40)
Albumin/Globulin Ratio: 2.1 (ref 1.2–2.2)
Albumin: 4.6 g/dL (ref 3.6–4.6)
Alkaline Phosphatase: 85 IU/L (ref 44–121)
BUN/Creatinine Ratio: 17 (ref 12–28)
BUN: 14 mg/dL (ref 8–27)
Bilirubin Total: 0.4 mg/dL (ref 0.0–1.2)
CO2: 26 mmol/L (ref 20–29)
Calcium: 9.3 mg/dL (ref 8.7–10.3)
Chloride: 100 mmol/L (ref 96–106)
Creatinine, Ser: 0.83 mg/dL (ref 0.57–1.00)
Globulin, Total: 2.2 g/dL (ref 1.5–4.5)
Glucose: 90 mg/dL (ref 65–99)
Potassium: 4.4 mmol/L (ref 3.5–5.2)
Sodium: 142 mmol/L (ref 134–144)
Total Protein: 6.8 g/dL (ref 6.0–8.5)
eGFR: 67 mL/min/{1.73_m2} (ref >59)

## 2020-10-22 LAB — URINE CULTURE

## 2020-10-22 NOTE — Progress Notes (Signed)
Labs look good. Urine has slight  mixed normal flora in urine. B12 ok.  Ok to still do multivitamin without iron.  Increase hydration. Complete antibiotic prescribe. \Keep follow up and return sooner if no improvement at anytime.

## 2020-10-24 ENCOUNTER — Telehealth: Payer: Self-pay

## 2020-10-24 NOTE — Telephone Encounter (Signed)
I called patient and patient verbalized understanding of information below.

## 2020-10-24 NOTE — Telephone Encounter (Signed)
-----   Message from Doreen Beam, [place] sent at 10/22/2020 10:06 AM EDT ----- Labs look good. Urine has slight  mixed normal flora in urine. B12 ok.  Ok to still do multivitamin without iron.  Increase hydration. Complete antibiotic prescribe. \Keep follow up and return sooner if no improvement at anytime.

## 2020-10-25 ENCOUNTER — Ambulatory Visit (INDEPENDENT_AMBULATORY_CARE_PROVIDER_SITE_OTHER): Payer: Medicare Other | Admitting: Adult Health

## 2020-10-25 ENCOUNTER — Other Ambulatory Visit: Payer: Self-pay

## 2020-10-25 ENCOUNTER — Ambulatory Visit: Payer: Self-pay | Admitting: Adult Health

## 2020-10-25 VITALS — BP 130/60 | HR 57 | Temp 98.3°F | Resp 15 | Wt 115.6 lb

## 2020-10-25 DIAGNOSIS — R5383 Other fatigue: Secondary | ICD-10-CM

## 2020-10-25 DIAGNOSIS — E039 Hypothyroidism, unspecified: Secondary | ICD-10-CM

## 2020-10-25 NOTE — Patient Instructions (Signed)

## 2020-10-25 NOTE — Progress Notes (Signed)
Established patient visit   Patient: Shannon Mckinney   DOB: 12-28-1928   87 y.o. Female  MRN: [id number] Visit Date: 10/25/2020  Today's healthcare provider: Marcille Buffy, FNP   Chief Complaint  Patient presents with  . Follow-up   Subjective    HPI  Follow up for Altered Mental Status  The patient was last seen for this 1 weeks ago. Changes made at last visit include ordering labs, chest x-ray and sending urine for culture. Patient was treated with doxycycline 100mg  and Cortisporin, patient was instructed to increase hydration.  Sinuses are much improved asa well as ear. She reports good compliance with treatment. She feels that condition is Improved. She is having side effects. Patient states that she has had GI upset on antibiotic and states that she has no symptoms now.  Lost husband in July says she "just doesn't eat as much living a lone " Patient  denies any fever, body aches,chills, rash, chest pain, shortness of breath, nausea, vomiting, or diarrhea.  Denies dizziness, lightheadedness, pre syncopal or syncopal episodes.    -----------------------------------------------------------------------------------------      Medications: Outpatient Medications Prior to Visit  Medication Sig  . doxycycline (VIBRA-TABS) 100 MG tablet Take 1 tablet (100 mg total) by mouth 2 (two) times daily.  Marland Kitchen levothyroxine (SYNTHROID) 75 MCG tablet Take 1 tablet (75 mcg total) by mouth every other day.  . levothyroxine (SYNTHROID) 88 MCG tablet Take 1 tablet (88 mcg total) by mouth every other day.  . neomycin-polymyxin-hydrocortisone (CORTISPORIN) OTIC solution Place 3 drops into the left ear 4 (four) times daily.  Marland Kitchen Apoaequorin (PREVAGEN PO) Take by mouth daily. (Patient not taking: No sig reported)   No facility-administered medications prior to visit.    Review of Systems  Constitutional: Positive for fatigue.  HENT: Negative.   Respiratory: Negative.    Cardiovascular: Negative.   Gastrointestinal: Negative.   Musculoskeletal: Negative.        Objective    BP 130/60   Pulse (!) 57   Temp 98.3 F (36.8 C) (Oral)   Resp 15   Wt 115 lb 9.6 oz (52.4 kg)   SpO2 99%   BMI 21.14 kg/m     Physical Exam Vitals reviewed.  Constitutional:      Appearance: Normal appearance. She is not ill-appearing or diaphoretic.  HENT:     Head: Normocephalic and atraumatic.     Right Ear: Tympanic membrane, ear canal and external ear normal. There is no impacted cerumen.     Left Ear: Tympanic membrane, ear canal and external ear normal. There is no impacted cerumen.     Nose: Nose normal. No congestion or rhinorrhea.     Mouth/Throat:     Mouth: Mucous membranes are moist.     Pharynx: No oropharyngeal exudate or posterior oropharyngeal erythema.  Eyes:     General: No scleral icterus.       Right eye: No discharge.        Left eye: No discharge.     Conjunctiva/sclera: Conjunctivae normal.  Cardiovascular:     Rate and Rhythm: Normal rate and regular rhythm.     Pulses: Normal pulses.  Pulmonary:     Effort: Pulmonary effort is normal.     Breath sounds: Normal breath sounds.  Abdominal:     General: There is no distension.     Palpations: Abdomen is soft.     Tenderness: There is no abdominal tenderness.  Musculoskeletal:  General: Normal range of motion.     Right lower leg: No edema.     Left lower leg: No edema.  Skin:    Findings: No erythema or rash.  Neurological:     Mental Status: She is alert and oriented to person, place, and time.  Psychiatric:        Mood and Affect: Mood normal.        Behavior: Behavior normal.        Thought Content: Thought content normal.        Judgment: Judgment normal.    No results found for any visits on 10/25/20.  Assessment & Plan     1. Hypothyroidism, unspecified type - TSH - Albumin  2. Fatigue, unspecified type - TSH - Albumin  Return in about 2 weeks (around  11/08/2020), or if symptoms worsen or fail to improve, for at any time for any worsening symptoms, Go to Emergency room/ urgent care if worse.      Red Flags discussed. The patient was given clear instructions to go to ER or return to medical center if any red flags develop, symptoms do not improve, worsen or new problems develop. They verbalized understanding.   The entirety of the information documented in the History of Present Illness, Review of Systems and Physical Exam were personally obtained by me. Portions of this information were initially documented by the CMA and reviewed by me for thoroughness and accuracy.      Marcille Buffy, [place] [phone] (phone) [phone] (fax)  [place]

## 2020-10-28 ENCOUNTER — Encounter: Payer: Self-pay | Admitting: Adult Health

## 2020-10-29 DIAGNOSIS — R5383 Other fatigue: Secondary | ICD-10-CM | POA: Diagnosis not present

## 2020-10-29 DIAGNOSIS — E039 Hypothyroidism, unspecified: Secondary | ICD-10-CM | POA: Diagnosis not present

## 2020-10-30 LAB — ALBUMIN: Albumin: 4.4 g/dL (ref 3.6–4.6)

## 2020-10-30 LAB — TSH: TSH: 1.06 u[IU]/mL (ref 0.450–4.500)

## 2020-10-30 NOTE — Progress Notes (Signed)
TSH and albumin within normal limits.

## 2020-11-30 ENCOUNTER — Encounter: Payer: Self-pay | Admitting: Family Medicine

## 2020-11-30 ENCOUNTER — Ambulatory Visit (INDEPENDENT_AMBULATORY_CARE_PROVIDER_SITE_OTHER): Payer: Medicare Other | Admitting: Family Medicine

## 2020-11-30 ENCOUNTER — Telehealth: Payer: Self-pay

## 2020-11-30 ENCOUNTER — Other Ambulatory Visit: Payer: Self-pay

## 2020-11-30 VITALS — BP 147/64 | HR 64 | Temp 97.6°F | Wt 116.3 lb

## 2020-11-30 DIAGNOSIS — E890 Postprocedural hypothyroidism: Secondary | ICD-10-CM

## 2020-11-30 DIAGNOSIS — E119 Type 2 diabetes mellitus without complications: Secondary | ICD-10-CM

## 2020-11-30 LAB — POCT GLYCOSYLATED HEMOGLOBIN (HGB A1C)
Est. average glucose Bld gHb Est-mCnc: 114
Hemoglobin A1C: 5.6 % (ref 4.0–5.6)

## 2020-11-30 NOTE — Telephone Encounter (Signed)
-----   Message from Virginia Crews, MD sent at 11/30/2020  9:26 AM EDT ----- Normal A1c.

## 2020-11-30 NOTE — Telephone Encounter (Signed)
Patient and son Fritz Pickerel) was advised.

## 2020-11-30 NOTE — Assessment & Plan Note (Signed)
Previously well controlled Not currently on any medications Recheck A1c Follow-up in 6 months Continue diet exercise

## 2020-11-30 NOTE — Assessment & Plan Note (Signed)
Well-controlled on recent TSH 1 month ago Continue Synthroid at current dose No changes necessary

## 2020-11-30 NOTE — Addendum Note (Signed)
Addended by: Casimer Leek C on: 11/30/2020 09:01 AM   Modules accepted: Orders

## 2020-11-30 NOTE — Progress Notes (Signed)
Established patient visit   Patient: Shannon Mckinney   DOB: 1928-11-20   87 y.o. Female  MRN: [id number] Visit Date: 11/30/2020  Today's healthcare provider: Lavon Paganini, MD   Chief Complaint  Patient presents with  . Hypothyroidism   Subjective    HPI  Hypothyroid, follow-up  Lab Results  Component Value Date   TSH 1.060 10/29/2020   TSH 0.342 (L) 06/28/2020   TSH 3.870 02/13/2020   Wt Readings from Last 3 Encounters:  11/30/20 116 lb 4.8 oz (52.8 kg)  10/25/20 115 lb 9.6 oz (52.4 kg)  10/19/20 118 lb 3.2 oz (53.6 kg)    She was last seen for hypothyroid 1 months ago.  Management since that visit includes no changes. She reports good compliance with treatment. She is not having side effects.   Symptoms: No change in energy level No constipation  No diarrhea No heat / cold intolerance  No nervousness No palpitations  No weight changes    Hypertension Her blood pressure is slightly elevated. She states that she is not concerned about this at this time because it tends to rise when she has doctor visits.  -----------------------------------------------------------------------------------------   Social History   Tobacco Use  . Smoking status: Never Smoker  . Smokeless tobacco: Never Used  Vaping Use  . Vaping Use: Never used  Substance Use Topics  . Alcohol use: Yes    Alcohol/week: 0.0 standard drinks    Comment: rare glass of wine  . Drug use: No       Medications: Outpatient Medications Prior to Visit  Medication Sig  . Apoaequorin (PREVAGEN PO) Take by mouth daily.  Marland Kitchen levothyroxine (SYNTHROID) 75 MCG tablet Take 1 tablet (75 mcg total) by mouth every other day.  . levothyroxine (SYNTHROID) 88 MCG tablet Take 1 tablet (88 mcg total) by mouth every other day.  . neomycin-polymyxin-hydrocortisone (CORTISPORIN) OTIC solution Place 3 drops into the left ear 4 (four) times daily.  . [DISCONTINUED] doxycycline (VIBRA-TABS) 100 MG tablet Take 1  tablet (100 mg total) by mouth 2 (two) times daily. (Patient not taking: Reported on 11/30/2020)   No facility-administered medications prior to visit.    Review of Systems  Constitutional: Negative.  Negative for chills, fatigue and fever.  HENT: Negative for congestion, ear pain, rhinorrhea, sinus pain and sore throat.   Respiratory: Negative.  Negative for cough, shortness of breath and wheezing.   Cardiovascular: Negative for chest pain and leg swelling.  Gastrointestinal: Negative for abdominal pain, blood in stool, diarrhea, nausea and vomiting.  Genitourinary: Negative for dysuria, flank pain, frequency and urgency.  Neurological: Negative for dizziness and headaches.  Hematological: Negative.        Objective    BP (!) 147/64 (BP Location: Right Arm, Patient Position: Sitting, Cuff Size: Normal)   Pulse 64   Temp 97.6 F (36.4 C) (Oral)   Wt 116 lb 4.8 oz (52.8 kg)   SpO2 98%   BMI 21.27 kg/m     Physical Exam Vitals reviewed.  Constitutional:      General: She is not in acute distress.    Appearance: Normal appearance. She is well-developed. She is not diaphoretic.  HENT:     Head: Normocephalic and atraumatic.  Eyes:     General: No scleral icterus.    Conjunctiva/sclera: Conjunctivae normal.  Neck:     Thyroid: No thyromegaly.  Cardiovascular:     Rate and Rhythm: Normal rate and regular rhythm.  Pulses: Normal pulses.     Heart sounds: Normal heart sounds. No murmur heard.   Pulmonary:     Effort: Pulmonary effort is normal. No respiratory distress.     Breath sounds: Normal breath sounds. No wheezing, rhonchi or rales.  Musculoskeletal:     Cervical back: Neck supple.     Right lower leg: No edema.     Left lower leg: No edema.  Lymphadenopathy:     Cervical: No cervical adenopathy.  Skin:    General: Skin is warm and dry.     Findings: No rash.  Neurological:     Mental Status: She is alert and oriented to person, place, and time. Mental  status is at baseline.     Motor: Tremor present.  Psychiatric:        Mood and Affect: Mood normal.        Behavior: Behavior normal.      No results found for any visits on 11/30/20.  Assessment & Plan     Problem List Items Addressed This Visit      Endocrine   Diabetes mellitus ([place])    Previously well controlled Not currently on any medications Recheck A1c Follow-up in 6 months Continue diet exercise      Postablative hypothyroidism - Primary    Well-controlled on recent TSH 1 month ago Continue Synthroid at current dose No changes necessary          Return in about 7 months (around 07/02/2021) for AWV, chronic disease f/u.      Frederic Jericho [place],acting as a Education administrator for Lavon Paganini, MD.,have documented all relevant documentation on the behalf of Lavon Paganini, MD,as directed by  Lavon Paganini, MD while in the presence of Lavon Paganini, MD.  I, Lavon Paganini, MD, have reviewed all documentation for this visit. The documentation on 11/30/20 for the exam, diagnosis, procedures, and orders are all accurate and complete.   Alenna Russell, Dionne Bucy, MD, MPH [facility]

## 2020-12-31 ENCOUNTER — Ambulatory Visit: Payer: Self-pay | Admitting: Family Medicine

## 2021-01-22 DIAGNOSIS — E119 Type 2 diabetes mellitus without complications: Secondary | ICD-10-CM | POA: Diagnosis not present

## 2021-01-22 LAB — HM DIABETES EYE EXAM

## 2021-01-26 DIAGNOSIS — Z20822 Contact with and (suspected) exposure to covid-19: Secondary | ICD-10-CM | POA: Diagnosis not present

## 2021-02-04 ENCOUNTER — Encounter: Payer: Self-pay | Admitting: Family Medicine

## 2021-02-25 ENCOUNTER — Ambulatory Visit: Payer: Self-pay

## 2021-02-25 NOTE — Telephone Encounter (Signed)
Per Agent: Summary: FU call on brusing worsing on ankles   Friend/helper of pt has called in due to concern of pt ankles having a lot of bruising and worsening. She wanted to talk to the nurse as her veins are looking swollen. She is not on DPR. She however seems quite friendly with pt and pt stated she was speaking for her. She states in presence that she is always the one who takes her to her appts.  Shannon Mckinney states she will stay with pt until they receive a call back. Her # is [phone]. States the pt will give permission for conversation.     Three attempts to reach pt. At number provided. Left message each time to CB.

## 2021-02-27 NOTE — Telephone Encounter (Signed)
Left message to call back. 4 attempts to contact patient and was unsuccessful.

## 2021-03-19 ENCOUNTER — Ambulatory Visit (INDEPENDENT_AMBULATORY_CARE_PROVIDER_SITE_OTHER): Payer: Medicare Other | Admitting: Family Medicine

## 2021-03-19 ENCOUNTER — Encounter: Payer: Self-pay | Admitting: Family Medicine

## 2021-03-19 ENCOUNTER — Other Ambulatory Visit: Payer: Self-pay

## 2021-03-19 VITALS — BP 125/60 | HR 66 | Temp 98.3°F | Resp 16 | Wt 115.6 lb

## 2021-03-19 DIAGNOSIS — I872 Venous insufficiency (chronic) (peripheral): Secondary | ICD-10-CM | POA: Diagnosis not present

## 2021-03-19 DIAGNOSIS — I499 Cardiac arrhythmia, unspecified: Secondary | ICD-10-CM

## 2021-03-19 DIAGNOSIS — R011 Cardiac murmur, unspecified: Secondary | ICD-10-CM

## 2021-03-19 DIAGNOSIS — E119 Type 2 diabetes mellitus without complications: Secondary | ICD-10-CM

## 2021-03-19 DIAGNOSIS — L400 Psoriasis vulgaris: Secondary | ICD-10-CM | POA: Diagnosis not present

## 2021-03-19 MED ORDER — CLOBETASOL PROPIONATE 0.05 % EX OINT: 1.0000 "application " | TOPICAL_OINTMENT | Freq: Two times a day (BID) | CUTANEOUS | 0 refills | Status: DC

## 2021-03-19 NOTE — Assessment & Plan Note (Signed)
EKG done; AV block noted Refer back to cards

## 2021-03-19 NOTE — Assessment & Plan Note (Addendum)
R ankle, inner aspect, near heel Non blanchable No temperature change noted Thought was allergic component- denies known cause  Reports frequent poison oak rash throughout lifetime Oblong size 3x1x.1

## 2021-03-19 NOTE — Assessment & Plan Note (Signed)
Diet controlled Denies neuropathy

## 2021-03-19 NOTE — Progress Notes (Signed)
Established patient visit   Patient: Shannon Mckinney   DOB: 19-May-1929   87 y.o. Female  MRN: [id number] Visit Date: 03/19/2021  Today's healthcare provider: Gwyneth Sprout, FNP   Chief Complaint  Patient presents with   Skin Problem    Patient comes in office today with her care giver who reports for the past 4 weeks she has noticed redness in patients lower extremities. Patient denies pain, or swelling.   Subjective  -------------------------------------------------------------------------------------------------------------------- HPI HPI     Skin Problem    Additional comments: Patient comes in office today with her care giver who reports for the past 4 weeks she has noticed redness in patients lower extremities. Patient denies pain, or swelling.      Last edited by Minette Headland, CMA on 03/19/2021  1:12 PM.        Patient Active Problem List   Diagnosis Date Noted   Plaque psoriasis 03/19/2021   Venous insufficiency of both lower extremities 03/19/2021   Irregular heart rhythm 03/19/2021   Fatigue 10/19/2020   Vitamin D deficiency 06/08/2016   S/P arthroscopy of knee 11/03/2015   Murmur 08/21/2015   Postablative hypothyroidism 04/27/2014   Thyroid nodule 02/05/2014   Osteopenia 08/09/2012   Diabetes mellitus ([place]) 08/04/2012   Hypercholesterolemia 08/04/2012   Past Medical History:  Diagnosis Date   Allergy    Arthritis    Benign head tremor    Diabetes mellitus ([place])    diet controlled   Fibrocystic breast disease    Heart murmur    History of abnormal Pap smear    Hx of basal cell carcinoma 11/06/2006   R nasolabial, excised 11/24/2006   Hx of basal cell carcinoma 04/10/2009   Mid nasal dorsum   Hx of basal cell carcinoma 01/26/2017   Top of L shoulder   Hypercholesterolemia    Hyperthyroidism    s/p ablation   Squamous cell carcinoma of skin 01/21/2016   R temple above lat brow   Squamous cell carcinoma of skin 06/23/2016   L upper lip,  excised 08/04/2016   Allergies  Allergen Reactions   Mysoline [Primidone] Other (See Comments)    "unknown"   Penicillins Rash       Medications: Outpatient Medications Prior to Visit  Medication Sig   levothyroxine (SYNTHROID) 75 MCG tablet Take 1 tablet (75 mcg total) by mouth every other day.   levothyroxine (SYNTHROID) 88 MCG tablet Take 1 tablet (88 mcg total) by mouth every other day.   Apoaequorin (PREVAGEN PO) Take by mouth daily. (Patient not taking: Reported on 03/19/2021)   neomycin-polymyxin-hydrocortisone (CORTISPORIN) OTIC solution Place 3 drops into the left ear 4 (four) times daily. (Patient not taking: Reported on 03/19/2021)   No facility-administered medications prior to visit.    Review of Systems     Objective  -------------------------------------------------------------------------------------------------------------------- BP 125/60   Pulse 66   Temp 98.3 F (36.8 C) (Oral)   Resp 16   Wt 115 lb 9.6 oz (52.4 kg)   SpO2 97%   BMI 21.14 kg/m     Physical Exam Vitals and nursing note reviewed.  Constitutional:      General: She is not in acute distress.    Appearance: Normal appearance. She is normal weight. She is not ill-appearing, toxic-appearing or diaphoretic.  Eyes:     Extraocular Movements: Extraocular movements intact.  Cardiovascular:     Rate and Rhythm: Normal rate. Rhythm irregular.     Pulses: Normal pulses.  Heart sounds: Murmur heard.    No friction rub. No gallop.  Pulmonary:     Effort: Pulmonary effort is normal. No respiratory distress.     Breath sounds: Normal breath sounds. No stridor. No wheezing, rhonchi or rales.  Chest:     Chest wall: No tenderness.  Abdominal:     Palpations: Abdomen is soft.  Musculoskeletal:        General: No swelling, tenderness, deformity or signs of injury. Normal range of motion.     Cervical back: Normal range of motion.     Right lower leg: No edema.     Left lower leg: No edema.   Skin:    General: Skin is warm and dry.     Capillary Refill: Capillary refill takes less than 2 seconds.     Findings: Erythema and rash present. No lesion. Rash is macular and scaling. Rash is not crusting, nodular, papular, purpuric, pustular or urticarial.     Nails: There is no clubbing.          Comments: 3 x1 x 0.1 macular rash with slight scaling  Neurological:     General: No focal deficit present.     Mental Status: She is alert and oriented to person, place, and time.     Sensory: No sensory deficit.     Motor: No weakness.     Gait: Gait normal.  Psychiatric:        Attention and Perception: Attention normal.        Mood and Affect: Mood and affect normal.        Speech: Speech normal.        Behavior: Behavior normal. Behavior is cooperative.        Thought Content: Thought content normal.        Cognition and Memory: Cognition normal.        Judgment: Judgment normal.     Comments: Tremor noted      No results found for any visits on 03/19/21.  Assessment & Plan  ---------------------------------------------------------------------------------------------------------------------- Problem List Items Addressed This Visit       Cardiovascular and Mediastinum   Venous insufficiency of both lower extremities - Primary    Generalized mottling of BLE c/w venous insufficiency  Has waxed/waned over past 4 weeks No edema noted today Denies changes in sensation Pulses intact PVLs ordered given presentation hx      Relevant Orders   EKG 12-Lead (Completed)   US Venous Img Lower Bilateral     Endocrine   Diabetes mellitus ([place])    Diet controlled Denies neuropathy         Musculoskeletal and Integument   Plaque psoriasis    R ankle, inner aspect, near heel Non blanchable No temperature change noted Thought was allergic component- denies known cause  Reports frequent poison oak rash throughout lifetime Oblong size 3x1x.1      Relevant Medications    clobetasol ointment (TEMOVATE) 0.05 %     Other   Murmur    Murmur previously noted; worse on exam today 2/6      Relevant Orders   Ambulatory referral to Cardiology   Irregular heart rhythm    EKG done; AV block noted Refer back to cards      Relevant Orders   Ambulatory referral to Cardiology     Return if symptoms worsen or fail to improve. Referral placed for cardiology as well as [place] of BLEs.      Vonna Kotyk,  FNP, have reviewed all documentation for this visit. The documentation on 03/19/21 for the exam, diagnosis, procedures, and orders are all accurate and complete.    Gwyneth Sprout, [place] [phone] (phone) [phone] (fax)  [place]

## 2021-03-19 NOTE — Assessment & Plan Note (Signed)
Murmur previously noted; worse on exam today 2/6

## 2021-03-19 NOTE — Assessment & Plan Note (Addendum)
Generalized mottling of BLE c/w venous insufficiency  Has waxed/waned over past 4 weeks No edema noted today Denies changes in sensation Pulses intact PVLs ordered given presentation hx

## 2021-04-16 ENCOUNTER — Encounter: Payer: Self-pay | Admitting: Family Medicine

## 2021-04-16 ENCOUNTER — Other Ambulatory Visit: Payer: Self-pay

## 2021-04-16 ENCOUNTER — Ambulatory Visit (INDEPENDENT_AMBULATORY_CARE_PROVIDER_SITE_OTHER): Payer: Medicare Other | Admitting: Family Medicine

## 2021-04-16 VITALS — BP 123/67 | HR 67 | Temp 98.1°F | Resp 18 | Ht 62.0 in | Wt 117.0 lb

## 2021-04-16 DIAGNOSIS — E890 Postprocedural hypothyroidism: Secondary | ICD-10-CM | POA: Diagnosis not present

## 2021-04-16 DIAGNOSIS — E559 Vitamin D deficiency, unspecified: Secondary | ICD-10-CM

## 2021-04-16 DIAGNOSIS — L853 Xerosis cutis: Secondary | ICD-10-CM

## 2021-04-16 DIAGNOSIS — R350 Frequency of micturition: Secondary | ICD-10-CM

## 2021-04-16 DIAGNOSIS — N390 Urinary tract infection, site not specified: Secondary | ICD-10-CM | POA: Diagnosis not present

## 2021-04-16 LAB — POCT URINALYSIS DIPSTICK
Bilirubin, UA: NEGATIVE
Blood, UA: NEGATIVE
Glucose, UA: NEGATIVE
Ketones, UA: NEGATIVE
Nitrite, UA: NEGATIVE
Protein, UA: POSITIVE — AB
Spec Grav, UA: 1.02 (ref 1.010–1.025)
Urobilinogen, UA: 0.2 E.U./dL
pH, UA: 6.5 (ref 5.0–8.0)

## 2021-04-16 MED ORDER — NITROFURANTOIN MONOHYD MACRO 100 MG PO CAPS: 100.0000 mg | ORAL_CAPSULE | Freq: Two times a day (BID) | ORAL | 0 refills | Status: DC

## 2021-04-16 NOTE — Progress Notes (Signed)
I,Shannon Mckinney,acting as a scribe for Shannon Durie, MD.,have documented all relevant documentation on the behalf of Shannon Durie, MD,as directed by  Shannon Durie, MD while in the presence of Shannon Durie, MD.   Established patient visit   Patient: Shannon Mckinney   DOB: October 19, 1928   87 y.o. Female  MRN: [id number] Visit Date: 04/16/2021  Today's healthcare provider: [name], MD   Chief Complaint  Patient presents with   Urinary Frequency   Subjective    Urinary Frequency  This is a recurrent problem. The current episode started more than 1 month ago (2-3 months). The problem occurs intermittently. The problem has been unchanged. The patient is experiencing no pain. There has been no fever. Associated symptoms include frequency. Pertinent negatives include no chills, discharge, flank pain, hematuria, hesitancy, nausea, possible pregnancy, sweats, urgency or vomiting. She has tried nothing for the symptoms. The treatment provided no relief.     Patient has had increased urination for 3-4 months. Patient states she has to go to the bathroom every couple of hours. Per caretaker, patient does not drink enough water.  Patient has very dry skin.  The skin dryness mainly bothers her on her arms.  She has no rash and no other symptoms.    Medications: Outpatient Medications Prior to Visit  Medication Sig   levothyroxine (SYNTHROID) 75 MCG tablet Take 1 tablet (75 mcg total) by mouth every other day.   levothyroxine (SYNTHROID) 88 MCG tablet Take 1 tablet (88 mcg total) by mouth every other day.   Apoaequorin (PREVAGEN PO) Take by mouth daily. (Patient not taking: No sig reported)   clobetasol ointment (TEMOVATE) 5.00 % Apply 1 application topically 2 (two) times daily. (Patient not taking: Reported on 04/16/2021)   neomycin-polymyxin-hydrocortisone (CORTISPORIN) OTIC solution Place 3 drops into the left ear 4 (four) times daily. (Patient not taking: Reported  on 04/16/2021)   No facility-administered medications prior to visit.    Review of Systems  Constitutional:  Negative for appetite change, chills, fatigue and fever.  Respiratory:  Negative for chest tightness and shortness of breath.   Cardiovascular:  Negative for chest pain and palpitations.  Gastrointestinal:  Negative for abdominal pain, nausea and vomiting.  Genitourinary:  Positive for frequency. Negative for flank pain, hematuria, hesitancy and urgency.  Neurological:  Negative for dizziness and weakness.       Objective    BP 123/67 (BP Location: Right Arm, Patient Position: Sitting, Cuff Size: Normal)   Pulse 67   Temp 98.1 F (36.7 C) (Oral)   Resp 18   Ht 5\' 2"  (1.575 m)   Wt 117 lb (53.1 kg)   SpO2 97%   BMI 21.40 kg/m  BP Readings from Last 3 Encounters:  04/16/21 123/67  03/19/21 125/60  11/30/20 (!) 147/64   Wt Readings from Last 3 Encounters:  04/16/21 117 lb (53.1 kg)  03/19/21 115 lb 9.6 oz (52.4 kg)  11/30/20 116 lb 4.8 oz (52.8 kg)      Physical Exam    Results for orders placed or performed in visit on 04/16/21  CULTURE, URINE COMPREHENSIVE   Specimen: Urine   Urine  Result Value Ref Range   Urine Culture, Comprehensive Final report    Organism ID, Bacteria Comment   POCT urinalysis dipstick  Result Value Ref Range   Color, UA Yellow    Clarity, UA Slighty Coudy    Glucose, UA Negative Negative   Bilirubin, UA  Negative    Ketones, UA Negative    Spec Grav, UA 1.020 1.010 - 1.025   Blood, UA Negative    pH, UA 6.5 5.0 - 8.0   Protein, UA Positive (A) Negative   Urobilinogen, UA 0.2 0.2 or 1.0 E.U./dL   Nitrite, UA Negative    Leukocytes, UA Moderate (2+) (A) Negative    Assessment & Plan     1. Frequency of urination This not infection I do not believe it is worth a urology work-up without other symptoms.  Recommend depends - POCT urinalysis dipstick--Abnormal  2. Urinary tract infection without hematuria, site  unspecified Treat as infection. - CULTURE, URINE COMPREHENSIVE - nitrofurantoin, macrocrystal-monohydrate, (MACROBID) 100 MG capsule; Take 1 capsule (100 mg total) by mouth 2 (two) times daily.  Dispense: 10 capsule; Refill: 0  3. Postablative hypothyroidism   4. Vitamin D deficiency   5. Dry skin Eucerin cream. Recommend flu shot this fall. Follow-up in the next few months routine health maintenance issues and chronic medical problems.    No follow-ups on file.      I, Shannon Mckinney, CMA, have reviewed all documentation for this visit. The documentation on 04/24/21 for the exam, diagnosis, procedures, and orders are all accurate and complete.    Nekeya Briski Cranford Mon, MD  [facility] [phone] (phone) [phone] (fax)  [place]

## 2021-04-16 NOTE — Patient Instructions (Signed)
TRY OVER-THE-COUNTER EUCERIN CREAM FOR DRY SKIN.

## 2021-04-19 LAB — CULTURE, URINE COMPREHENSIVE

## 2021-04-30 ENCOUNTER — Encounter: Payer: Medicare Other | Admitting: Dermatology

## 2021-05-03 ENCOUNTER — Ambulatory Visit: Payer: Medicare Other | Admitting: Cardiology

## 2021-05-10 ENCOUNTER — Ambulatory Visit: Payer: Medicare Other | Admitting: Cardiology

## 2021-05-14 ENCOUNTER — Ambulatory Visit: Payer: Medicare Other | Admitting: Cardiology

## 2021-05-17 ENCOUNTER — Other Ambulatory Visit: Payer: Self-pay

## 2021-05-17 ENCOUNTER — Encounter: Payer: Self-pay | Admitting: Cardiology

## 2021-05-17 ENCOUNTER — Ambulatory Visit (INDEPENDENT_AMBULATORY_CARE_PROVIDER_SITE_OTHER): Payer: Medicare Other | Admitting: Cardiology

## 2021-05-17 VITALS — BP 112/60 | HR 65 | Ht 65.0 in | Wt 116.0 lb

## 2021-05-17 DIAGNOSIS — I351 Nonrheumatic aortic (valve) insufficiency: Secondary | ICD-10-CM

## 2021-05-17 DIAGNOSIS — I8312 Varicose veins of left lower extremity with inflammation: Secondary | ICD-10-CM

## 2021-05-17 DIAGNOSIS — I440 Atrioventricular block, first degree: Secondary | ICD-10-CM

## 2021-05-17 DIAGNOSIS — I8311 Varicose veins of right lower extremity with inflammation: Secondary | ICD-10-CM | POA: Diagnosis not present

## 2021-05-17 DIAGNOSIS — R011 Cardiac murmur, unspecified: Secondary | ICD-10-CM

## 2021-05-17 NOTE — Patient Instructions (Signed)
Medication Instructions:  Your physician recommends that you continue on your current medications as directed. Please refer to the Current Medication list given to you today.  *If you need a refill on your cardiac medications before your next appointment, please call your pharmacy*   Lab Work: None ordered If you have labs (blood work) drawn today and your tests are completely normal, you will receive your results only by: [place] (if you have MyChart) OR A paper copy in the mail If you have any lab test that is abnormal or we need to change your treatment, we will call you to review the results.   Testing/Procedures: Your physician has requested that you have an echocardiogram. Echocardiography is a painless test that uses sound waves to create images of your heart. It provides your doctor with information about the size and shape of your heart and how well your heart's chambers and valves are working. This procedure takes approximately one hour. There are no restrictions for this procedure.    Follow-Up: At [facility], you and your health needs are our priority.  As part of our continuing mission to provide you with exceptional heart care, we have created designated Provider Care Teams.  These Care Teams include your primary Cardiologist (physician) and Advanced Practice Providers (APPs -  Physician Assistants and Nurse Practitioners) who all work together to provide you with the care you need, when you need it.  We recommend signing up for the patient portal called "MyChart".  Sign up information is provided on this After Visit Summary.  MyChart is used to connect with patients for Virtual Visits (Telemedicine).  Patients are able to view lab/test results, encounter notes, upcoming appointments, etc.  Non-urgent messages can be sent to your provider as well.   To learn more about what you can do with MyChart, go to [email or web address].    Your next appointment:   After  the echo  The format for your next appointment:   In Person  Provider:   Kate Sable, MD   Other Instructions N/A

## 2021-05-17 NOTE — Progress Notes (Signed)
Cardiology Office Note:    Date:  05/17/2021   ID:  Shannon Mckinney, DOB [birth year], MRN [id number]  PCP:  Virginia Crews, MD   [facility] Providers Cardiologist:  Kate Sable, MD     Referring MD: Gwyneth Sprout, FNP   Chief Complaint  Patient presents with   NEW patient-referred by PCP for eval of irregular rhythm    Patient also reports discoloration around both ankles.      History of Present Illness:    Shannon Mckinney is a 87 y.o. female with a hx of hyperlipidemia, thyroid dysfunction, who presents due a cardiac murmur and first-degree AV block.  Patient saw her primary care provider 03/18/2021, cardiac murmur was noted on exam.  Rhythm also noted to be irregular.  EKG obtained showed first-degree AV block, no other significant abnormalities.  Patient denies symptoms of palpitations, irregular heartbeats, dizziness, syncope.  Last echocardiogram in 2012 showed normal systolic function, EF 24%, mild aortic valve regurgitation.  She denies chest pain, shortness of breath, palpitations.  Has lower extremity varicose veins, occasional swelling when she sits down for too long.  Sometimes has redness in her lower extremities.  Past Medical History:  Diagnosis Date   Allergy    Arthritis    Benign head tremor    Diabetes mellitus (HCC)    diet controlled   Fibrocystic breast disease    Heart murmur    History of abnormal Pap smear    Hx of basal cell carcinoma 11/06/2006   R nasolabial, excised 11/24/2006   Hx of basal cell carcinoma 04/10/2009   Mid nasal dorsum   Hx of basal cell carcinoma 01/26/2017   Top of L shoulder   Hypercholesterolemia    Hyperthyroidism    s/p ablation   Squamous cell carcinoma of skin 01/21/2016   R temple above lat brow   Squamous cell carcinoma of skin 06/23/2016   L upper lip, excised 08/04/2016    Past Surgical History:  Procedure Laterality Date   BREAST BIOPSY  70's   benign   CARPAL TUNNEL RELEASE     right hand    CATARACT EXTRACTION W/PHACO Right 05/09/2020   Procedure: CATARACT EXTRACTION PHACO AND INTRAOCULAR LENS PLACEMENT (IOC) RIGHT DIABETIC 10.17  01:29.9  11.3%;  Surgeon: Leandrew Koyanagi, MD;  Location: [place];  Service: Ophthalmology;  Laterality: Right;   CATARACT EXTRACTION W/PHACO Left 05/30/2020   Procedure: CATARACT EXTRACTION PHACO AND INTRAOCULAR LENS PLACEMENT ([place]) LEFT DIABETIC;  Surgeon: Leandrew Koyanagi, MD;  Location: [place];  Service: Ophthalmology;  Laterality: Left;  12.20 1:24.7 14.4%   KNEE ARTHROSCOPY Left 09/12/2015   Procedure: ARTHROSCOPY LEFT KNEE, PARTIAL MEDIAL MENISECTOMY, PARTIAL LATERAL MENISECTOMY, CHONDROPLASTY MEDIAL AND LATERAL;  Surgeon: Dereck Leep, MD;  Location: [facility];  Service: Orthopedics;  Laterality: Left;   SHOULDER ARTHROSCOPY     TONSILLECTOMY      Current Medications: Current Meds  Medication Sig   levothyroxine (SYNTHROID) 75 MCG tablet Take 1 tablet (75 mcg total) by mouth every other day.   levothyroxine (SYNTHROID) 88 MCG tablet Take 1 tablet (88 mcg total) by mouth every other day.     Allergies:   Mysoline [primidone] and Penicillins   Social History   Socioeconomic History   Marital status: Widowed    Spouse name: Not on file   Number of children: 2   Years of education: Not on file   Highest education level: Bachelor's degree (e.g., BA, AB, BS)  Occupational History   Occupation: retired Arboriculturist   Tobacco Use   Smoking status: Never   Smokeless tobacco: Never  Vaping Use   Vaping Use: Never used  Substance and Sexual Activity   Alcohol use: Yes    Alcohol/week: 0.0 standard drinks    Comment: rare glass of wine   Drug use: No   Sexual activity: Not Currently  Other Topics Concern   Not on file  Social History Narrative   Not on file   Social Determinants of Health   Financial Resource Strain: Low Risk    Difficulty of Paying Living Expenses: Not hard at all  Food  Insecurity: No Food Insecurity   Worried About Charity fundraiser in the Last Year: Never true   [place] in the Last Year: Never true  Transportation Needs: No Transportation Needs   Lack of Transportation (Medical): No   Lack of Transportation (Non-Medical): No  Physical Activity: Inactive   Days of Exercise per Week: 0 days   Minutes of Exercise per Session: 0 min  Stress: No Stress Concern Present   Feeling of Stress : Not at all  Social Connections: Socially Isolated   Frequency of Communication with Friends and Family: More than three times a week   Frequency of Social Gatherings with Friends and Family: More than three times a week   Attends Religious Services: Never   Marine scientist or Organizations: No   Attends Archivist Meetings: Never   Marital Status: Widowed     Family History: The patient's family history includes Diabetes in her brother, brother, father, and sister; Heart disease in her brother, father, and mother. There is no history of Breast cancer or Colon cancer.  ROS:   Please see the history of present illness.     All other systems reviewed and are negative.  EKGs/Labs/Other Studies Reviewed:    The following studies were reviewed today:   EKG:  EKG is  ordered today.  The ekg ordered today demonstrates sinus rhythm, first-degree AV block, possible old septal infarct, low QRS voltage  Recent Labs: 10/19/2020: ALT 23; BUN 14; Creatinine, Ser 0.83; Hemoglobin 13.3; Platelets 297; Potassium 4.4; Sodium 142 10/29/2020: TSH 1.060  Recent Lipid Panel    Component Value Date/Time   CHOL 234 (H) 06/28/2020 1514   TRIG 242 (H) 06/28/2020 1514   HDL 65 06/28/2020 1514   CHOLHDL 3.6 06/28/2020 1514   CHOLHDL 5 06/24/2017 0835   VLDL 34.6 06/24/2017 0835   LDLCALC 127 (H) 06/28/2020 1514   LDLDIRECT 130.6 06/22/2013 0934     Risk Assessment/Calculations:          Physical Exam:    VS:  BP 112/60 (BP Location: Right Arm,  Patient Position: Sitting, Cuff Size: Normal)   Pulse 65   Ht 5\' 5"  (1.651 m)   Wt 116 lb (52.6 kg)   SpO2 96%   BMI 19.30 kg/m     Wt Readings from Last 3 Encounters:  05/17/21 116 lb (52.6 kg)  04/16/21 117 lb (53.1 kg)  03/19/21 115 lb 9.6 oz (52.4 kg)     GEN:  Well nourished, well developed in no acute distress HEENT: Normal NECK: No JVD; No carotid bruits LYMPHATICS: No lymphadenopathy CARDIAC: RRR, 2/6 systolic murmur RESPIRATORY:  Clear to auscultation without rales, wheezing or rhonchi  ABDOMEN: Soft, non-tender, non-distended MUSCULOSKELETAL:  No edema; varicose veins noted, reddish/purplish discoloration noted around ankles. SKIN: Warm and dry NEUROLOGIC:  Alert and oriented x 3 PSYCHIATRIC:  Normal affect   ASSESSMENT:    1. Systolic murmur   2. 1st degree AV block   3. Aortic valve insufficiency, etiology of cardiac valve disease unspecified   4. Varicose veins of both lower extremities with inflammation    PLAN:    In order of problems listed above:  Systolic murmur, get echocardiogram to evaluate any structural/valvular abnormalities. First-degree AV block, patient denies palpitations, dizziness, syncope.  No indication for additional testing, continue to monitor. History of mild aortic valve regurgitation, obtain echo as above to evaluate any significant change. Lower extremity varicose veins, likely has venous insufficiency.  Leg raising, compression stockings advised.  Follow-up after echocardiogram.     Medication Adjustments/Labs and Tests Ordered: Current medicines are reviewed at length with the patient today.  Concerns regarding medicines are outlined above.  Orders Placed This Encounter  Procedures   EKG 12-Lead   ECHOCARDIOGRAM COMPLETE    No orders of the defined types were placed in this encounter.   Patient Instructions  Medication Instructions:  Your physician recommends that you continue on your current medications as directed.  Please refer to the Current Medication list given to you today.  *If you need a refill on your cardiac medications before your next appointment, please call your pharmacy*   Lab Work: None ordered If you have labs (blood work) drawn today and your tests are completely normal, you will receive your results only by: [place] (if you have MyChart) OR A paper copy in the mail If you have any lab test that is abnormal or we need to change your treatment, we will call you to review the results.   Testing/Procedures: Your physician has requested that you have an echocardiogram. Echocardiography is a painless test that uses sound waves to create images of your heart. It provides your doctor with information about the size and shape of your heart and how well your heart's chambers and valves are working. This procedure takes approximately one hour. There are no restrictions for this procedure.    Follow-Up: At [facility], you and your health needs are our priority.  As part of our continuing mission to provide you with exceptional heart care, we have created designated Provider Care Teams.  These Care Teams include your primary Cardiologist (physician) and Advanced Practice Providers (APPs -  Physician Assistants and Nurse Practitioners) who all work together to provide you with the care you need, when you need it.  We recommend signing up for the patient portal called "MyChart".  Sign up information is provided on this After Visit Summary.  MyChart is used to connect with patients for Virtual Visits (Telemedicine).  Patients are able to view lab/test results, encounter notes, upcoming appointments, etc.  Non-urgent messages can be sent to your provider as well.   To learn more about what you can do with MyChart, go to [email or web address].    Your next appointment:   After the echo  The format for your next appointment:   In Person  Provider:   Kate Sable, MD   Other  Instructions N/A   Signed, Kate Sable, MD  05/17/2021 12:48 PM    [place]

## 2021-06-21 ENCOUNTER — Telehealth: Payer: Self-pay | Admitting: Family Medicine

## 2021-06-21 NOTE — Telephone Encounter (Signed)
Pt is calling to reschedule 07/02/21 AWV to a Wednesday please advise CB- [phone]

## 2021-06-26 ENCOUNTER — Ambulatory Visit (INDEPENDENT_AMBULATORY_CARE_PROVIDER_SITE_OTHER): Payer: Medicare Other

## 2021-06-26 ENCOUNTER — Other Ambulatory Visit: Payer: Self-pay

## 2021-06-26 DIAGNOSIS — R011 Cardiac murmur, unspecified: Secondary | ICD-10-CM

## 2021-06-26 DIAGNOSIS — I351 Nonrheumatic aortic (valve) insufficiency: Secondary | ICD-10-CM | POA: Diagnosis not present

## 2021-06-26 LAB — ECHOCARDIOGRAM COMPLETE
AR max vel: 2.37 cm2
AV Area VTI: 2.68 cm2
AV Area mean vel: 2.62 cm2
AV Mean grad: 3 mmHg
AV Peak grad: 6.9 mmHg
Ao pk vel: 1.31 m/s
Area-P 1/2: 2.69 cm2
Calc EF: 54.8 %
S' Lateral: 2.7 cm
Single Plane A2C EF: 56.1 %
Single Plane A4C EF: 56 %

## 2021-07-01 ENCOUNTER — Other Ambulatory Visit: Payer: Self-pay

## 2021-07-01 ENCOUNTER — Encounter: Payer: Self-pay | Admitting: Cardiology

## 2021-07-01 ENCOUNTER — Ambulatory Visit (INDEPENDENT_AMBULATORY_CARE_PROVIDER_SITE_OTHER): Payer: Medicare Other | Admitting: Cardiology

## 2021-07-01 VITALS — BP 116/60 | HR 67 | Ht 65.0 in | Wt 114.4 lb

## 2021-07-01 DIAGNOSIS — I8312 Varicose veins of left lower extremity with inflammation: Secondary | ICD-10-CM

## 2021-07-01 DIAGNOSIS — R011 Cardiac murmur, unspecified: Secondary | ICD-10-CM | POA: Diagnosis not present

## 2021-07-01 DIAGNOSIS — I8311 Varicose veins of right lower extremity with inflammation: Secondary | ICD-10-CM | POA: Diagnosis not present

## 2021-07-01 NOTE — Patient Instructions (Signed)

## 2021-07-01 NOTE — Progress Notes (Signed)
Cardiology Office Note:    Date:  07/01/2021   ID:  Shannon Mckinney, DOB September 26, 1928, MRN [id number]  PCP:  Virginia Crews, MD   [facility] Providers Cardiologist:  Kate Sable, MD     Referring MD: Virginia Crews, MD   No chief complaint on file.    History of Present Illness:    Shannon Mckinney is a 87 y.o. female with a hx of hyperlipidemia, thyroid dysfunction, who presents for follow-up.  She was previously seen due a cardiac murmur  Echocardiogram was ordered to evaluate for any significant arrhythmias.  Presents for echocardiogram results.  Denies any syncope or presyncope.  Denies chest pain or shortness of breath.  She has occasional dependent edema and varicose veins which is chronic.  No edema today.  Also has some stinging in the legs occasionally.  Presents today with caregiver, plans to follow-up with PCP regarding possible neuropathy.  Prior notes echocardiogram in 2012 showed normal systolic function, EF 65%, mild aortic valve regurgitation.   Past Medical History:  Diagnosis Date   Allergy    Arthritis    Benign head tremor    Diabetes mellitus (HCC)    diet controlled   Fibrocystic breast disease    Heart murmur    History of abnormal Pap smear    Hx of basal cell carcinoma 11/06/2006   R nasolabial, excised 11/24/2006   Hx of basal cell carcinoma 04/10/2009   Mid nasal dorsum   Hx of basal cell carcinoma 01/26/2017   Top of L shoulder   Hypercholesterolemia    Hyperthyroidism    s/p ablation   Squamous cell carcinoma of skin 01/21/2016   R temple above lat brow   Squamous cell carcinoma of skin 06/23/2016   L upper lip, excised 08/04/2016    Past Surgical History:  Procedure Laterality Date   BREAST BIOPSY  70's   benign   CARPAL TUNNEL RELEASE     right hand   CATARACT EXTRACTION W/PHACO Right 05/09/2020   Procedure: CATARACT EXTRACTION PHACO AND INTRAOCULAR LENS PLACEMENT ([facility]) RIGHT DIABETIC 10.17  01:29.9  11.3%;  Surgeon:  Leandrew Koyanagi, MD;  Location: [place];  Service: Ophthalmology;  Laterality: Right;   CATARACT EXTRACTION W/PHACO Left 05/30/2020   Procedure: CATARACT EXTRACTION PHACO AND INTRAOCULAR LENS PLACEMENT ([place]) LEFT DIABETIC;  Surgeon: Leandrew Koyanagi, MD;  Location: [place];  Service: Ophthalmology;  Laterality: Left;  12.20 1:24.7 14.4%   KNEE ARTHROSCOPY Left 09/12/2015   Procedure: ARTHROSCOPY LEFT KNEE, PARTIAL MEDIAL MENISECTOMY, PARTIAL LATERAL MENISECTOMY, CHONDROPLASTY MEDIAL AND LATERAL;  Surgeon: Dereck Leep, MD;  Location: [facility];  Service: Orthopedics;  Laterality: Left;   SHOULDER ARTHROSCOPY     TONSILLECTOMY      Current Medications: Current Meds  Medication Sig   levothyroxine (SYNTHROID) 75 MCG tablet Take 1 tablet (75 mcg total) by mouth every other day.   levothyroxine (SYNTHROID) 88 MCG tablet Take 1 tablet (88 mcg total) by mouth every other day.     Allergies:   Mysoline [primidone] and Penicillins   Social History   Socioeconomic History   Marital status: Widowed    Spouse name: Not on file   Number of children: 2   Years of education: Not on file   Highest education level: Bachelor's degree (e.g., BA, AB, BS)  Occupational History   Occupation: retired Arboriculturist   Tobacco Use   Smoking status: Never   Smokeless tobacco: Never  Vaping Use  Vaping Use: Never used  Substance and Sexual Activity   Alcohol use: Yes    Alcohol/week: 0.0 standard drinks    Comment: rare glass of wine   Drug use: No   Sexual activity: Not Currently  Other Topics Concern   Not on file  Social History Narrative   Not on file   Social Determinants of Health   Financial Resource Strain: Not on file  Food Insecurity: Not on file  Transportation Needs: Not on file  Physical Activity: Not on file  Stress: Not on file  Social Connections: Not on file     Family History: The patient's family history includes Diabetes in her  brother, brother, father, and sister; Heart disease in her brother, father, and mother. There is no history of Breast cancer or Colon cancer.  ROS:   Please see the history of present illness.     All other systems reviewed and are negative.  EKGs/Labs/Other Studies Reviewed:    The following studies were reviewed today:   EKG:  EKG is  ordered today.  The ekg ordered today demonstrates sinus rhythm, first-degree AV block, PAC  Recent Labs: 10/19/2020: ALT 23; BUN 14; Creatinine, Ser 0.83; Hemoglobin 13.3; Platelets 297; Potassium 4.4; Sodium 142 10/29/2020: TSH 1.060  Recent Lipid Panel    Component Value Date/Time   CHOL 234 (H) 06/28/2020 1514   TRIG 242 (H) 06/28/2020 1514   HDL 65 06/28/2020 1514   CHOLHDL 3.6 06/28/2020 1514   CHOLHDL 5 06/24/2017 0835   VLDL 34.6 06/24/2017 0835   LDLCALC 127 (H) 06/28/2020 1514   LDLDIRECT 130.6 06/22/2013 0934     Risk Assessment/Calculations:          Physical Exam:    VS:  BP 116/60   Pulse 67   Ht 5\' 5"  (1.651 m)   Wt 114 lb 6.4 oz (51.9 kg)   SpO2 95%   BMI 19.04 kg/m     Wt Readings from Last 3 Encounters:  07/01/21 114 lb 6.4 oz (51.9 kg)  05/17/21 116 lb (52.6 kg)  04/16/21 117 lb (53.1 kg)     GEN:  Well nourished, well developed in no acute distress HEENT: Normal NECK: No JVD; No carotid bruits LYMPHATICS: No lymphadenopathy CARDIAC: RRR, 2/6 systolic murmur RESPIRATORY:  Clear to auscultation without rales, wheezing or rhonchi  ABDOMEN: Soft, non-tender, non-distended MUSCULOSKELETAL:  No edema; varicose veins noted, reddish/purplish discoloration noted around ankles. SKIN: Warm and dry NEUROLOGIC:  Alert and oriented x 3 PSYCHIATRIC:  Normal affect   ASSESSMENT:    1. Systolic murmur   2. Varicose veins of both lower extremities with inflammation     PLAN:    In order of problems listed above:  Systolic murmur, echo with normal EF 60 to 65%, mild aortic valve calcifications with no significant  obstruction, which is likely cause for murmur.  Denies shortness of breath.  Lower extremity varicose veins, likely has venous insufficiency.  Leg raising, compression stockings advised.  Advised to use moisturizers for dry skin.  Follow-up as needed     Medication Adjustments/Labs and Tests Ordered: Current medicines are reviewed at length with the patient today.  Concerns regarding medicines are outlined above.  Orders Placed This Encounter  Procedures   EKG 12-Lead     No orders of the defined types were placed in this encounter.   Patient Instructions  Medication Instructions:  Your physician recommends that you continue on your current medications as directed. Please refer to the  Current Medication list given to you today.  *If you need a refill on your cardiac medications before your next appointment, please call your pharmacy*   Lab Work: None ordered If you have labs (blood work) drawn today and your tests are completely normal, you will receive your results only by: [place] (if you have MyChart) OR A paper copy in the mail If you have any lab test that is abnormal or we need to change your treatment, we will call you to review the results.   Testing/Procedures: None ordered   Follow-Up: At [facility], you and your health needs are our priority.  As part of our continuing mission to provide you with exceptional heart care, we have created designated Provider Care Teams.  These Care Teams include your primary Cardiologist (physician) and Advanced Practice Providers (APPs -  Physician Assistants and Nurse Practitioners) who all work together to provide you with the care you need, when you need it.  We recommend signing up for the patient portal called "MyChart".  Sign up information is provided on this After Visit Summary.  MyChart is used to connect with patients for Virtual Visits (Telemedicine).  Patients are able to view lab/test results, encounter  notes, upcoming appointments, etc.  Non-urgent messages can be sent to your provider as well.   To learn more about what you can do with MyChart, go to [email or web address].    Your next appointment:   Follow up as needed   The format for your next appointment:   In Person  Provider:   You may see Kate Sable, MD or one of the following Advanced Practice Providers on your designated Care Team:   Murray Hodgkins, NP Christell Faith, PA-C Cadence Kathlen Mody, [place]    Other Instructions    Signed, Kate Sable, MD  07/01/2021 4:12 PM    [place]

## 2021-07-02 ENCOUNTER — Encounter: Payer: Self-pay | Admitting: Family Medicine

## 2021-07-09 DIAGNOSIS — Z20822 Contact with and (suspected) exposure to covid-19: Secondary | ICD-10-CM | POA: Diagnosis not present

## 2021-07-17 ENCOUNTER — Other Ambulatory Visit: Payer: Self-pay

## 2021-07-17 ENCOUNTER — Ambulatory Visit (INDEPENDENT_AMBULATORY_CARE_PROVIDER_SITE_OTHER): Payer: Medicare Other

## 2021-07-17 VITALS — BP 126/60 | HR 70 | Temp 98.0°F | Ht 65.0 in | Wt 114.4 lb

## 2021-07-17 DIAGNOSIS — Z0000 Encounter for general adult medical examination without abnormal findings: Secondary | ICD-10-CM | POA: Diagnosis not present

## 2021-07-17 NOTE — Patient Instructions (Signed)
Shannon Mckinney , Thank you for taking time to come for your Medicare Wellness Visit. I appreciate your ongoing commitment to your health goals. Please review the following plan we discussed and let me know if I can assist you in the future.   Screening recommendations/referrals: Colonoscopy: aged out Mammogram: 08/10/13 Bone Density: 08/19/18 Recommended yearly ophthalmology/optometry visit for glaucoma screening and checkup Recommended yearly dental visit for hygiene and checkup  Vaccinations: Influenza vaccine: 05/25/19, declined new shot until after Christmas Pneumococcal vaccine: 09/12/16 Tdap vaccine: declined Shingles vaccine: n/d   Covid-19:08/08/19, 08/29/19, 06/08/20  Advanced directives: no  Conditions/risks identified: none  Next appointment: Follow up in one year for your annual wellness visit 07/22/22 @ 10:20am   Preventive Care 65 Years and Older, Female Preventive care refers to lifestyle choices and visits with your health care provider that can promote health and wellness. What does preventive care include? A yearly physical exam. This is also called an annual well check. Dental exams once or twice a year. Routine eye exams. Ask your health care provider how often you should have your eyes checked. Personal lifestyle choices, including: Daily care of your teeth and gums. Regular physical activity. Eating a healthy diet. Avoiding tobacco and drug use. Limiting alcohol use. Practicing safe sex. Taking low-dose aspirin every day. Taking vitamin and mineral supplements as recommended by your health care provider. What happens during an annual well check? The services and screenings done by your health care provider during your annual well check will depend on your age, overall health, lifestyle risk factors, and family history of disease. Counseling  Your health care provider may ask you questions about your: Alcohol use. Tobacco use. Drug use. Emotional  well-being. Home and relationship well-being. Sexual activity. Eating habits. History of falls. Memory and ability to understand (cognition). Work and work Statistician. Reproductive health. Screening  You may have the following tests or measurements: Height, weight, and BMI. Blood pressure. Lipid and cholesterol levels. These may be checked every 5 years, or more frequently if you are over 60 years old. Skin check. Lung cancer screening. You may have this screening every year starting at age 68 if you have a 30-pack-year history of smoking and currently smoke or have quit within the past 15 years. Fecal occult blood test (FOBT) of the stool. You may have this test every year starting at age 29. Flexible sigmoidoscopy or colonoscopy. You may have a sigmoidoscopy every 5 years or a colonoscopy every 10 years starting at age 38. Hepatitis C blood test. Hepatitis B blood test. Sexually transmitted disease (STD) testing. Diabetes screening. This is done by checking your blood sugar (glucose) after you have not eaten for a while (fasting). You may have this done every 1-3 years. Bone density scan. This is done to screen for osteoporosis. You may have this done starting at age 22. Mammogram. This may be done every 1-2 years. Talk to your health care provider about how often you should have regular mammograms. Talk with your health care provider about your test results, treatment options, and if necessary, the need for more tests. Vaccines  Your health care provider may recommend certain vaccines, such as: Influenza vaccine. This is recommended every year. Tetanus, diphtheria, and acellular pertussis (Tdap, Td) vaccine. You may need a Td booster every 10 years. Zoster vaccine. You may need this after age 57. Pneumococcal 13-valent conjugate (PCV13) vaccine. One dose is recommended after age 36. Pneumococcal polysaccharide (PPSV23) vaccine. One dose is recommended after age 58. Talk  to your  health care provider about which screenings and vaccines you need and how often you need them. This information is not intended to replace advice given to you by your health care provider. Make sure you discuss any questions you have with your health care provider. Document Released: 08/10/2015 Document Revised: 04/02/2016 Document Reviewed: 05/15/2015 Elsevier Interactive Patient Education  2017 [place] Prevention in the Home Falls can cause injuries. They can happen to people of all ages. There are many things you can do to make your home safe and to help prevent falls. What can I do on the outside of my home? Regularly fix the edges of walkways and driveways and fix any cracks. Remove anything that might make you trip as you walk through a door, such as a raised step or threshold. Trim any bushes or trees on the path to your home. Use bright outdoor lighting. Clear any walking paths of anything that might make someone trip, such as rocks or tools. Regularly check to see if handrails are loose or broken. Make sure that both sides of any steps have handrails. Any raised decks and porches should have guardrails on the edges. Have any leaves, snow, or ice cleared regularly. Use sand or salt on walking paths during winter. Clean up any spills in your garage right away. This includes oil or grease spills. What can I do in the bathroom? Use night lights. Install grab bars by the toilet and in the tub and shower. Do not use towel bars as grab bars. Use non-skid mats or decals in the tub or shower. If you need to sit down in the shower, use a plastic, non-slip stool. Keep the floor dry. Clean up any water that spills on the floor as soon as it happens. Remove soap buildup in the tub or shower regularly. Attach bath mats securely with double-sided non-slip rug tape. Do not have throw rugs and other things on the floor that can make you trip. What can I do in the bedroom? Use night  lights. Make sure that you have a light by your bed that is easy to reach. Do not use any sheets or blankets that are too big for your bed. They should not hang down onto the floor. Have a firm chair that has side arms. You can use this for support while you get dressed. Do not have throw rugs and other things on the floor that can make you trip. What can I do in the kitchen? Clean up any spills right away. Avoid walking on wet floors. Keep items that you use a lot in easy-to-reach places. If you need to reach something above you, use a strong step stool that has a grab bar. Keep electrical cords out of the way. Do not use floor polish or wax that makes floors slippery. If you must use wax, use non-skid floor wax. Do not have throw rugs and other things on the floor that can make you trip. What can I do with my stairs? Do not leave any items on the stairs. Make sure that there are handrails on both sides of the stairs and use them. Fix handrails that are broken or loose. Make sure that handrails are as long as the stairways. Check any carpeting to make sure that it is firmly attached to the stairs. Fix any carpet that is loose or worn. Avoid having throw rugs at the top or bottom of the stairs. If you do have throw rugs,  attach them to the floor with carpet tape. Make sure that you have a light switch at the top of the stairs and the bottom of the stairs. If you do not have them, ask someone to add them for you. What else can I do to help prevent falls? Wear shoes that: Do not have high heels. Have rubber bottoms. Are comfortable and fit you well. Are closed at the toe. Do not wear sandals. If you use a stepladder: Make sure that it is fully opened. Do not climb a closed stepladder. Make sure that both sides of the stepladder are locked into place. Ask someone to hold it for you, if possible. Clearly mark and make sure that you can see: Any grab bars or handrails. First and last  steps. Where the edge of each step is. Use tools that help you move around (mobility aids) if they are needed. These include: Canes. Walkers. Scooters. Crutches. Turn on the lights when you go into a dark area. Replace any light bulbs as soon as they burn out. Set up your furniture so you have a clear path. Avoid moving your furniture around. If any of your floors are uneven, fix them. If there are any pets around you, be aware of where they are. Review your medicines with your doctor. Some medicines can make you feel dizzy. This can increase your chance of falling. Ask your doctor what other things that you can do to help prevent falls. This information is not intended to replace advice given to you by your health care provider. Make sure you discuss any questions you have with your health care provider. Document Released: 05/10/2009 Document Revised: 12/20/2015 Document Reviewed: 08/18/2014 Elsevier Interactive Patient Education  2017 [employer].

## 2021-07-17 NOTE — Progress Notes (Signed)
Subjective:   Shannon Mckinney is a 87 y.o. female who presents for Medicare Annual (Subsequent) preventive examination.  Review of Systems     Cardiac Risk Factors include: advanced age (>30men, >69 women);diabetes mellitus     Objective:    Today's Vitals   07/17/21 1103  BP: 126/60  Pulse: 70  Temp: 98 F (36.7 C)  TempSrc: Oral  SpO2: 99%  Weight: 114 lb 6.4 oz (51.9 kg)  Height: 5\' 5"  (1.651 m)   Body mass index is 19.04 kg/m.  Advanced Directives 07/17/2021 06/28/2020 05/30/2020 05/09/2020 06/27/2019 06/21/2018 06/11/2017  Does Patient Have a Medical Advance Directive? No Yes Yes Yes Yes Yes Yes  Type of Advance Directive - Living will [place];Living will [place];Living will [place];Living will Living will;Healthcare Power of Attorney Living will  Does patient want to make changes to medical advance directive? - - No - Patient declined No - Patient declined - - No - Patient declined  Copy of [place] in Chart? - - No - copy requested No - copy requested No - copy requested No - copy requested -  Would patient like information on creating a medical advance directive? No - Patient declined - - - - - -    Current Medications (verified) Outpatient Encounter Medications as of 07/17/2021  Medication Sig   levothyroxine (SYNTHROID) 75 MCG tablet Take 1 tablet (75 mcg total) by mouth every other day.   levothyroxine (SYNTHROID) 88 MCG tablet Take 1 tablet (88 mcg total) by mouth every other day.   No facility-administered encounter medications on file as of 07/17/2021.    Allergies (verified) Mysoline [primidone] and Penicillins   History: Past Medical History:  Diagnosis Date   Allergy    Arthritis    Benign head tremor    Diabetes mellitus (HCC)    diet controlled   Fibrocystic breast disease    Heart murmur    History of abnormal Pap smear    Hx of basal cell carcinoma 11/06/2006    R nasolabial, excised 11/24/2006   Hx of basal cell carcinoma 04/10/2009   Mid nasal dorsum   Hx of basal cell carcinoma 01/26/2017   Top of L shoulder   Hypercholesterolemia    Hyperthyroidism    s/p ablation   Squamous cell carcinoma of skin 01/21/2016   R temple above lat brow   Squamous cell carcinoma of skin 06/23/2016   L upper lip, excised 08/04/2016   Past Surgical History:  Procedure Laterality Date   BREAST BIOPSY  70's   benign   CARPAL TUNNEL RELEASE     right hand   CATARACT EXTRACTION W/PHACO Right 05/09/2020   Procedure: CATARACT EXTRACTION PHACO AND INTRAOCULAR LENS PLACEMENT (IOC) RIGHT DIABETIC 10.17  01:29.9  11.3%;  Surgeon: Leandrew Koyanagi, MD;  Location: [facility];  Service: Ophthalmology;  Laterality: Right;   CATARACT EXTRACTION W/PHACO Left 05/30/2020   Procedure: CATARACT EXTRACTION PHACO AND INTRAOCULAR LENS PLACEMENT ([place]) LEFT DIABETIC;  Surgeon: Leandrew Koyanagi, MD;  Location: [facility];  Service: Ophthalmology;  Laterality: Left;  12.20 1:24.7 14.4%   KNEE ARTHROSCOPY Left 09/12/2015   Procedure: ARTHROSCOPY LEFT KNEE, PARTIAL MEDIAL MENISECTOMY, PARTIAL LATERAL MENISECTOMY, CHONDROPLASTY MEDIAL AND LATERAL;  Surgeon: Dereck Leep, MD;  Location: [facility];  Service: Orthopedics;  Laterality: Left;   SHOULDER ARTHROSCOPY     TONSILLECTOMY     Family History  Problem Relation Age of Onset  Heart disease Father        myocardial infarction - died 70   Diabetes Father    Heart disease Mother        myocardial infarction   Heart disease Brother        x2   Diabetes Brother    Diabetes Brother    Diabetes Sister    Breast cancer Neg Hx    Colon cancer Neg Hx    Social History   Socioeconomic History   Marital status: Widowed    Spouse name: Not on file   Number of children: 2   Years of education: Not on file   Highest education level: Bachelor's degree (e.g., BA, AB, BS)  Occupational History   Occupation:  retired Arboriculturist   Tobacco Use   Smoking status: Never   Smokeless tobacco: Never  Vaping Use   Vaping Use: Never used  Substance and Sexual Activity   Alcohol use: Yes    Alcohol/week: 0.0 standard drinks    Comment: rare glass of wine   Drug use: No   Sexual activity: Not Currently  Other Topics Concern   Not on file  Social History Narrative   Not on file   Social Determinants of Health   Financial Resource Strain: Low Risk    Difficulty of Paying Living Expenses: Not hard at all  Food Insecurity: No Food Insecurity   Worried About Charity fundraiser in the Last Year: Never true   [place] in the Last Year: Never true  Transportation Needs: No Transportation Needs   Lack of Transportation (Medical): No   Lack of Transportation (Non-Medical): No  Physical Activity: Insufficiently Active   Days of Exercise per Week: 3 days   Minutes of Exercise per Session: 10 min  Stress: No Stress Concern Present   Feeling of Stress : Not at all  Social Connections: Socially Isolated   Frequency of Communication with Friends and Family: More than three times a week   Frequency of Social Gatherings with Friends and Family: More than three times a week   Attends Religious Services: Never   Marine scientist or Organizations: No   Attends Archivist Meetings: Never   Marital Status: Widowed    Tobacco Counseling Counseling given: Not Answered   Clinical Intake:  Pre-visit preparation completed: Yes  Pain : No/denies pain     Nutritional Status: BMI of 19-24  Normal Nutritional Risks: None Diabetes: Yes CBG done?: No Did pt. bring in CBG monitor from home?: No  How often do you need to have someone help you when you read instructions, pamphlets, or other written materials from your doctor or pharmacy?: 1 - Never  Diabetic?yes Nutrition Risk Assessment:  Has the patient had any N/V/D within the last 2 months?  No  Does the patient have any  non-healing wounds?  No  Has the patient had any unintentional weight loss or weight gain?  No   Diabetes:  Is the patient diabetic?  Yes  If diabetic, was a CBG obtained today?  No  Did the patient bring in their glucometer from home?  No  How often do you monitor your CBG's? never.   Financial Strains and Diabetes Management:  Are you having any financial strains with the device, your supplies or your medication? No .  Does the patient want to be seen by Chronic Care Management for management of their diabetes?  No  Would the patient like  to be referred to a Nutritionist or for Diabetic Management?  No   Diabetic Exams:  Diabetic Eye Exam: Completed 01/22/21 (negative retinopathy).   Diabetic Foot Exam: Completed 12/08/19. Pt has been advised about the importance in completing this examInterpreter Needed?: No  Information entered by :: Kirke Shaggy, LPN   Activities of Daily Living In your present state of health, do you have any difficulty performing the following activities: 07/17/2021 11/30/2020  Hearing? Tempie Donning  Vision? N N  Difficulty concentrating or making decisions? N Y  Walking or climbing stairs? N N  Dressing or bathing? N N  Doing errands, shopping? N N  Preparing Food and eating ? N -  Using the Toilet? N -  In the past six months, have you accidently leaked urine? N -  Do you have problems with loss of bowel control? N -  Managing your Medications? N -  Managing your Finances? N -  Housekeeping or managing your Housekeeping? N -  Some recent data might be hidden    Patient Care Team: Virginia Crews, MD as PCP - General (Family Medicine) Kate Sable, MD as PCP - Cardiology (Cardiology) Leandrew Koyanagi, MD as Referring Physician (Ophthalmology) Brendolyn Patty, MD (Dermatology) Concepcion Elk., MD (Dentistry)  Indicate any recent Medical Services you may have received from other than [facility] providers in the past year (date may be  approximate).     Assessment:   This is a routine wellness examination for Reyanne.  Hearing/Vision screen Hearing Screening - Comments:: Has hearing aids Vision Screening - Comments:: Wears [place]  Dietary issues and exercise activities discussed: Current Exercise Habits: The patient does not participate in regular exercise at present   Goals Addressed             This Visit's Progress    DIET - EAT MORE FRUITS AND VEGETABLES        Depression Screen PHQ 2/9 Scores 07/17/2021 03/19/2021 11/30/2020 06/28/2020 06/27/2019 06/21/2018 03/30/2018  PHQ - 2 Score 2 2 0 1 1 1  0  PHQ- 9 Score - 2 1 - - - 0    Fall Risk Fall Risk  07/17/2021 11/30/2020 06/28/2020 06/27/2019 01/25/2019  Falls in the past year? 0 0 0 0 0  Number falls in past yr: 0 0 0 0 -  Injury with Fall? 0 0 0 0 -  Risk for fall due to : No Fall Risks No Fall Risks - - -  Follow up Falls evaluation completed Falls evaluation completed - - -    FALL RISK PREVENTION PERTAINING TO THE HOME:  Any stairs in or around the home? No  If so, are there any without handrails? No  Home free of loose throw rugs in walkways, pet beds, electrical cords, etc? Yes  Adequate lighting in your home to reduce risk of falls? Yes   ASSISTIVE DEVICES UTILIZED TO PREVENT FALLS:  Life alert? Yes  Use of a cane, walker or w/c? No  Grab bars in the bathroom? No  Shower chair or bench in shower? Yes  Elevated toilet seat or a handicapped toilet? Yes   TIMED UP AND GO:  Was the test performed? Yes .  Length of time to ambulate 10 feet: 6 sec.   Gait slow and steady without use of assistive device  Cognitive Function: MMSE - Mini Mental State Exam 06/11/2017 06/06/2016  Orientation to time 5 5  Orientation to Place 5 5  Registration 3 3  Attention/ Calculation  5 5  Recall 1 3  Language- name 2 objects 2 2  Language- repeat 1 1  Language- follow 3 step command 3 3  Language- read & follow direction 1 1  Write a  sentence 1 1  Copy design 1 1  Total score 28 30     6CIT Screen 06/28/2020 06/27/2019 06/21/2018 06/06/2016  What Year? 0 points 0 points 0 points 0 points  What month? 0 points 0 points 0 points 0 points  What time? 0 points 0 points 0 points 0 points  Count back from 20 0 points 0 points 0 points 0 points  Months in reverse 0 points 0 points 0 points 0 points  Repeat phrase 4 points 0 points 0 points 0 points  Total Score 4 0 0 0    Immunizations Immunization History  Administered Date(s) Administered   Fluad Quad(high Dose 65+) 05/24/2020   Influenza Split 04/27/2012, 05/16/2013, 05/11/2014   Influenza, High Dose Seasonal PF 03/30/2018   Influenza,inj,Quad PF,6+ Mos 06/11/2017   Influenza-Unspecified 04/24/2014, 06/04/2015, 06/24/2016, 05/25/2019   PFIZER(Purple Top)SARS-COV-2 Vaccination 08/08/2019, 08/29/2019, 06/08/2020   Pneumococcal Conjugate-13 07/04/2013   Pneumococcal Polysaccharide-23 09/12/2016    TDAP status: Due, Education has been provided regarding the importance of this vaccine. Advised may receive this vaccine at local pharmacy or Health Dept. Aware to provide a copy of the vaccination record if obtained from local pharmacy or Health Dept. Verbalized acceptance and understanding.  Flu Vaccine status: Declined, Education has been provided regarding the importance of this vaccine but patient still declined. Advised may receive this vaccine at local pharmacy or Health Dept. Aware to provide a copy of the vaccination record if obtained from local pharmacy or Health Dept. Verbalized acceptance and understanding.  Pneumococcal vaccine status: Up to date  Covid-19 vaccine status: Completed vaccines  Qualifies for Shingles Vaccine? No   Zostavax completed No   Shingrix Completed?: No.    Education has been provided regarding the importance of this vaccine. Patient has been advised to call insurance company to determine out of pocket expense if they have not yet  received this vaccine. Advised may also receive vaccine at local pharmacy or Health Dept. Verbalized acceptance and understanding.  Screening Tests Health Maintenance  Topic Date Due   TETANUS/TDAP  Never done   Zoster Vaccines- Shingrix (1 of 2) Never done   COVID-19 Vaccine (4 - Booster for Pfizer series) 08/03/2020   FOOT EXAM  12/07/2020   URINE MICROALBUMIN  12/07/2020   INFLUENZA VACCINE  02/25/2021   HEMOGLOBIN A1C  06/02/2021   OPHTHALMOLOGY EXAM  01/22/2022   DEXA SCAN  08/20/2023   Pneumonia Vaccine 70+ Years old  Completed   HPV VACCINES  Aged Out   MAMMOGRAM  Discontinued    Health Maintenance  Health Maintenance Due  Topic Date Due   TETANUS/TDAP  Never done   Zoster Vaccines- Shingrix (1 of 2) Never done   COVID-19 Vaccine (4 - Booster for Pfizer series) 08/03/2020   FOOT EXAM  12/07/2020   URINE MICROALBUMIN  12/07/2020   INFLUENZA VACCINE  02/25/2021   HEMOGLOBIN A1C  06/02/2021     Mammogram status: No longer required due to aged out.  Bone Density status: Completed 08/19/18. Results reflect: Bone density results: OSTEOPENIA. Repeat every 5 years.  Lung Cancer Screening: (Low Dose CT Chest recommended if Age 74-80 years, 30 pack-year currently smoking OR have quit w/in 15years.) does not qualify.    Additional Screening:  Hepatitis C Screening: does  not qualify; Completed no  Vision Screening: Recommended annual ophthalmology exams for early detection of glaucoma and other disorders of the eye. Is the patient up to date with their annual eye exam?  Yes  Who is the provider or what is the name of the office in which the patient attends annual eye exams? [facility] If pt is not established with a provider, would they like to be referred to a provider to establish care? No .   Dental Screening: Recommended annual dental exams for proper oral hygiene  Community Resource Referral / Chronic Care Management: CRR required this visit?  No   CCM  required this visit?  No      Plan:     I have personally reviewed and noted the following in the patients chart:   Medical and social history Use of alcohol, tobacco or illicit drugs  Current medications and supplements including opioid prescriptions.  Functional ability and status Nutritional status Physical activity Advanced directives List of other physicians Hospitalizations, surgeries, and ER visits in previous 12 months Vitals Screenings to include cognitive, depression, and falls Referrals and appointments  In addition, I have reviewed and discussed with patient certain preventive protocols, quality metrics, and best practice recommendations. A written personalized care plan for preventive services as well as general preventive health recommendations were provided to patient.     Dionisio David, LPN   [id number]   Nurse Notes: none        Subjective:   Shannon Mckinney is a 87 y.o. female who presents for Medicare Annual (Subsequent) preventive examination.  Review of Systems     Cardiac Risk Factors include: advanced age (>58men, >51 women);diabetes mellitus     Objective:    Today's Vitals   07/17/21 1103  BP: 126/60  Pulse: 70  Temp: 98 F (36.7 C)  TempSrc: Oral  SpO2: 99%  Weight: 114 lb 6.4 oz (51.9 kg)  Height: 5\' 5"  (1.651 m)   Body mass index is 19.04 kg/m.  Advanced Directives 07/17/2021 06/28/2020 05/30/2020 05/09/2020 06/27/2019 06/21/2018 06/11/2017  Does Patient Have a Medical Advance Directive? No Yes Yes Yes Yes Yes Yes  Type of Advance Directive - Living will [place];Living will [place];Living will [place];Living will Living will;Healthcare Power of Attorney Living will  Does patient want to make changes to medical advance directive? - - No - Patient declined No - Patient declined - - No - Patient declined  Copy of [place] in Chart? - - No - copy  requested No - copy requested No - copy requested No - copy requested -  Would patient like information on creating a medical advance directive? No - Patient declined - - - - - -    Current Medications (verified) Outpatient Encounter Medications as of 07/17/2021  Medication Sig   levothyroxine (SYNTHROID) 75 MCG tablet Take 1 tablet (75 mcg total) by mouth every other day.   levothyroxine (SYNTHROID) 88 MCG tablet Take 1 tablet (88 mcg total) by mouth every other day.   No facility-administered encounter medications on file as of 07/17/2021.    Allergies (verified) Mysoline [primidone] and Penicillins   History: Past Medical History:  Diagnosis Date   Allergy    Arthritis    Benign head tremor    Diabetes mellitus (HCC)    diet controlled   Fibrocystic breast disease    Heart murmur    History of abnormal Pap smear  Hx of basal cell carcinoma 11/06/2006   R nasolabial, excised 11/24/2006   Hx of basal cell carcinoma 04/10/2009   Mid nasal dorsum   Hx of basal cell carcinoma 01/26/2017   Top of L shoulder   Hypercholesterolemia    Hyperthyroidism    s/p ablation   Squamous cell carcinoma of skin 01/21/2016   R temple above lat brow   Squamous cell carcinoma of skin 06/23/2016   L upper lip, excised 08/04/2016   Past Surgical History:  Procedure Laterality Date   BREAST BIOPSY  70's   benign   CARPAL TUNNEL RELEASE     right hand   CATARACT EXTRACTION W/PHACO Right 05/09/2020   Procedure: CATARACT EXTRACTION PHACO AND INTRAOCULAR LENS PLACEMENT ([facility]) RIGHT DIABETIC 10.17  01:29.9  11.3%;  Surgeon: Leandrew Koyanagi, MD;  Location: [facility];  Service: Ophthalmology;  Laterality: Right;   CATARACT EXTRACTION W/PHACO Left 05/30/2020   Procedure: CATARACT EXTRACTION PHACO AND INTRAOCULAR LENS PLACEMENT ([place]) LEFT DIABETIC;  Surgeon: Leandrew Koyanagi, MD;  Location: [facility];  Service: Ophthalmology;  Laterality: Left;  12.20 1:24.7 14.4%    KNEE ARTHROSCOPY Left 09/12/2015   Procedure: ARTHROSCOPY LEFT KNEE, PARTIAL MEDIAL MENISECTOMY, PARTIAL LATERAL MENISECTOMY, CHONDROPLASTY MEDIAL AND LATERAL;  Surgeon: Dereck Leep, MD;  Location: [facility];  Service: Orthopedics;  Laterality: Left;   SHOULDER ARTHROSCOPY     TONSILLECTOMY     Family History  Problem Relation Age of Onset   Heart disease Father        myocardial infarction - died 52   Diabetes Father    Heart disease Mother        myocardial infarction   Heart disease Brother        x2   Diabetes Brother    Diabetes Brother    Diabetes Sister    Breast cancer Neg Hx    Colon cancer Neg Hx    Social History   Socioeconomic History   Marital status: Widowed    Spouse name: Not on file   Number of children: 2   Years of education: Not on file   Highest education level: Bachelor's degree (e.g., BA, AB, BS)  Occupational History   Occupation: retired Arboriculturist   Tobacco Use   Smoking status: Never   Smokeless tobacco: Never  Vaping Use   Vaping Use: Never used  Substance and Sexual Activity   Alcohol use: Yes    Alcohol/week: 0.0 standard drinks    Comment: rare glass of wine   Drug use: No   Sexual activity: Not Currently  Other Topics Concern   Not on file  Social History Narrative   Not on file   Social Determinants of Health   Financial Resource Strain: Low Risk    Difficulty of Paying Living Expenses: Not hard at all  Food Insecurity: No Food Insecurity   Worried About Charity fundraiser in the Last Year: Never true   [place] in the Last Year: Never true  Transportation Needs: No Transportation Needs   Lack of Transportation (Medical): No   Lack of Transportation (Non-Medical): No  Physical Activity: Insufficiently Active   Days of Exercise per Week: 3 days   Minutes of Exercise per Session: 10 min  Stress: No Stress Concern Present   Feeling of Stress : Not at all  Social Connections: Socially Isolated   Frequency of  Communication with Friends and Family: More than three times a week   Frequency  of Social Gatherings with Friends and Family: More than three times a week   Attends Religious Services: Never   Marine scientist or Organizations: No   Attends Archivist Meetings: Never   Marital Status: Widowed    Tobacco Counseling Counseling given: Not Answered   Clinical Intake:  Pre-visit preparation completed: Yes  Pain : No/denies pain     Nutritional Status: BMI of 19-24  Normal Nutritional Risks: None Diabetes: Yes CBG done?: No Did pt. bring in CBG monitor from home?: No  How often do you need to have someone help you when you read instructions, pamphlets, or other written materials from your doctor or pharmacy?: 1 - Never  Diabetic?yes  Interpreter Needed?: No  Information entered by :: Kirke Shaggy, LPN   Activities of Daily Living In your present state of health, do you have any difficulty performing the following activities: 07/17/2021 11/30/2020  Hearing? Tempie Donning  Vision? N N  Difficulty concentrating or making decisions? N Y  Walking or climbing stairs? N N  Dressing or bathing? N N  Doing errands, shopping? N N  Preparing Food and eating ? N -  Using the Toilet? N -  In the past six months, have you accidently leaked urine? N -  Do you have problems with loss of bowel control? N -  Managing your Medications? N -  Managing your Finances? N -  Housekeeping or managing your Housekeeping? N -  Some recent data might be hidden    Patient Care Team: Virginia Crews, MD as PCP - General (Family Medicine) Kate Sable, MD as PCP - Cardiology (Cardiology) Leandrew Koyanagi, MD as Referring Physician (Ophthalmology) Brendolyn Patty, MD (Dermatology) Concepcion Elk., MD (Dentistry)  Indicate any recent Medical Services you may have received from other than [facility] providers in the past year (date may be approximate).     Assessment:   This is  a routine wellness examination for Shannon Mckinney.  Hearing/Vision screen Hearing Screening - Comments:: Has hearing aids Vision Screening - Comments:: Wears [place]  Dietary issues and exercise activities discussed: Current Exercise Habits: The patient does not participate in regular exercise at present   Goals Addressed             This Visit's Progress    DIET - EAT MORE FRUITS AND VEGETABLES        Depression Screen PHQ 2/9 Scores 07/17/2021 03/19/2021 11/30/2020 06/28/2020 06/27/2019 06/21/2018 03/30/2018  PHQ - 2 Score 2 2 0 1 1 1  0  PHQ- 9 Score - 2 1 - - - 0    Fall Risk Fall Risk  07/17/2021 11/30/2020 06/28/2020 06/27/2019 01/25/2019  Falls in the past year? 0 0 0 0 0  Number falls in past yr: 0 0 0 0 -  Injury with Fall? 0 0 0 0 -  Risk for fall due to : No Fall Risks No Fall Risks - - -  Follow up Falls evaluation completed Falls evaluation completed - - -    FALL RISK PREVENTION PERTAINING TO THE HOME:  Any stairs in or around the home? No  If so, are there any without handrails? No  Home free of loose throw rugs in walkways, pet beds, electrical cords, etc? Yes  Adequate lighting in your home to reduce risk of falls? Yes   ASSISTIVE DEVICES UTILIZED TO PREVENT FALLS:  Life alert? Yes  Use of a cane, walker or w/c? No  Grab bars in the bathroom?  Yes  Shower chair or bench in shower? No  Elevated toilet seat or a handicapped toilet? Yes   TIMED UP AND GO:  Was the test performed? Yes .  Length of time to ambulate 10 feet: 5 sec.   Gait slow and steady without use of assistive device  Cognitive Function: MMSE - Mini Mental State Exam 06/11/2017 06/06/2016  Orientation to time 5 5  Orientation to Place 5 5  Registration 3 3  Attention/ Calculation 5 5  Recall 1 3  Language- name 2 objects 2 2  Language- repeat 1 1  Language- follow 3 step command 3 3  Language- read & follow direction 1 1  Write a sentence 1 1  Copy design 1 1  Total score  28 30     6CIT Screen 06/28/2020 06/27/2019 06/21/2018 06/06/2016  What Year? 0 points 0 points 0 points 0 points  What month? 0 points 0 points 0 points 0 points  What time? 0 points 0 points 0 points 0 points  Count back from 20 0 points 0 points 0 points 0 points  Months in reverse 0 points 0 points 0 points 0 points  Repeat phrase 4 points 0 points 0 points 0 points  Total Score 4 0 0 0    Immunizations Immunization History  Administered Date(s) Administered   Fluad Quad(high Dose 65+) 05/24/2020   Influenza Split 04/27/2012, 05/16/2013, 05/11/2014   Influenza, High Dose Seasonal PF 03/30/2018   Influenza,inj,Quad PF,6+ Mos 06/11/2017   Influenza-Unspecified 04/24/2014, 06/04/2015, 06/24/2016, 05/25/2019   PFIZER(Purple Top)SARS-COV-2 Vaccination 08/08/2019, 08/29/2019, 06/08/2020   Pneumococcal Conjugate-13 07/04/2013   Pneumococcal Polysaccharide-23 09/12/2016    TDAP status: Due, Education has been provided regarding the importance of this vaccine. Advised may receive this vaccine at local pharmacy or Health Dept. Aware to provide a copy of the vaccination record if obtained from local pharmacy or Health Dept. Verbalized acceptance and understanding.  Flu Vaccine status: Declined, Education has been provided regarding the importance of this vaccine but patient still declined. Advised may receive this vaccine at local pharmacy or Health Dept. Aware to provide a copy of the vaccination record if obtained from local pharmacy or Health Dept. Verbalized acceptance and understanding.  Pneumococcal vaccine status: Up to date  Covid-19 vaccine status: Completed vaccines  Qualifies for Shingles Vaccine? Yes   Zostavax completed No   Shingrix Completed?: No.    Education has been provided regarding the importance of this vaccine. Patient has been advised to call insurance company to determine out of pocket expense if they have not yet received this vaccine. Advised may also receive  vaccine at local pharmacy or Health Dept. Verbalized acceptance and understanding.  Screening Tests Health Maintenance  Topic Date Due   TETANUS/TDAP  Never done   Zoster Vaccines- Shingrix (1 of 2) Never done   COVID-19 Vaccine (4 - Booster for Pfizer series) 08/03/2020   FOOT EXAM  12/07/2020   URINE MICROALBUMIN  12/07/2020   INFLUENZA VACCINE  02/25/2021   HEMOGLOBIN A1C  06/02/2021   OPHTHALMOLOGY EXAM  01/22/2022   DEXA SCAN  08/20/2023   Pneumonia Vaccine 13+ Years old  Completed   HPV VACCINES  Aged Out   MAMMOGRAM  Discontinued    Health Maintenance  Health Maintenance Due  Topic Date Due   TETANUS/TDAP  Never done   Zoster Vaccines- Shingrix (1 of 2) Never done   COVID-19 Vaccine (4 - Booster for [place] series) 08/03/2020   FOOT EXAM  12/07/2020  URINE MICROALBUMIN  12/07/2020   INFLUENZA VACCINE  02/25/2021   HEMOGLOBIN A1C  06/02/2021    Colorectal cancer screening: No longer required.   Mammogram status: No longer required due to aged out.    Lung Cancer Screening: (Low Dose CT Chest recommended if Age 32-80 years, 30 pack-year currently smoking OR have quit w/in 15years.) does not qualify.    Additional Screening:  Hepatitis C Screening: does not qualify; Completed no  Vision Screening: Recommended annual ophthalmology exams for early detection of glaucoma and other disorders of the eye. Is the patient up to date with their annual eye exam?  Yes  Who is the provider or what is the name of the office in which the patient attends annual eye exams? [facility] If pt is not established with a provider, would they like to be referred to a provider to establish care? No .   Dental Screening: Recommended annual dental exams for proper oral hygiene  Community Resource Referral / Chronic Care Management: CRR required this visit?  No   CCM required this visit?  No      Plan:     I have personally reviewed and noted the following in the  patients chart:   Medical and social history Use of alcohol, tobacco or illicit drugs  Current medications and supplements including opioid prescriptions.  Functional ability and status Nutritional status Physical activity Advanced directives List of other physicians Hospitalizations, surgeries, and ER visits in previous 12 months Vitals Screenings to include cognitive, depression, and falls Referrals and appointments  In addition, I have reviewed and discussed with patient certain preventive protocols, quality metrics, and best practice recommendations. A written personalized care plan for preventive services as well as general preventive health recommendations were provided to patient.     Dionisio David, LPN   [id number]   Nurse Notes:

## 2021-07-30 ENCOUNTER — Ambulatory Visit (INDEPENDENT_AMBULATORY_CARE_PROVIDER_SITE_OTHER): Payer: Medicare Other | Admitting: Family Medicine

## 2021-07-30 ENCOUNTER — Encounter: Payer: Self-pay | Admitting: Family Medicine

## 2021-07-30 ENCOUNTER — Other Ambulatory Visit: Payer: Self-pay

## 2021-07-30 VITALS — BP 138/69 | HR 67 | Resp 16 | Wt 114.6 lb

## 2021-07-30 DIAGNOSIS — I872 Venous insufficiency (chronic) (peripheral): Secondary | ICD-10-CM

## 2021-07-30 NOTE — Progress Notes (Signed)
Established patient visit   Patient: Shannon Mckinney   DOB: 1928-12-28   88 y.o. Female  MRN: [id number] Visit Date: 07/30/2021  Today's healthcare provider: Lavon Paganini, MD   Chief Complaint  Patient presents with   Edema   Subjective    HPI  Venous insufficiency - purple discoloration and tingling around bilateral ankles for 1 month - dry skin and rash on heels - no edema, shortness of breath, orthopnea, cough  Medications: Outpatient Medications Prior to Visit  Medication Sig   levothyroxine (SYNTHROID) 75 MCG tablet Take 1 tablet (75 mcg total) by mouth every other day.   levothyroxine (SYNTHROID) 88 MCG tablet Take 1 tablet (88 mcg total) by mouth every other day.   No facility-administered medications prior to visit.    Review of Systems  Constitutional: Negative.   Respiratory:  Negative for cough and shortness of breath.   Cardiovascular:  Negative for chest pain and leg swelling.  Skin:        Purple discoloration on bilateral ankles  Neurological:  Negative for weakness and numbness.  All other systems reviewed and are negative.     Objective    BP 138/69 (BP Location: Left Arm, Patient Position: Sitting, Cuff Size: Normal)    Pulse 67    Resp 16    Wt 114 lb 9.6 oz (52 kg)    BMI 19.07 kg/m  {Show previous vital signs (optional):23777}  Physical Exam Vitals reviewed.  Constitutional:      General: She is not in acute distress.    Appearance: Normal appearance. She is not ill-appearing or toxic-appearing.  HENT:     Head: Normocephalic and atraumatic.     Right Ear: External ear normal.     Left Ear: External ear normal.     Nose: Nose normal.     Mouth/Throat:     Mouth: Mucous membranes are moist.     Pharynx: Oropharynx is clear. No oropharyngeal exudate or posterior oropharyngeal erythema.  Eyes:     General: No scleral icterus.    Extraocular Movements: Extraocular movements intact.     Conjunctiva/sclera: Conjunctivae normal.      Pupils: Pupils are equal, round, and reactive to light.  Cardiovascular:     Rate and Rhythm: Normal rate and regular rhythm.     Pulses: Normal pulses.     Heart sounds: Normal heart sounds. No murmur heard.   No friction rub. No gallop.  Pulmonary:     Effort: Pulmonary effort is normal. No respiratory distress.     Breath sounds: Normal breath sounds. No wheezing or rhonchi.  Chest:     Chest wall: No tenderness.  Abdominal:     General: Abdomen is flat. There is no distension.     Palpations: Abdomen is soft.     Tenderness: There is no abdominal tenderness.  Musculoskeletal:        General: Normal range of motion.     Cervical back: Normal range of motion and neck supple.     Right lower leg: No edema.     Left lower leg: No edema.  Skin:    General: Skin is warm and dry.     Capillary Refill: Capillary refill takes less than 2 seconds.     Findings: Rash present.     Comments: Venous stasis dermatitis on bilateral ankles  Neurological:     General: No focal deficit present.     Mental Status: She is alert and  oriented to person, place, and time. Mental status is at baseline.  Psychiatric:        Mood and Affect: Mood normal.     No results found for any visits on 07/30/21.  Assessment & Plan     Problem List Items Addressed This Visit       Cardiovascular and Mediastinum   Venous insufficiency of both lower extremities    Bilateral venous stasis dermatitis No edema currently, but does at the end of the day No changes in sensation, no weakness, no pain on palpation on exam Wear compression stockings for venous insufficiency during the day      Other Visit Diagnoses     Venous stasis dermatitis of both lower extremities    -  Primary        Return if symptoms worsen or fail to improve.      Nelva Nay, Medical Student 07/30/2021, 12:00 PM   Patient seen along with MS3 student Nelva Nay. I personally evaluated this patient along with the student, and  verified all aspects of the history, physical exam, and medical decision making as documented by the student. I agree with the student's documentation and have made all necessary edits.  Devoiry Corriher, Dionne Bucy, MD, MPH [facility]

## 2021-07-30 NOTE — Assessment & Plan Note (Addendum)
Bilateral venous stasis dermatitis No edema currently, but does at the end of the day No changes in sensation, no weakness, no pain on palpation on exam Wear compression stockings for venous insufficiency during the day

## 2021-08-12 DIAGNOSIS — Z20822 Contact with and (suspected) exposure to covid-19: Secondary | ICD-10-CM | POA: Diagnosis not present

## 2021-08-15 ENCOUNTER — Ambulatory Visit: Payer: Medicare Other | Admitting: Family Medicine

## 2021-08-27 ENCOUNTER — Other Ambulatory Visit: Payer: Self-pay

## 2021-08-27 ENCOUNTER — Ambulatory Visit (INDEPENDENT_AMBULATORY_CARE_PROVIDER_SITE_OTHER): Payer: Medicare Other | Admitting: Dermatology

## 2021-08-27 DIAGNOSIS — L578 Other skin changes due to chronic exposure to nonionizing radiation: Secondary | ICD-10-CM

## 2021-08-27 DIAGNOSIS — D224 Melanocytic nevi of scalp and neck: Secondary | ICD-10-CM

## 2021-08-27 DIAGNOSIS — D1800 Hemangioma unspecified site: Secondary | ICD-10-CM | POA: Diagnosis not present

## 2021-08-27 DIAGNOSIS — Z85828 Personal history of other malignant neoplasm of skin: Secondary | ICD-10-CM | POA: Diagnosis not present

## 2021-08-27 DIAGNOSIS — D2239 Melanocytic nevi of other parts of face: Secondary | ICD-10-CM | POA: Diagnosis not present

## 2021-08-27 DIAGNOSIS — L821 Other seborrheic keratosis: Secondary | ICD-10-CM

## 2021-08-27 DIAGNOSIS — L820 Inflamed seborrheic keratosis: Secondary | ICD-10-CM

## 2021-08-27 DIAGNOSIS — Z1283 Encounter for screening for malignant neoplasm of skin: Secondary | ICD-10-CM | POA: Diagnosis not present

## 2021-08-27 DIAGNOSIS — D229 Melanocytic nevi, unspecified: Secondary | ICD-10-CM | POA: Diagnosis not present

## 2021-08-27 DIAGNOSIS — L814 Other melanin hyperpigmentation: Secondary | ICD-10-CM

## 2021-08-27 NOTE — Patient Instructions (Addendum)

## 2021-08-27 NOTE — Progress Notes (Signed)
Follow-Up Visit   Subjective  Shannon Mckinney is a 88 y.o. female who presents for the following: Annual Exam.  The patient presents for Total-Body Skin Exam (TBSE) for skin cancer screening and mole check.  The patient has spots, moles and lesions to be evaluated, some may be new or changing. She has a history of BCCs and SCCs.  Patient accompanied by caregiver.    The following portions of the chart were reviewed this encounter and updated as appropriate:       Review of Systems:  No other skin or systemic complaints except as noted in HPI or Assessment and Plan.  Objective  Well appearing patient in no apparent distress; mood and affect are within normal limits.  A full examination was performed including scalp, head, eyes, ears, nose, lips, neck, chest, axillae, abdomen, back, buttocks, bilateral upper extremities, bilateral lower extremities, hands, feet, fingers, toes, fingernails, and toenails. All findings within normal limits unless otherwise noted below.  right nasal tip 1.110mm firm flesh papule  Left Wrist Pink keratotic flat papule  Anterior Neck 4.78mm brown macule, darker center    Assessment & Plan  Skin cancer screening performed today.  Actinic Damage - chronic, secondary to cumulative UV radiation exposure/sun exposure over time - diffuse scaly erythematous macules with underlying dyspigmentation - Recommend daily broad spectrum sunscreen SPF 30+ to sun-exposed areas, reapply every 2 hours as needed.  - Recommend staying in the shade or wearing long sleeves, sun glasses (UVA+UVB protection) and wide brim hats (4-inch brim around the entire circumference of the hat). - Call for new or changing lesions.  Seborrheic Keratoses - Stuck-on, waxy, tan-brown papules and/or plaques  - Benign-appearing - Discussed benign etiology and prognosis. - Observe - Call for any changes  Hemangiomas - Red papules - Discussed benign nature - Observe - Call for any  changes  Lentigines - Scattered tan macules - Due to sun exposure - Benign-appering, observe - Recommend daily broad spectrum sunscreen SPF 30+ to sun-exposed areas, reapply every 2 hours as needed. - Call for any changes  Melanocytic Nevi - Tan-brown and/or pink-flesh-colored symmetric macules and papules - Benign appearing on exam today - Observation - Call clinic for new or changing moles - Recommend daily use of broad spectrum spf 30+ sunscreen to sun-exposed areas.   History of Squamous Cell Carcinoma of the Skin - No evidence of recurrence today  - Recommend regular full body skin exams - Recommend daily broad spectrum sunscreen SPF 30+ to sun-exposed areas, reapply every 2 hours as needed.  - Call if any new or changing lesions are noted between office visits  History of Basal Cell Carcinoma of the Skin - No evidence of recurrence today - Recommend regular full body skin exams - Recommend daily broad spectrum sunscreen SPF 30+ to sun-exposed areas, reapply every 2 hours as needed.  - Call if any new or changing lesions are noted between office visits   Fibrous papule of nose right nasal tip  Benign, observe. Stable.    Inflamed seborrheic keratosis Left Wrist  vs AK  Destruction of lesion - Left Wrist  Destruction method: cryotherapy   Informed consent: discussed and consent obtained   Lesion destroyed using liquid nitrogen: Yes   Region frozen until ice ball extended beyond lesion: Yes   Outcome: patient tolerated procedure well with no complications   Post-procedure details: wound care instructions given   Additional details:  Prior to procedure, discussed risks of blister formation, small wound, skin  dyspigmentation, or rare scar following cryotherapy. Recommend Vaseline ointment to treated areas while healing.   Nevus Anterior Neck  vs SK  Benign-appearing.  Observation.  Call clinic for new or changing moles.  Recommend daily use of broad spectrum  spf 30+ sunscreen to sun-exposed areas.    Return in about 1 year (around 08/27/2022) for TBSE.  IJamesetta Orleans, CMA, am acting as scribe for Brendolyn Patty, MD .  Documentation: I have reviewed the above documentation for accuracy and completeness, and I agree with the above.  Brendolyn Patty MD

## 2021-09-13 DIAGNOSIS — Z20822 Contact with and (suspected) exposure to covid-19: Secondary | ICD-10-CM | POA: Diagnosis not present

## 2021-09-27 ENCOUNTER — Other Ambulatory Visit: Payer: Self-pay | Admitting: Family Medicine

## 2021-09-27 DIAGNOSIS — E890 Postprocedural hypothyroidism: Secondary | ICD-10-CM

## 2021-09-27 NOTE — Telephone Encounter (Signed)
Requested Prescriptions  ?Pending Prescriptions Disp Refills  ?? levothyroxine (SYNTHROID) 75 MCG tablet [Pharmacy Med Name: LEVOTHYROXINE SODIUM 75 MCG TAB] 45 tablet 0  ?  Sig: TAKE 1 TABLET BY MOUTH EVERY OTHER DAY.  ?  ? Endocrinology:  Hypothyroid Agents Passed - 09/27/2021  4:53 PM  ?  ?  Passed - TSH in normal range and within 360 days  ?  TSH  ?Date Value Ref Range Status  ?10/29/2020 1.060 0.450 - 4.500 uIU/mL Final  ?   ?  ?  Passed - Valid encounter within last 12 months  ?  Recent Outpatient Visits   ?      ? 1 month ago Venous stasis dermatitis of both lower extremities  ? [facility] [place], Dionne Bucy, MD  ? 5 months ago Frequency of urination  ? [facility] Jerrol Banana., MD  ? 6 months ago Venous insufficiency of both lower extremities  ? [facility] Gwyneth Sprout, FNP  ? 10 months ago Postablative hypothyroidism  ? [facility] [place], Dionne Bucy, MD  ? 11 months ago Hypothyroidism, unspecified type  ? [place], FNP  ?  ?  ?Future Appointments   ?        ? In 11 months Brendolyn Patty, MD [place]  ?  ? ?  ?  ?  ?? levothyroxine (SYNTHROID) 88 MCG tablet [Pharmacy Med Name: LEVOTHYROXINE SODIUM 88 MCG TAB] 45 tablet 0  ?  Sig: TAKE 1 TABLET BY MOUTH EVERY OTHER DAY.  ?  ? Endocrinology:  Hypothyroid Agents Passed - 09/27/2021  4:53 PM  ?  ?  Passed - TSH in normal range and within 360 days  ?  TSH  ?Date Value Ref Range Status  ?10/29/2020 1.060 0.450 - 4.500 uIU/mL Final  ?   ?  ?  Passed - Valid encounter within last 12 months  ?  Recent Outpatient Visits   ?      ? 1 month ago Venous stasis dermatitis of both lower extremities  ? [facility], Dionne Bucy, MD  ? 5 months ago Frequency of urination  ? [facility] Jerrol Banana., MD  ? 6 months ago Venous insufficiency of both lower extremities  ? [facility] Gwyneth Sprout, FNP  ? 10 months ago Postablative hypothyroidism  ? [facility] [place], Dionne Bucy, MD  ? 11 months ago Hypothyroidism, unspecified type  ? [place], FNP  ?  ?  ?Future Appointments   ?        ? In 11 months Brendolyn Patty, MD [place]  ?  ? ?  ?  ?  ? ? ?

## 2021-10-15 DIAGNOSIS — Z20822 Contact with and (suspected) exposure to covid-19: Secondary | ICD-10-CM | POA: Diagnosis not present

## 2021-11-27 DIAGNOSIS — Z20822 Contact with and (suspected) exposure to covid-19: Secondary | ICD-10-CM | POA: Diagnosis not present

## 2021-11-28 DIAGNOSIS — Z20822 Contact with and (suspected) exposure to covid-19: Secondary | ICD-10-CM | POA: Diagnosis not present

## 2021-12-25 ENCOUNTER — Other Ambulatory Visit: Payer: Self-pay | Admitting: Family Medicine

## 2021-12-25 DIAGNOSIS — E890 Postprocedural hypothyroidism: Secondary | ICD-10-CM

## 2022-02-18 ENCOUNTER — Telehealth: Payer: Self-pay | Admitting: Family Medicine

## 2022-02-18 ENCOUNTER — Telehealth: Payer: Self-pay

## 2022-02-18 NOTE — Telephone Encounter (Signed)
Danne Baxter calling from [employer] is calling to ask if Dr. Sharmaine Base staff can call the patient to let her know that she approved for [employer]. And to see if she is interested. Danne Baxter reports that the patient can not understand him. CB- [phone]

## 2022-02-18 NOTE — Telephone Encounter (Signed)
RNCM received inbound call from Mr. Shannon Mckinney with [facility] requesting this RNCM call patient re: medication prior auth. [place] needs to confirm patient's address and another phone number for patient. This RNCM confirmed same phone number as pharmacy on file. This RNCM advised this is [facility] ED and unable to assist with PA. This RNCM provided patient's PCP phone number for further assistance.

## 2022-02-21 NOTE — Telephone Encounter (Signed)
LMTCB-Ok for [facility] nurse to relay message.

## 2022-02-24 NOTE — Telephone Encounter (Signed)
Danne Baxter called and asked if we could contact patient regarding this product. I placed him on hold and called patient. Patient is not familiar with the product, has not discussed with any provider and does not feel she needs it. Patient advised I would let Danne Baxter know she is not interested at this time.  Danne Baxter notified- patient does not want product at this time- his number is recorded in the chart and if she changes her mind we will call him back.

## 2022-03-26 ENCOUNTER — Other Ambulatory Visit: Payer: Self-pay | Admitting: Family Medicine

## 2022-03-26 DIAGNOSIS — E890 Postprocedural hypothyroidism: Secondary | ICD-10-CM

## 2022-04-21 ENCOUNTER — Other Ambulatory Visit: Payer: Self-pay | Admitting: Family Medicine

## 2022-04-23 ENCOUNTER — Other Ambulatory Visit: Payer: Self-pay | Admitting: Family Medicine

## 2022-04-23 DIAGNOSIS — E890 Postprocedural hypothyroidism: Secondary | ICD-10-CM

## 2022-04-23 NOTE — Telephone Encounter (Signed)
Requested medication (s) are due for refill today:   Not sure  Requested medication (s) are on the active medication list:   Yes  Future visit scheduled:   No   Last ordered: 04/21/2022 #45, 0 refills  (takes every other day)  Returned because TSH overdue per protocol.     Requested Prescriptions  Pending Prescriptions Disp Refills   levothyroxine (SYNTHROID) 88 MCG tablet [Pharmacy Med Name: LEVOTHYROXINE SODIUM 88 MCG TAB] 45 tablet 0    Sig: TAKE 1 TABLET BY MOUTH EVERY OTHER DAY.     Endocrinology:  Hypothyroid Agents Failed - 04/23/2022 12:22 PM      Failed - TSH in normal range and within 360 days    TSH  Date Value Ref Range Status  10/29/2020 1.060 0.450 - 4.500 uIU/mL Final         Passed - Valid encounter within last 12 months    Recent Outpatient Visits           8 months ago Venous stasis dermatitis of both lower extremities   [facility] [place], Dionne Bucy, MD   1 year ago Frequency of urination   [facility] Jerrol Banana., MD   1 year ago Venous insufficiency of both lower extremities   [facility] Gwyneth Sprout, FNP   1 year ago Postablative hypothyroidism   [facility], Dionne Bucy, MD   1 year ago Hypothyroidism, unspecified type   [place], Kel[place]ine, [place]       Future Appointments             In 4 months Brendolyn Patty, MD [place]

## 2022-05-15 DIAGNOSIS — Z23 Encounter for immunization: Secondary | ICD-10-CM | POA: Diagnosis not present

## 2022-06-02 DIAGNOSIS — H35373 Puckering of macula, bilateral: Secondary | ICD-10-CM | POA: Diagnosis not present

## 2022-06-02 LAB — HM DIABETES EYE EXAM

## 2022-07-22 ENCOUNTER — Telehealth: Payer: Self-pay | Admitting: Family Medicine

## 2022-07-22 ENCOUNTER — Other Ambulatory Visit: Payer: Self-pay | Admitting: Family Medicine

## 2022-07-22 DIAGNOSIS — E890 Postprocedural hypothyroidism: Secondary | ICD-10-CM

## 2022-07-22 MED ORDER — LEVOTHYROXINE SODIUM 88 MCG PO TABS: ORAL_TABLET | ORAL | 0 refills | Status: DC

## 2022-07-22 NOTE — Telephone Encounter (Signed)
Medication Refill - Medication: levothyroxine (SYNTHROID) 88 MCG tablet [[id number]]  Per patients daughter in law Natina Wiginton she might have enough pills to last through 07/28/2022.  Please call Ashani Pumphrey (daughter in law) [phone]  Has the patient contacted their pharmacy? Yes.    Per pharmacy patient has no refills left, need to reach out to PCP  Preferred Pharmacy (with phone number or street name):  [place], [place] - [place] Phone: [phone]  Fax: [phone]     Has the patient been seen for an appointment in the last year OR does the patient have an upcoming appointment? Yes.    Agent: Please be advised that RX refills may take up to 3 business days. We ask that you follow-up with your pharmacy.

## 2022-07-25 NOTE — Progress Notes (Unsigned)
Complete physical exam   Patient: Shannon Mckinney   DOB: April 10, 1929   88 y.o. Female  MRN: [id number] Visit Date: 07/31/2022  Today's healthcare provider: Lavon Paganini, MD   No chief complaint on file.  Subjective    Shannon Mckinney is a 88 y.o. female who presents today for a complete physical exam.  She reports consuming a {diet types:17450} diet. {Exercise:19826} She generally feels {well/fairly well/poorly:18703}. She reports sleeping {well/fairly well/poorly:18703}. She {does/does not:200015} have additional problems to discuss today.  HPI  07/30/22 AWV  Past Medical History:  Diagnosis Date   Allergy    Arthritis    Benign head tremor    Diabetes mellitus (HCC)    diet controlled   Fibrocystic breast disease    Heart murmur    History of abnormal Pap smear    Hx of basal cell carcinoma 11/06/2006   R nasolabial, excised 11/24/2006   Hx of basal cell carcinoma 04/10/2009   Mid nasal dorsum   Hx of basal cell carcinoma 01/26/2017   Top of L shoulder   Hypercholesterolemia    Hyperthyroidism    s/p ablation   Squamous cell carcinoma of skin 01/21/2016   R temple above lat brow   Squamous cell carcinoma of skin 06/23/2016   L upper lip, excised 08/04/2016   Past Surgical History:  Procedure Laterality Date   BREAST BIOPSY  70's   benign   CARPAL TUNNEL RELEASE     right hand   CATARACT EXTRACTION W/PHACO Right 05/09/2020   Procedure: CATARACT EXTRACTION PHACO AND INTRAOCULAR LENS PLACEMENT ([facility]) RIGHT DIABETIC 10.17  01:29.9  11.3%;  Surgeon: Leandrew Koyanagi, MD;  Location: [place];  Service: Ophthalmology;  Laterality: Right;   CATARACT EXTRACTION W/PHACO Left 05/30/2020   Procedure: CATARACT EXTRACTION PHACO AND INTRAOCULAR LENS PLACEMENT ([place]) LEFT DIABETIC;  Surgeon: Leandrew Koyanagi, MD;  Location: [place];  Service: Ophthalmology;  Laterality: Left;  12.20 1:24.7 14.4%   KNEE ARTHROSCOPY Left 09/12/2015   Procedure:  ARTHROSCOPY LEFT KNEE, PARTIAL MEDIAL MENISECTOMY, PARTIAL LATERAL MENISECTOMY, CHONDROPLASTY MEDIAL AND LATERAL;  Surgeon: Dereck Leep, MD;  Location: [facility];  Service: Orthopedics;  Laterality: Left;   SHOULDER ARTHROSCOPY     TONSILLECTOMY     Social History   Socioeconomic History   Marital status: Widowed    Spouse name: Not on file   Number of children: 2   Years of education: Not on file   Highest education level: Bachelor's degree (e.g., BA, AB, BS)  Occupational History   Occupation: retired Arboriculturist   Tobacco Use   Smoking status: Never   Smokeless tobacco: Never  Vaping Use   Vaping Use: Never used  Substance and Sexual Activity   Alcohol use: Yes    Alcohol/week: 0.0 standard drinks of alcohol    Comment: rare glass of wine   Drug use: No   Sexual activity: Not Currently  Other Topics Concern   Not on file  Social History Narrative   Not on file   Social Determinants of Health   Financial Resource Strain: Low Risk  (07/17/2021)   Overall Financial Resource Strain (CARDIA)    Difficulty of Paying Living Expenses: Not hard at all  Food Insecurity: No Food Insecurity (07/17/2021)   Hunger Vital Sign    Worried About Running Out of Food in the Last Year: Never true    Ran Out of Food in the Last Year: Never true  Transportation Needs: No  Transportation Needs (07/17/2021)   PRAPARE - Hydrologist (Medical): No    Lack of Transportation (Non-Medical): No  Physical Activity: Insufficiently Active (07/17/2021)   Exercise Vital Sign    Days of Exercise per Week: 3 days    Minutes of Exercise per Session: 10 min  Stress: No Stress Concern Present (07/17/2021)   [place]    Feeling of Stress : Not at all  Social Connections: Socially Isolated (07/17/2021)   Social Connection and Isolation Panel [NHANES]    Frequency of Communication with Friends and Family:  More than three times a week    Frequency of Social Gatherings with Friends and Family: More than three times a week    Attends Religious Services: Never    Marine scientist or Organizations: No    Attends Archivist Meetings: Never    Marital Status: Widowed  Intimate Partner Violence: Not At Risk (07/17/2021)   Humiliation, Afraid, Rape, and Kick questionnaire    Fear of Current or Ex-Partner: No    Emotionally Abused: No    Physically Abused: No    Sexually Abused: No   Family Status  Relation Name Status   Father  Deceased   Mother  Deceased   Brother  Deceased   Brother  Deceased   Sister  Deceased   Neg Hx  (Not Specified)   Family History  Problem Relation Age of Onset   Heart disease Father        myocardial infarction - died 52   Diabetes Father    Heart disease Mother        myocardial infarction   Heart disease Brother        x2   Diabetes Brother    Diabetes Brother    Diabetes Sister    Breast cancer Neg Hx    Colon cancer Neg Hx    Allergies  Allergen Reactions   Mysoline [Primidone] Other (See Comments)    "unknown"   Penicillins Rash    Patient Care Team: Bacigalupo, Dionne Bucy, MD as PCP - General (Family Medicine) Kate Sable, MD as PCP - Cardiology (Cardiology) Leandrew Koyanagi, MD as Referring Physician (Ophthalmology) Brendolyn Patty, MD (Dermatology) Concepcion Elk., MD (Dentistry)   Medications: Outpatient Medications Prior to Visit  Medication Sig   levothyroxine (SYNTHROID) 75 MCG tablet TAKE 1 TABLET BY MOUTH EVERY OTHER DAY.   levothyroxine (SYNTHROID) 88 MCG tablet Take 1 tablet by mouth every other day. Please schedule office visit before any future refill.   No facility-administered medications prior to visit.    Review of Systems  All other systems reviewed and are negative.   Last CBC Lab Results  Component Value Date   WBC 7.5 10/19/2020   HGB 13.3 10/19/2020   HCT 40.0 10/19/2020   MCV  91 10/19/2020   MCH 30.4 10/19/2020   RDW 12.5 10/19/2020   PLT 297 41/66/0630   Last metabolic panel Lab Results  Component Value Date   GLUCOSE 90 10/19/2020   NA 142 10/19/2020   K 4.4 10/19/2020   CL 100 10/19/2020   CO2 26 10/19/2020   BUN 14 10/19/2020   CREATININE 0.83 10/19/2020   EGFR 67 10/19/2020   CALCIUM 9.3 10/19/2020   PROT 6.8 10/19/2020   ALBUMIN 4.4 10/29/2020   LABGLOB 2.2 10/19/2020   AGRATIO 2.1 10/19/2020   BILITOT 0.4 10/19/2020   ALKPHOS 85 10/19/2020  AST 16 10/19/2020   ALT 23 10/19/2020   ANIONGAP 11 09/04/2015   Last lipids Lab Results  Component Value Date   CHOL 234 (H) 06/28/2020   HDL 65 06/28/2020   LDLCALC 127 (H) 06/28/2020   LDLDIRECT 130.6 06/22/2013   TRIG 242 (H) 06/28/2020   CHOLHDL 3.6 06/28/2020   Last hemoglobin A1c Lab Results  Component Value Date   HGBA1C 5.6 11/30/2020   Last thyroid functions Lab Results  Component Value Date   TSH 1.060 10/29/2020   Last vitamin D Lab Results  Component Value Date   VD25OH 39.8 06/28/2020   Last vitamin B12 and Folate Lab Results  Component Value Date   VITAMINB12 458 10/19/2020      Objective    There were no vitals taken for this visit. BP Readings from Last 3 Encounters:  07/30/21 138/69  07/17/21 126/60  07/01/21 116/60   Wt Readings from Last 3 Encounters:  07/30/21 114 lb 9.6 oz (52 kg)  07/17/21 114 lb 6.4 oz (51.9 kg)  07/01/21 114 lb 6.4 oz (51.9 kg)       Physical Exam  ***  Last depression screening scores    07/17/2021   11:14 AM 03/19/2021    1:18 PM 11/30/2020    8:24 AM  PHQ 2/9 Scores  PHQ - 2 Score 2 2 0  PHQ- 9 Score  2 1   Last fall risk screening    07/17/2021   11:18 AM  [place] in the past year? 0  Number falls in past yr: 0  Injury with Fall? 0  Risk for fall due to : No Fall Risks  Follow up Falls evaluation completed   Last Audit-C alcohol use screening    07/17/2021   11:13 AM  Alcohol Use Disorder  Test (AUDIT)  1. How often do you have a drink containing alcohol? 1  2. How many drinks containing alcohol do you have on a typical day when you are drinking? 0   A score of 3 or more in women, and 4 or more in men indicates increased risk for alcohol abuse, EXCEPT if all of the points are from question 1   No results found for any visits on 07/31/22.  Assessment & Plan    Routine Health Maintenance and Physical Exam  Exercise Activities and Dietary recommendations  Goals      DIET - EAT MORE FRUITS AND VEGETABLES     DIET - INCREASE WATER INTAKE     Recommend to drink at least 6-8 8oz glasses of water per day.      Healthy Lifestyle     Maintain drinking plenty of fluids, exercise regimen and low carb foods.         Immunization History  Administered Date(s) Administered   Fluad Quad(high Dose 65+) 05/24/2020   Influenza Inj Mdck Quad Pf 05/15/2022   Influenza Split 04/27/2012, 05/16/2013, 05/11/2014   Influenza, High Dose Seasonal PF 03/30/2018   Influenza,inj,Quad PF,6+ Mos 06/11/2017   Influenza-Unspecified 04/24/2014, 06/04/2015, 06/24/2016, 05/25/2019   PFIZER(Purple Top)SARS-COV-2 Vaccination 08/08/2019, 08/29/2019, 06/08/2020   Pneumococcal Conjugate-13 07/04/2013   Pneumococcal Polysaccharide-23 09/12/2016    Health Maintenance  Topic Date Due   DTaP/Tdap/Td (1 - Tdap) Never done   Zoster Vaccines- Shingrix (1 of 2) Never done   FOOT EXAM  12/07/2020   HEMOGLOBIN A1C  06/02/2021   COVID-19 Vaccine (4 - 2023-24 season) 03/28/2022   Medicare Annual Wellness (AWV)  07/17/2022  OPHTHALMOLOGY EXAM  06/03/2023   DEXA SCAN  08/20/2023   Pneumonia Vaccine 72+ Years old  Completed   INFLUENZA VACCINE  Completed   HPV VACCINES  Aged Out   MAMMOGRAM  Discontinued    Discussed health benefits of physical activity, and encouraged her to engage in regular exercise appropriate for her age and condition.  ***  No follow-ups on file.     {provider  attestation***:1}   Lavon Paganini, MD  [facility] [phone] (phone) [phone] (fax)  [place]

## 2022-07-30 ENCOUNTER — Ambulatory Visit (INDEPENDENT_AMBULATORY_CARE_PROVIDER_SITE_OTHER): Payer: Medicare Other

## 2022-07-30 VITALS — Wt 114.0 lb

## 2022-07-30 DIAGNOSIS — Z0000 Encounter for general adult medical examination without abnormal findings: Secondary | ICD-10-CM

## 2022-07-30 NOTE — Progress Notes (Signed)
Virtual Visit via Telephone Note  I connected with  Shannon Mckinney on 07/30/22 at  1:00 PM EST by telephone and verified that I am speaking with the correct person using two identifiers.  Location: Patient: home Provider: [facility] Persons participating in the virtual visit: [name]   I discussed the limitations, risks, security and privacy concerns of performing an evaluation and management service by telephone and the availability of in person appointments. The patient expressed understanding and agreed to proceed.  Interactive audio and video telecommunications were attempted between this nurse and patient, however failed, due to patient having technical difficulties OR patient did not have access to video capability.  We continued and completed visit with audio only.  Some vital signs may be absent or patient reported.   Dionisio David, LPN  Subjective:   Shannon Mckinney is a 89 y.o. female who presents for Medicare Annual (Subsequent) preventive examination.  Review of Systems           Objective:    There were no vitals filed for this visit. There is no height or weight on file to calculate BMI.     07/17/2021   11:17 AM 06/28/2020    9:50 AM 05/30/2020    8:57 AM 05/09/2020   10:18 AM 06/27/2019    1:29 PM 06/21/2018   10:09 AM 06/11/2017    1:11 PM  Advanced Directives  Does Patient Have a Medical Advance Directive? No Yes Yes Yes Yes Yes Yes  Type of Advance Directive  Living will [place];Living will [place];Living will [place];Living will Living will;Healthcare Power of Attorney Living will  Does patient want to make changes to medical advance directive?   No - Patient declined No - Patient declined   No - Patient declined  Copy of [place] in Chart?   No - copy requested No - copy requested No - copy requested No - copy requested   Would patient like information on  creating a medical advance directive? No - Patient declined          Current Medications (verified) Outpatient Encounter Medications as of 07/30/2022  Medication Sig   levothyroxine (SYNTHROID) 75 MCG tablet TAKE 1 TABLET BY MOUTH EVERY OTHER DAY.   levothyroxine (SYNTHROID) 88 MCG tablet Take 1 tablet by mouth every other day. Please schedule office visit before any future refill.   No facility-administered encounter medications on file as of 07/30/2022.    Allergies (verified) Mysoline [primidone] and Penicillins   History: Past Medical History:  Diagnosis Date   Allergy    Arthritis    Benign head tremor    Diabetes mellitus (HCC)    diet controlled   Fibrocystic breast disease    Heart murmur    History of abnormal Pap smear    Hx of basal cell carcinoma 11/06/2006   R nasolabial, excised 11/24/2006   Hx of basal cell carcinoma 04/10/2009   Mid nasal dorsum   Hx of basal cell carcinoma 01/26/2017   Top of L shoulder   Hypercholesterolemia    Hyperthyroidism    s/p ablation   Squamous cell carcinoma of skin 01/21/2016   R temple above lat brow   Squamous cell carcinoma of skin 06/23/2016   L upper lip, excised 08/04/2016   Past Surgical History:  Procedure Laterality Date   BREAST BIOPSY  70's   benign   CARPAL TUNNEL RELEASE  right hand   CATARACT EXTRACTION W/PHACO Right 05/09/2020   Procedure: CATARACT EXTRACTION PHACO AND INTRAOCULAR LENS PLACEMENT ([facility]) RIGHT DIABETIC 10.17  01:29.9  11.3%;  Surgeon: Leandrew Koyanagi, MD;  Location: [place];  Service: Ophthalmology;  Laterality: Right;   CATARACT EXTRACTION W/PHACO Left 05/30/2020   Procedure: CATARACT EXTRACTION PHACO AND INTRAOCULAR LENS PLACEMENT ([place]) LEFT DIABETIC;  Surgeon: Leandrew Koyanagi, MD;  Location: [place];  Service: Ophthalmology;  Laterality: Left;  12.20 1:24.7 14.4%   KNEE ARTHROSCOPY Left 09/12/2015   Procedure: ARTHROSCOPY LEFT KNEE, PARTIAL MEDIAL  MENISECTOMY, PARTIAL LATERAL MENISECTOMY, CHONDROPLASTY MEDIAL AND LATERAL;  Surgeon: Dereck Leep, MD;  Location: [facility];  Service: Orthopedics;  Laterality: Left;   SHOULDER ARTHROSCOPY     TONSILLECTOMY     Family History  Problem Relation Age of Onset   Heart disease Father        myocardial infarction - died 18   Diabetes Father    Heart disease Mother        myocardial infarction   Heart disease Brother        x2   Diabetes Brother    Diabetes Brother    Diabetes Sister    Breast cancer Neg Hx    Colon cancer Neg Hx    Social History   Socioeconomic History   Marital status: Widowed    Spouse name: Not on file   Number of children: 2   Years of education: Not on file   Highest education level: Bachelor's degree (e.g., BA, AB, BS)  Occupational History   Occupation: retired Arboriculturist   Tobacco Use   Smoking status: Never   Smokeless tobacco: Never  Vaping Use   Vaping Use: Never used  Substance and Sexual Activity   Alcohol use: Yes    Alcohol/week: 0.0 standard drinks of alcohol    Comment: rare glass of wine   Drug use: No   Sexual activity: Not Currently  Other Topics Concern   Not on file  Social History Narrative   Not on file   Social Determinants of Health   Financial Resource Strain: Low Risk  (07/17/2021)   Overall Financial Resource Strain (CARDIA)    Difficulty of Paying Living Expenses: Not hard at all  Food Insecurity: No Food Insecurity (07/17/2021)   Hunger Vital Sign    Worried About Running Out of Food in the Last Year: Never true    Ran Out of Food in the Last Year: Never true  Transportation Needs: No Transportation Needs (07/17/2021)   PRAPARE - Hydrologist (Medical): No    Lack of Transportation (Non-Medical): No  Physical Activity: Insufficiently Active (07/17/2021)   Exercise Vital Sign    Days of Exercise per Week: 3 days    Minutes of Exercise per Session: 10 min  Stress: No Stress  Concern Present (07/17/2021)   [place]    Feeling of Stress : Not at all  Social Connections: Socially Isolated (07/17/2021)   Social Connection and Isolation Panel [NHANES]    Frequency of Communication with Friends and Family: More than three times a week    Frequency of Social Gatherings with Friends and Family: More than three times a week    Attends Religious Services: Never    Marine scientist or Organizations: No    Attends Archivist Meetings: Never    Marital Status: Widowed    Tobacco  Counseling Counseling given: Not Answered   Clinical Intake:  Pre-visit preparation completed: Yes  Pain : No/denies pain     Nutritional Risks: None Diabetes: No  How often do you need to have someone help you when you read instructions, pamphlets, or other written materials from your doctor or pharmacy?: 1 - Never  Diabetic?no  Interpreter Needed?: No  Information entered by :: Kirke Shaggy, LPN   Activities of Daily Living     No data to display          Patient Care Team: Virginia Crews, MD as PCP - General (Family Medicine) Kate Sable, MD as PCP - Cardiology (Cardiology) Leandrew Koyanagi, MD as Referring Physician (Ophthalmology) Brendolyn Patty, MD (Dermatology) Concepcion Elk., MD (Dentistry)  Indicate any recent Medical Services you may have received from other than [facility] providers in the past year (date may be approximate).     Assessment:   This is a routine wellness examination for [place].  Hearing/Vision screen No results found.  Dietary issues and exercise activities discussed:     Goals Addressed   None    Depression Screen    07/17/2021   11:14 AM 03/19/2021    1:18 PM 11/30/2020    8:24 AM 06/28/2020    9:48 AM 06/27/2019    1:30 PM 06/21/2018   10:09 AM 03/30/2018    8:08 AM  PHQ 2/9 Scores  PHQ - 2 Score 2 2 0 1 1 1  0  PHQ- 9 Score   2 1    0    Fall Risk    07/17/2021   11:18 AM 11/30/2020    8:24 AM 06/28/2020    9:50 AM 06/27/2019    1:29 PM 01/25/2019    8:34 AM  Fall Risk   Falls in the past year? 0 0 0 0 0  Number falls in past yr: 0 0 0 0   Injury with Fall? 0 0 0 0   Risk for fall due to : No Fall Risks No Fall Risks     Follow up Falls evaluation completed Falls evaluation completed       FALL RISK PREVENTION PERTAINING TO THE HOME:  Any stairs in or around the home? No  If so, are there any without handrails? No  Home free of loose throw rugs in walkways, pet beds, electrical cords, etc? Yes  Adequate lighting in your home to reduce risk of falls? Yes   ASSISTIVE DEVICES UTILIZED TO PREVENT FALLS:  Life alert? No  Use of a cane, walker or w/c? Yes  Grab bars in the bathroom? Yes  Shower chair or bench in shower? Yes  Elevated toilet seat or a handicapped toilet? Yes   Cognitive Function:    06/11/2017    1:26 PM 06/06/2016    2:22 PM  MMSE - Mini Mental State Exam  Orientation to time 5 5  Orientation to Place 5 5  Registration 3 3  Attention/ Calculation 5 5  Recall 1 3  Language- name 2 objects 2 2  Language- repeat 1 1  Language- follow 3 step command 3 3  Language- read & follow direction 1 1  Write a sentence 1 1  Copy design 1 1  Total score 28 30        06/28/2020    2:17 PM 06/27/2019    1:35 PM 06/21/2018   10:17 AM 06/06/2016    2:31 PM  6CIT Screen  What Year? 0 points 0  points 0 points 0 points  What month? 0 points 0 points 0 points 0 points  What time? 0 points 0 points 0 points 0 points  Count back from 20 0 points 0 points 0 points 0 points  Months in reverse 0 points 0 points 0 points 0 points  Repeat phrase 4 points 0 points 0 points 0 points  Total Score 4 points 0 points 0 points 0 points    Immunizations Immunization History  Administered Date(s) Administered   Fluad Quad(high Dose 65+) 05/24/2020   Influenza Inj Mdck Quad Pf 05/15/2022    Influenza Split 04/27/2012, 05/16/2013, 05/11/2014   Influenza, High Dose Seasonal PF 03/30/2018   Influenza,inj,Quad PF,6+ Mos 06/11/2017   Influenza-Unspecified 04/24/2014, 06/04/2015, 06/24/2016, 05/25/2019   PFIZER(Purple Top)SARS-COV-2 Vaccination 08/08/2019, 08/29/2019, 06/08/2020   Pneumococcal Conjugate-13 07/04/2013   Pneumococcal Polysaccharide-23 09/12/2016    TDAP status: Due, Education has been provided regarding the importance of this vaccine. Advised may receive this vaccine at local pharmacy or Health Dept. Aware to provide a copy of the vaccination record if obtained from local pharmacy or Health Dept. Verbalized acceptance and understanding.  Flu Vaccine status: Up to date  Pneumococcal vaccine status: Up to date  Covid-19 vaccine status: Completed vaccines  Qualifies for Shingles Vaccine? Yes   Zostavax completed No   Shingrix Completed?: No.    Education has been provided regarding the importance of this vaccine. Patient has been advised to call insurance company to determine out of pocket expense if they have not yet received this vaccine. Advised may also receive vaccine at local pharmacy or Health Dept. Verbalized acceptance and understanding.  Screening Tests Health Maintenance  Topic Date Due   DTaP/Tdap/Td (1 - Tdap) Never done   Zoster Vaccines- Shingrix (1 of 2) Never done   FOOT EXAM  12/07/2020   HEMOGLOBIN A1C  06/02/2021   COVID-19 Vaccine (4 - 2023-24 season) 03/28/2022   OPHTHALMOLOGY EXAM  06/03/2023   Medicare Annual Wellness (AWV)  07/31/2023   DEXA SCAN  08/20/2023   Pneumonia Vaccine 9+ Years old  Completed   INFLUENZA VACCINE  Completed   HPV VACCINES  Aged Out   MAMMOGRAM  Discontinued    Health Maintenance  Health Maintenance Due  Topic Date Due   DTaP/Tdap/Td (1 - Tdap) Never done   Zoster Vaccines- Shingrix (1 of 2) Never done   FOOT EXAM  12/07/2020   HEMOGLOBIN A1C  06/02/2021   COVID-19 Vaccine (4 - 2023-24 season)  03/28/2022    Colorectal cancer screening: No longer required.   Mammogram status: No longer required due to age.  Lung Cancer Screening: (Low Dose CT Chest recommended if Age 17-80 years, 30 pack-year currently smoking OR have quit w/in 15years.) does not qualify.   Additional Screening:  Hepatitis C Screening: does not qualify; Completed no  Vision Screening: Recommended annual ophthalmology exams for early detection of glaucoma and other disorders of the eye. Is the patient up to date with their annual eye exam?  Yes  Who is the provider or what is the name of the office in which the patient attends annual eye exams? Dr.Brasington If pt is not established with a provider, would they like to be referred to a provider to establish care? No .   Dental Screening: Recommended annual dental exams for proper oral hygiene  Community Resource Referral / Chronic Care Management: CRR required this visit?  No   CCM required this visit?  No      Plan:  I have personally reviewed and noted the following in the patient's chart:   Medical and social history Use of alcohol, tobacco or illicit drugs  Current medications and supplements including opioid prescriptions. Patient is not currently taking opioid prescriptions. Functional ability and status Nutritional status Physical activity Advanced directives List of other physicians Hospitalizations, surgeries, and ER visits in previous 12 months Vitals Screenings to include cognitive, depression, and falls Referrals and appointments  In addition, I have reviewed and discussed with patient certain preventive protocols, quality metrics, and best practice recommendations. A written personalized care plan for preventive services as well as general preventive health recommendations were provided to patient.     Dionisio David, LPN   11/30/9739   Nurse Notes: none

## 2022-07-30 NOTE — Patient Instructions (Signed)
Shannon Mckinney , Thank you for taking time to come for your Medicare Wellness Visit. I appreciate your ongoing commitment to your health goals. Please review the following plan we discussed and let me know if I can assist you in the future.   Screening recommendations/referrals: Colonoscopy: aged out Mammogram: aged out Bone Density: aged out Recommended yearly ophthalmology/optometry visit for glaucoma screening and checkup Recommended yearly dental visit for hygiene and checkup  Vaccinations: Influenza vaccine: 05/15/22 Pneumococcal vaccine: 09/12/16 Tdap vaccine: n/d Shingles vaccine: n/d   Covid-19:08/08/19, 08/29/19, 06/08/20  Advanced directives: no  Conditions/risks identified: none  Next appointment: Follow up in one year for your annual wellness visit 08/03/23 @ 10:45 am by phone   Preventive Care 67 Years and Older, Female Preventive care refers to lifestyle choices and visits with your health care provider that can promote health and wellness. What does preventive care include? A yearly physical exam. This is also called an annual well check. Dental exams once or twice a year. Routine eye exams. Ask your health care provider how often you should have your eyes checked. Personal lifestyle choices, including: Daily care of your teeth and gums. Regular physical activity. Eating a healthy diet. Avoiding tobacco and drug use. Limiting alcohol use. Practicing safe sex. Taking low-dose aspirin every day. Taking vitamin and mineral supplements as recommended by your health care provider. What happens during an annual well check? The services and screenings done by your health care provider during your annual well check will depend on your age, overall health, lifestyle risk factors, and family history of disease. Counseling  Your health care provider may ask you questions about your: Alcohol use. Tobacco use. Drug use. Emotional well-being. Home and relationship  well-being. Sexual activity. Eating habits. History of falls. Memory and ability to understand (cognition). Work and work Statistician. Reproductive health. Screening  You may have the following tests or measurements: Height, weight, and BMI. Blood pressure. Lipid and cholesterol levels. These may be checked every 5 years, or more frequently if you are over 40 years old. Skin check. Lung cancer screening. You may have this screening every year starting at age 37 if you have a 30-pack-year history of smoking and currently smoke or have quit within the past 15 years. Fecal occult blood test (FOBT) of the stool. You may have this test every year starting at age 12. Flexible sigmoidoscopy or colonoscopy. You may have a sigmoidoscopy every 5 years or a colonoscopy every 10 years starting at age 41. Hepatitis C blood test. Hepatitis B blood test. Sexually transmitted disease (STD) testing. Diabetes screening. This is done by checking your blood sugar (glucose) after you have not eaten for a while (fasting). You may have this done every 1-3 years. Bone density scan. This is done to screen for osteoporosis. You may have this done starting at age 50. Mammogram. This may be done every 1-2 years. Talk to your health care provider about how often you should have regular mammograms. Talk with your health care provider about your test results, treatment options, and if necessary, the need for more tests. Vaccines  Your health care provider may recommend certain vaccines, such as: Influenza vaccine. This is recommended every year. Tetanus, diphtheria, and acellular pertussis (Tdap, Td) vaccine. You may need a Td booster every 10 years. Zoster vaccine. You may need this after age 13. Pneumococcal 13-valent conjugate (PCV13) vaccine. One dose is recommended after age 54. Pneumococcal polysaccharide (PPSV23) vaccine. One dose is recommended after age 22. Talk to  your health care provider about which  screenings and vaccines you need and how often you need them. This information is not intended to replace advice given to you by your health care provider. Make sure you discuss any questions you have with your health care provider. Document Released: 08/10/2015 Document Revised: 04/02/2016 Document Reviewed: 05/15/2015 Elsevier Interactive Patient Education  2017 [place] Prevention in the Home Falls can cause injuries. They can happen to people of all ages. There are many things you can do to make your home safe and to help prevent falls. What can I do on the outside of my home? Regularly fix the edges of walkways and driveways and fix any cracks. Remove anything that might make you trip as you walk through a door, such as a raised step or threshold. Trim any bushes or trees on the path to your home. Use bright outdoor lighting. Clear any walking paths of anything that might make someone trip, such as rocks or tools. Regularly check to see if handrails are loose or broken. Make sure that both sides of any steps have handrails. Any raised decks and porches should have guardrails on the edges. Have any leaves, snow, or ice cleared regularly. Use sand or salt on walking paths during winter. Clean up any spills in your garage right away. This includes oil or grease spills. What can I do in the bathroom? Use night lights. Install grab bars by the toilet and in the tub and shower. Do not use towel bars as grab bars. Use non-skid mats or decals in the tub or shower. If you need to sit down in the shower, use a plastic, non-slip stool. Keep the floor dry. Clean up any water that spills on the floor as soon as it happens. Remove soap buildup in the tub or shower regularly. Attach bath mats securely with double-sided non-slip rug tape. Do not have throw rugs and other things on the floor that can make you trip. What can I do in the bedroom? Use night lights. Make sure that you have a  light by your bed that is easy to reach. Do not use any sheets or blankets that are too big for your bed. They should not hang down onto the floor. Have a firm chair that has side arms. You can use this for support while you get dressed. Do not have throw rugs and other things on the floor that can make you trip. What can I do in the kitchen? Clean up any spills right away. Avoid walking on wet floors. Keep items that you use a lot in easy-to-reach places. If you need to reach something above you, use a strong step stool that has a grab bar. Keep electrical cords out of the way. Do not use floor polish or wax that makes floors slippery. If you must use wax, use non-skid floor wax. Do not have throw rugs and other things on the floor that can make you trip. What can I do with my stairs? Do not leave any items on the stairs. Make sure that there are handrails on both sides of the stairs and use them. Fix handrails that are broken or loose. Make sure that handrails are as long as the stairways. Check any carpeting to make sure that it is firmly attached to the stairs. Fix any carpet that is loose or worn. Avoid having throw rugs at the top or bottom of the stairs. If you do have throw rugs, attach  them to the floor with carpet tape. Make sure that you have a light switch at the top of the stairs and the bottom of the stairs. If you do not have them, ask someone to add them for you. What else can I do to help prevent falls? Wear shoes that: Do not have high heels. Have rubber bottoms. Are comfortable and fit you well. Are closed at the toe. Do not wear sandals. If you use a stepladder: Make sure that it is fully opened. Do not climb a closed stepladder. Make sure that both sides of the stepladder are locked into place. Ask someone to hold it for you, if possible. Clearly mark and make sure that you can see: Any grab bars or handrails. First and last steps. Where the edge of each step  is. Use tools that help you move around (mobility aids) if they are needed. These include: Canes. Walkers. Scooters. Crutches. Turn on the lights when you go into a dark area. Replace any light bulbs as soon as they burn out. Set up your furniture so you have a clear path. Avoid moving your furniture around. If any of your floors are uneven, fix them. If there are any pets around you, be aware of where they are. Review your medicines with your doctor. Some medicines can make you feel dizzy. This can increase your chance of falling. Ask your doctor what other things that you can do to help prevent falls. This information is not intended to replace advice given to you by your health care provider. Make sure you discuss any questions you have with your health care provider. Document Released: 05/10/2009 Document Revised: 12/20/2015 Document Reviewed: 08/18/2014 Elsevier Interactive Patient Education  2017 [employer].

## 2022-07-31 ENCOUNTER — Encounter: Payer: Self-pay | Admitting: Family Medicine

## 2022-07-31 ENCOUNTER — Ambulatory Visit (INDEPENDENT_AMBULATORY_CARE_PROVIDER_SITE_OTHER): Payer: Medicare Other | Admitting: Family Medicine

## 2022-07-31 VITALS — BP 126/61 | HR 66 | Temp 97.6°F | Resp 16 | Ht 61.0 in | Wt 113.4 lb

## 2022-07-31 DIAGNOSIS — E7800 Pure hypercholesterolemia, unspecified: Secondary | ICD-10-CM | POA: Diagnosis not present

## 2022-07-31 DIAGNOSIS — E039 Hypothyroidism, unspecified: Secondary | ICD-10-CM

## 2022-07-31 DIAGNOSIS — E119 Type 2 diabetes mellitus without complications: Secondary | ICD-10-CM

## 2022-07-31 DIAGNOSIS — E559 Vitamin D deficiency, unspecified: Secondary | ICD-10-CM | POA: Diagnosis not present

## 2022-07-31 DIAGNOSIS — M8580 Other specified disorders of bone density and structure, unspecified site: Secondary | ICD-10-CM

## 2022-07-31 DIAGNOSIS — E890 Postprocedural hypothyroidism: Secondary | ICD-10-CM

## 2022-07-31 DIAGNOSIS — Z0000 Encounter for general adult medical examination without abnormal findings: Secondary | ICD-10-CM

## 2022-07-31 NOTE — Assessment & Plan Note (Signed)
Reviewed last lipid panel Not currently on a statin Recheck FLP and CMP Discussed diet and exercise  

## 2022-07-31 NOTE — Assessment & Plan Note (Signed)
Continue supplement Recheck level  Stop rechecking DEXA as it won't change management at this time with advanced age

## 2022-07-31 NOTE — Assessment & Plan Note (Signed)
Well controlled  Recheck A1c Continue diet control UTD on vaccines, eye exam, foot exam completed today Discussed diet and exercise

## 2022-07-31 NOTE — Assessment & Plan Note (Signed)
Previously well controlled Continue Synthroid at current dose  Recheck TSH and adjust Synthroid as indicated   

## 2022-07-31 NOTE — Assessment & Plan Note (Signed)
Continue supplement Recheck level 

## 2022-08-01 LAB — LIPID PANEL WITH LDL/HDL RATIO
Cholesterol, Total: 307 mg/dL — ABNORMAL HIGH (ref 100–199)
HDL: 87 mg/dL (ref >39)
LDL Chol Calc (NIH): 201 mg/dL — ABNORMAL HIGH (ref 0–99)
LDL/HDL Ratio: 2.3 ratio (ref 0.0–3.2)
Triglycerides: 110 mg/dL (ref 0–149)
VLDL Cholesterol Cal: 19 mg/dL (ref 5–40)

## 2022-08-01 LAB — TSH: TSH: 4.68 u[IU]/mL — ABNORMAL HIGH (ref 0.450–4.500)

## 2022-08-01 LAB — COMPREHENSIVE METABOLIC PANEL
ALT: 11 IU/L (ref 0–32)
AST: 15 IU/L (ref 0–40)
Albumin/Globulin Ratio: 1.8 (ref 1.2–2.2)
Albumin: 4.4 g/dL (ref 3.6–4.6)
Alkaline Phosphatase: 75 IU/L (ref 44–121)
BUN/Creatinine Ratio: 22 (ref 12–28)
BUN: 17 mg/dL (ref 10–36)
Bilirubin Total: 0.7 mg/dL (ref 0.0–1.2)
CO2: 25 mmol/L (ref 20–29)
Calcium: 9.3 mg/dL (ref 8.7–10.3)
Chloride: 98 mmol/L (ref 96–106)
Creatinine, Ser: 0.77 mg/dL (ref 0.57–1.00)
Globulin, Total: 2.4 g/dL (ref 1.5–4.5)
Glucose: 107 mg/dL — ABNORMAL HIGH (ref 70–99)
Potassium: 4.2 mmol/L (ref 3.5–5.2)
Sodium: 139 mmol/L (ref 134–144)
Total Protein: 6.8 g/dL (ref 6.0–8.5)
eGFR: 73 mL/min/{1.73_m2} (ref >59)

## 2022-08-01 LAB — HEMOGLOBIN A1C
Est. average glucose Bld gHb Est-mCnc: 128 mg/dL
Hgb A1c MFr Bld: 6.1 % — ABNORMAL HIGH (ref 4.8–5.6)

## 2022-08-01 LAB — VITAMIN D 25 HYDROXY (VIT D DEFICIENCY, FRACTURES): Vit D, 25-Hydroxy: 28.8 ng/mL — ABNORMAL LOW (ref 30.0–100.0)

## 2022-08-12 ENCOUNTER — Telehealth: Payer: Self-pay | Admitting: *Deleted

## 2022-08-12 NOTE — Telephone Encounter (Signed)
.   Kara Mead' caregiver for pt calling for clarification regarding lab results of 07/31/22. Pt present as well. Reviewed result note, verbalizes understanding.

## 2022-08-26 ENCOUNTER — Ambulatory Visit (INDEPENDENT_AMBULATORY_CARE_PROVIDER_SITE_OTHER): Payer: Medicare Other | Admitting: Dermatology

## 2022-08-26 DIAGNOSIS — D2239 Melanocytic nevi of other parts of face: Secondary | ICD-10-CM | POA: Diagnosis not present

## 2022-08-26 DIAGNOSIS — L578 Other skin changes due to chronic exposure to nonionizing radiation: Secondary | ICD-10-CM

## 2022-08-26 DIAGNOSIS — Z1283 Encounter for screening for malignant neoplasm of skin: Secondary | ICD-10-CM

## 2022-08-26 DIAGNOSIS — Z85828 Personal history of other malignant neoplasm of skin: Secondary | ICD-10-CM

## 2022-08-26 DIAGNOSIS — L820 Inflamed seborrheic keratosis: Secondary | ICD-10-CM

## 2022-08-26 DIAGNOSIS — D224 Melanocytic nevi of scalp and neck: Secondary | ICD-10-CM | POA: Diagnosis not present

## 2022-08-26 DIAGNOSIS — D229 Melanocytic nevi, unspecified: Secondary | ICD-10-CM

## 2022-08-26 NOTE — Progress Notes (Signed)
Follow-Up Visit   Subjective  Shannon Mckinney is a 89 y.o. female who presents for the following: Annual Exam.  The patient presents for Total-Body Skin Exam (TBSE) for skin cancer screening and mole check.  The patient has spots, moles and lesions to be evaluated, some may be new or changing. She has a history of BCCs and SCCs. She has a growth on her neck that she picks at.  Patient accompanied by caregiver, Terrence Dupont.   The following portions of the chart were reviewed this encounter and updated as appropriate:       Review of Systems:  No other skin or systemic complaints except as noted in HPI or Assessment and Plan.  Objective  Well appearing patient in no apparent distress; mood and affect are within normal limits.  A full examination was performed including scalp, head, eyes, ears, nose, lips, neck, chest, axillae, abdomen, back, buttocks, bilateral upper extremities, bilateral lower extremities, hands, feet, fingers, toes, fingernails, and toenails. All findings within normal limits unless otherwise noted below.  R nasal tip 1.51mm firm flesh papule  Anterior Neck 5.0 x 4.48mm brown macule, darker center   R anterior lower neck x 1 Erythematous stuck-on, waxy papule    Assessment & Plan  Skin cancer screening performed today.  Actinic Damage - chronic, secondary to cumulative UV radiation exposure/sun exposure over time - diffuse scaly erythematous macules with underlying dyspigmentation - Recommend daily broad spectrum sunscreen SPF 30+ to sun-exposed areas, reapply every 2 hours as needed.  - Recommend staying in the shade or wearing long sleeves, sun glasses (UVA+UVB protection) and wide brim hats (4-inch brim around the entire circumference of the hat). - Call for new or changing lesions.  History of Basal Cell Carcinoma of the Skin - No evidence of recurrence today - Recommend regular full body skin exams - Recommend daily broad spectrum sunscreen SPF 30+ to  sun-exposed areas, reapply every 2 hours as needed.  - Call if any new or changing lesions are noted between office visits  History of Squamous Cell Carcinoma of the Skin - No evidence of recurrence today - Recommend regular full body skin exams - Recommend daily broad spectrum sunscreen SPF 30+ to sun-exposed areas, reapply every 2 hours as needed.  - Call if any new or changing lesions are noted between office visits  Lentigines - Scattered tan macules - Due to sun exposure - Benign-appearing, observe - Recommend daily broad spectrum sunscreen SPF 30+ to sun-exposed areas, reapply every 2 hours as needed. - Call for any changes  Seborrheic Keratoses - Stuck-on, waxy, tan-brown papules and/or plaques  - Benign-appearing - Discussed benign etiology and prognosis. - Observe - Call for any changes  Hemangiomas - Red papules - Discussed benign nature - Observe - Call for any changes  Melanocytic Nevi - Tan-brown and/or pink-flesh-colored symmetric macules and papules - Benign appearing on exam today - Observation - Call clinic for new or changing moles - Recommend daily use of broad spectrum spf 30+ sunscreen to sun-exposed areas.   Fibrous papule of nose R nasal tip  Stable. Benign, observe.    Nevus Anterior Neck  Benign-appearing, stable.  Observation.  Call clinic for new or changing moles.  Recommend daily use of broad spectrum spf 30+ sunscreen to sun-exposed areas.   Inflamed seborrheic keratosis R anterior lower neck x 1  Symptomatic, irritating, patient would like treated.  Destruction of lesion - R anterior lower neck x 1  Destruction method: cryotherapy  Informed consent: discussed and consent obtained   Lesion destroyed using liquid nitrogen: Yes   Region frozen until ice ball extended beyond lesion: Yes   Outcome: patient tolerated procedure well with no complications   Post-procedure details: wound care instructions given   Additional details:   Prior to procedure, discussed risks of blister formation, small wound, skin dyspigmentation, or rare scar following cryotherapy. Recommend Vaseline ointment to treated areas while healing.    Return in about 1 year (around 08/27/2023) for TBSE.  Documentation: I have reviewed the above documentation for accuracy and completeness, and I agree with the above.  Brendolyn Patty MD

## 2022-08-26 NOTE — Patient Instructions (Addendum)

## 2022-09-03 ENCOUNTER — Telehealth: Payer: Self-pay | Admitting: Family Medicine

## 2022-09-03 NOTE — Telephone Encounter (Signed)
Form being sent back.Marland KitchenMarland KitchenMarland KitchenCaregiver dropped a form [employer] [place] to be completed so her trashcans can be moved up close to her home.  Please complete and call Terrence Dupont, [place],  [phone]

## 2022-09-04 NOTE — Telephone Encounter (Signed)
Emma notified ready for p/u.  Made copy to scan and left original at front desk for p/u.

## 2022-09-04 NOTE — Telephone Encounter (Signed)
Form completed and signed. Will leave up front for pick up. Please let Terrence Dupont know it's ready.

## 2022-10-21 ENCOUNTER — Other Ambulatory Visit: Payer: Self-pay | Admitting: Family Medicine

## 2022-10-21 DIAGNOSIS — E890 Postprocedural hypothyroidism: Secondary | ICD-10-CM

## 2022-10-21 MED ORDER — LEVOTHYROXINE SODIUM 88 MCG PO TABS: 88.0000 ug | ORAL_TABLET | Freq: Every day | ORAL | 0 refills | Status: DC

## 2022-10-22 NOTE — Telephone Encounter (Signed)
Unable to refill per protocol, Rx request is too soon. Last refill 10/21/22 for 90 days.  Requested Prescriptions  Pending Prescriptions Disp Refills   levothyroxine (SYNTHROID) 88 MCG tablet [Pharmacy Med Name: LEVOTHYROXINE SODIUM 88 MCG TAB] 45 tablet 0    Sig: TAKE 1 TABLET BY MOUTH EVERY OTHER DAY.     Endocrinology:  Hypothyroid Agents Failed - 10/21/2022 10:59 AM      Failed - TSH in normal range and within 360 days    TSH  Date Value Ref Range Status  07/31/2022 4.680 (H) 0.450 - 4.500 uIU/mL Final         Passed - Valid encounter within last 12 months    Recent Outpatient Visits           2 months ago Type 2 diabetes mellitus without complication, without long-term current use of insulin [facility])   [place], Dionne Bucy, MD   1 year ago Venous stasis dermatitis of both lower extremities   [place], Dionne Bucy, MD   1 year ago Frequency of urination   [place] Eulas Post, MD   1 year ago Venous insufficiency of both lower extremities   [place] Gwyneth Sprout, FNP   1 year ago Postablative hypothyroidism   [place], Dionne Bucy, MD       Future Appointments             In 1 week Bacigalupo, Dionne Bucy, MD [facility], [place]   In 9 months Bacigalupo, Dionne Bucy, MD [facility], [facility]

## 2022-10-23 ENCOUNTER — Other Ambulatory Visit: Payer: Self-pay | Admitting: Family Medicine

## 2022-10-29 ENCOUNTER — Other Ambulatory Visit: Payer: Self-pay | Admitting: Family Medicine

## 2022-10-29 ENCOUNTER — Telehealth: Payer: Self-pay

## 2022-10-29 NOTE — Telephone Encounter (Signed)
Shannon Mckinney, [facility] from [place] calling needing clarification on levothyroxine. Pt had been on 75 mcg and 88 mcg alternating QOD. Rx dose was changed and 88 mcg was sent in on 10/21/22 to QD. Advised her of this and that 75 mcg was dc'd. No further assistance was needed.

## 2022-11-03 ENCOUNTER — Ambulatory Visit: Payer: Medicare Other | Admitting: Family Medicine

## 2022-11-04 ENCOUNTER — Encounter: Payer: Self-pay | Admitting: Family Medicine

## 2022-11-04 ENCOUNTER — Ambulatory Visit (INDEPENDENT_AMBULATORY_CARE_PROVIDER_SITE_OTHER): Payer: Medicare Other | Admitting: Family Medicine

## 2022-11-04 VITALS — BP 120/69 | HR 71 | Temp 97.6°F | Resp 12 | Wt 114.0 lb

## 2022-11-04 DIAGNOSIS — E119 Type 2 diabetes mellitus without complications: Secondary | ICD-10-CM

## 2022-11-04 DIAGNOSIS — N3281 Overactive bladder: Secondary | ICD-10-CM

## 2022-11-04 DIAGNOSIS — E7800 Pure hypercholesterolemia, unspecified: Secondary | ICD-10-CM | POA: Diagnosis not present

## 2022-11-04 DIAGNOSIS — E890 Postprocedural hypothyroidism: Secondary | ICD-10-CM

## 2022-11-04 NOTE — Assessment & Plan Note (Signed)
Uncontrolled on last check Will not start statin for primary prevention at age 89

## 2022-11-04 NOTE — Assessment & Plan Note (Addendum)
Longstanding With nocturia Has tried Azo bladder control without relief - difficult to take as often as necessary Discussed oxybutinin but would like to avoid due to increase fall risk

## 2022-11-04 NOTE — Assessment & Plan Note (Signed)
Last TSH slightly elevated Patient was asymptomatic and has continued alternating synthroid 88 and 75 mcg qod Recheck TSH today and adjust synthroid based on results

## 2022-11-04 NOTE — Progress Notes (Signed)
I,Sulibeya S Dimas,acting as a scribe for Shirlee Latch, MD.,have documented all relevant documentation on the behalf of Shirlee Latch, MD,as directed by  Shirlee Latch, MD while in the presence of Shirlee Latch, MD.   Established patient visit   Patient: Shannon Mckinney   DOB: 1928-11-20   89 y.o. Female  MRN: [id number] Visit Date: 11/04/2022  Today's healthcare provider: Shirlee Latch, MD   Chief Complaint  Patient presents with   Hypothyroidism   Subjective    HPI  Hypothyroid, follow-up  Lab Results  Component Value Date   TSH 4.680 (H) 07/31/2022   TSH 1.060 10/29/2020   TSH 0.342 (L) 06/28/2020    Wt Readings from Last 3 Encounters:  11/04/22 114 lb (51.7 kg)  07/31/22 113 lb 6.4 oz (51.4 kg)  07/30/22 114 lb (51.7 kg)    She was last seen for hypothyroid 3 months ago.  Management since that visit includes patient request to stay at same dose. Kara Mead reports patient is alternating levothyroxine 75 mcg and 88 mcg every other day.  She reports excellent compliance with treatment. She is not having side effects.   Symptoms: No change in energy level No constipation  No diarrhea No heat / cold intolerance  Yes nervousness No palpitations  No weight changes    ----------------------------------------------------------------------------------------- diabetes, Follow-up  Lab Results  Component Value Date   HGBA1C 6.1 (H) 07/31/2022   HGBA1C 5.6 11/30/2020   HGBA1C 5.8 (H) 06/28/2020   GLUCOSE 107 (H) 07/31/2022   GLUCOSE 90 10/19/2020   GLUCOSE 188 (H) 06/28/2020    Last seen for for this3 months ago.  Management since that visit includes no changes. Current symptoms include none and have been stable.  Pertinent Labs:    Component Value Date/Time   CHOL 307 (H) 07/31/2022 1118   TRIG 110 07/31/2022 1118   CHOLHDL 3.6 06/28/2020 1514   CHOLHDL 5 06/24/2017 0835   CREATININE 0.77 07/31/2022 1118    Wt Readings from Last 3  Encounters:  11/04/22 114 lb (51.7 kg)  07/31/22 113 lb 6.4 oz (51.4 kg)  07/30/22 114 lb (51.7 kg)  -----------------------------------------------------------------------------------------   Medications: Outpatient Medications Prior to Visit  Medication Sig   Cholecalciferol (VITAMIN D-3 PO) Take by mouth.   levothyroxine (SYNTHROID) 75 MCG tablet Take 75 mcg by mouth every other day. Alternates with 88 mcg qod   levothyroxine (SYNTHROID) 88 MCG tablet Take 1 tablet (88 mcg total) by mouth daily.   No facility-administered medications prior to visit.    Review of Systems per HPI      Objective    BP 120/69 (BP Location: Right Arm, Patient Position: Sitting, Cuff Size: Normal)   Pulse 71   Temp 97.6 F (36.4 C) (Temporal)   Resp 12   Wt 114 lb (51.7 kg)   SpO2 98%   BMI 21.54 kg/m  BP Readings from Last 3 Encounters:  11/04/22 120/69  07/31/22 126/61  07/30/21 138/69   Wt Readings from Last 3 Encounters:  11/04/22 114 lb (51.7 kg)  07/31/22 113 lb 6.4 oz (51.4 kg)  07/30/22 114 lb (51.7 kg)      Physical Exam Vitals reviewed.  Constitutional:      General: She is not in acute distress.    Appearance: Normal appearance. She is well-developed. She is not diaphoretic.  HENT:     Head: Normocephalic and atraumatic.  Eyes:     General: No scleral icterus.    Conjunctiva/sclera: Conjunctivae normal.  Neck:     Thyroid: No thyromegaly.  Cardiovascular:     Rate and Rhythm: Normal rate and regular rhythm.     Heart sounds: Normal heart sounds. No murmur heard. Pulmonary:     Effort: Pulmonary effort is normal. No respiratory distress.     Breath sounds: Normal breath sounds. No wheezing, rhonchi or rales.  Musculoskeletal:     Cervical back: Neck supple.     Right lower leg: No edema.     Left lower leg: No edema.  Lymphadenopathy:     Cervical: No cervical adenopathy.  Skin:    General: Skin is warm and dry.     Findings: No rash.  Neurological:      Mental Status: She is alert and oriented to person, place, and time. Mental status is at baseline.  Psychiatric:        Mood and Affect: Mood normal.        Behavior: Behavior normal.       No results found for any visits on 11/04/22.  Assessment & Plan     Problem List Items Addressed This Visit       Endocrine   Diabetes mellitus    Well controlled  Recheck A1c Continue diet control UTD on screenings Discussed diet and exercise      Relevant Orders   Hemoglobin A1c   Postablative hypothyroidism - Primary    Last TSH slightly elevated Patient was asymptomatic and has continued alternating synthroid 88 and 75 mcg qod Recheck TSH today and adjust synthroid based on results      Relevant Medications   levothyroxine (SYNTHROID) 75 MCG tablet   Other Relevant Orders   TSH     Genitourinary   OAB (overactive bladder)    Longstanding With nocturia Has tried Azo bladder control without relief - difficult to take as often as necessary Discussed oxybutinin but would like to avoid due to increase fall risk        Other   Hypercholesterolemia    Uncontrolled on last check Will not start statin for primary prevention at age 3        Return in about 6 months (around 05/06/2023) for chronic disease f/u.      I, Shirlee Latch, MD, have reviewed all documentation for this visit. The documentation on 11/04/22 for the exam, diagnosis, procedures, and orders are all accurate and complete.   Nevaen Tredway, [name], MD, MPH [facility]

## 2022-11-04 NOTE — Assessment & Plan Note (Signed)
Well controlled  Recheck A1c Continue diet control UTD on screenings Discussed diet and exercise

## 2022-11-05 LAB — HEMOGLOBIN A1C
Est. average glucose Bld gHb Est-mCnc: 134 mg/dL
Hgb A1c MFr Bld: 6.3 % — ABNORMAL HIGH (ref 4.8–5.6)

## 2022-11-05 LAB — TSH: TSH: 0.663 u[IU]/mL (ref 0.450–4.500)

## 2022-11-11 ENCOUNTER — Telehealth: Payer: Self-pay | Admitting: Family Medicine

## 2022-11-11 ENCOUNTER — Ambulatory Visit: Payer: Self-pay | Admitting: *Deleted

## 2022-11-11 ENCOUNTER — Other Ambulatory Visit: Payer: Self-pay | Admitting: Family Medicine

## 2022-11-11 NOTE — Telephone Encounter (Signed)
Yes. She should alternate 75 mcg and 88 mcg qod.  I refilled the 75 mcg dose today

## 2022-11-11 NOTE — Telephone Encounter (Signed)
Requested medication (s) are due for refill today: Yes  Requested medication (s) are on the active medication list: Yes  Last refill:  11/04/22  Future visit scheduled: Yes  Notes to clinic:  Unable to refill per protocol, last refill by another provider.     Emma pt care giver went to the pharmacy and was told medication has not been called in yet. Per Kara Mead PCP was suppose to call medication into the pharmacy last week after going over pt thyroid labs.  Pt is out of medication.      Requested Prescriptions  Pending Prescriptions Disp Refills   levothyroxine (SYNTHROID) 75 MCG tablet [Pharmacy Med Name: LEVOTHYROXINE SODIUM 75 MCG TAB] 45 tablet     Sig: TAKE 1 TABLET BY MOUTH EVERY OTHER DAY.     Endocrinology:  Hypothyroid Agents Passed - 11/11/2022 10:58 AM      Passed - TSH in normal range and within 360 days    TSH  Date Value Ref Range Status  11/04/2022 0.663 0.450 - 4.500 uIU/mL Final         Passed - Valid encounter within last 12 months    Recent Outpatient Visits           1 week ago Postablative hypothyroidism   [facility] [facility] [place], [name], MD   3 months ago Type 2 diabetes mellitus without complication, without long-term current use of insulin [facility])   [facility] [place], [name], MD   1 year ago Venous stasis dermatitis of both lower extremities   [place] [facility] [place], [name], MD   1 year ago Frequency of urination   [facility] Bosie Clos, MD   1 year ago Venous insufficiency of both lower extremities   [place] [facility] Jacky Kindle, FNP       Future Appointments             In 5 months Bacigalupo, [name], MD [facility], [facility]   In 8 months Bacigalupo, [name], MD [facility], [facility]

## 2022-11-11 NOTE — Telephone Encounter (Signed)
rx concern   The patient's son would like to be contacted to review their prescription for levothyroxine (SYNTHROID) 75 MCG tablet [[id number]]  Please contact further when possible     Chief Complaint: Medication Question Symptoms: NA Frequency: NA Pertinent Negatives: Patient denies NA Disposition: ED /[] Urgent Care (no appt availability in office) / Appointment(In office/virtual)/  [facility]/ Home Care/ Refused Recommended Disposition /[] [facility]/  Follow-up with PCP Additional Notes:  Pt's son calling for clarification of med levothyroxine. Questioning if pt is alternating doses QOD, alternating with .  Reviewed last OV note and lab note, advised pt should be alternating doses. States "I thought that but she wasn't sure." RX written as :  levothyroxine (SYNTHROID) 88 MCG tablet Take 1 tablet (88 mcg total) by mouth daily. Dispense: 90 tablet, Refills: 0 ordered   10/21/2022 -- Jacky Kindle, FNP             levothyroxine (SYNTHROID) 75 MCG tablet TAKE 1 TABLET BY MOUTH EVERY OTHER DAY. Dispense: 45 tablet, Refills: 3 ordered            Assured NT would route to practice for PCPs review.   Reason for Disposition  [1] Caller has NON-URGENT medicine question about med that PCP prescribed AND [2] triager unable to answer question  Answer Assessment - Initial Assessment Questions 1. NAME of MEDICINE: "What medicine(s) are you calling about?"     Levothyroxine 2. QUESTION: "What is your question?" (e.g., double dose of medicine, side effect)     Alternating dose? 3. PRESCRIBER: "Who prescribed the medicine?" Reason: if prescribed by specialist, call should be referred to that group.     PCP  Protocols used: Medication Question Call-A-AH

## 2022-11-11 NOTE — Telephone Encounter (Signed)
Medication Refill - Medication: levothyroxine (SYNTHROID) 75 MCG tablet  Has the patient contacted their pharmacy? Yes.   Emma pt care giver went to the pharmacy and was told medication has not been called in yet. Per Kara Mead PCP was suppose to call medication into the pharmacy last week after going over pt thyroid labs.  Pt is out of medication.   Preferred Pharmacy (with phone number or street name): [facility] - [place], [place] - [address]  Phone: [phone] Fax: [phone]  Has the patient been seen for an appointment in the last year OR does the patient have an upcoming appointment? Yes.    Agent: Please be advised that RX refills may take up to 3 business days. We ask that you follow-up with your pharmacy.

## 2022-11-11 NOTE — Telephone Encounter (Signed)
Requested by pharmacy on 11/11/22 in another encounter, routed to practice on 11/11/22 for PCP approval.

## 2022-11-12 NOTE — Telephone Encounter (Signed)
Patient's son Peyton Najjar, on [place], called and advised per Dr. Beryle Flock below, he verbalized understanding.

## 2023-01-16 ENCOUNTER — Other Ambulatory Visit: Payer: Self-pay | Admitting: Family Medicine

## 2023-05-04 DIAGNOSIS — Z23 Encounter for immunization: Secondary | ICD-10-CM | POA: Diagnosis not present

## 2023-05-07 ENCOUNTER — Encounter: Payer: Self-pay | Admitting: Family Medicine

## 2023-05-07 ENCOUNTER — Ambulatory Visit (INDEPENDENT_AMBULATORY_CARE_PROVIDER_SITE_OTHER): Payer: Medicare Other | Admitting: Family Medicine

## 2023-05-07 VITALS — BP 126/60 | HR 61 | Temp 97.5°F | Resp 18 | Ht 61.0 in | Wt 117.3 lb

## 2023-05-07 DIAGNOSIS — R2689 Other abnormalities of gait and mobility: Secondary | ICD-10-CM

## 2023-05-07 DIAGNOSIS — E1169 Type 2 diabetes mellitus with other specified complication: Secondary | ICD-10-CM

## 2023-05-07 DIAGNOSIS — E119 Type 2 diabetes mellitus without complications: Secondary | ICD-10-CM | POA: Diagnosis not present

## 2023-05-07 DIAGNOSIS — E890 Postprocedural hypothyroidism: Secondary | ICD-10-CM | POA: Diagnosis not present

## 2023-05-07 DIAGNOSIS — E559 Vitamin D deficiency, unspecified: Secondary | ICD-10-CM

## 2023-05-07 DIAGNOSIS — E785 Hyperlipidemia, unspecified: Secondary | ICD-10-CM | POA: Diagnosis not present

## 2023-05-07 NOTE — Assessment & Plan Note (Signed)
Uncontrolled on last check Will not start statin for primary prevention at age 89

## 2023-05-07 NOTE — Assessment & Plan Note (Signed)
Stable on alternating doses of Synthroid and . -Check thyroid function tests today to ensure appropriate dosing.

## 2023-05-07 NOTE — Assessment & Plan Note (Signed)
Previously low vitamin D, patient not currently taking supplementation. -Check vitamin D level today. If low, restart vitamin D supplementation.

## 2023-05-07 NOTE — Progress Notes (Signed)
Established Patient Office Visit  Subjective   Patient ID: Shannon Mckinney, female    DOB: [birth year]  Age: 89 y.o. MRN: [id number]  Chief Complaint  Patient presents with   Diabetes   Hyperlipidemia    Diabetes  Hyperlipidemia    Discussed the use of AI scribe software for clinical note transcription with the patient, who gave verbal consent to proceed.  History of Present Illness   The patient, a 89 year old with a history of thyroid issues and previously elevated blood sugar, presents for a regular check-up. The patient reports no changes in diet or activity level and feels well overall. The patient lives alone and manages well with only one medication for thyroid issues. The patient's blood sugar was previously elevated but is now diet-controlled. The patient's balance has been a concern, especially when navigating the yard alone. The patient uses a walking stick for support when outside. The patient also has a history of low vitamin D levels and was previously on supplementation.         ROS    Objective:     BP 126/60 (BP Location: Right Arm, Patient Position: Sitting, Cuff Size: Normal)   Pulse 61   Temp (!) 97.5 F (36.4 C)   Resp 18   Ht 5\' 1"  (1.549 m)   Wt 117 lb 4.8 oz (53.2 kg)   SpO2 96%   BMI 22.16 kg/m    Physical Exam Vitals reviewed.  Constitutional:      General: She is not in acute distress.    Appearance: Normal appearance. She is well-developed. She is not diaphoretic.  HENT:     Head: Normocephalic and atraumatic.  Eyes:     General: No scleral icterus.    Conjunctiva/sclera: Conjunctivae normal.  Neck:     Thyroid: No thyromegaly.  Cardiovascular:     Rate and Rhythm: Normal rate and regular rhythm.     Heart sounds: Normal heart sounds. No murmur heard. Pulmonary:     Effort: Pulmonary effort is normal. No respiratory distress.     Breath sounds: Normal breath sounds. No wheezing, rhonchi or rales.  Musculoskeletal:     Cervical  back: Neck supple.     Right lower leg: No edema.     Left lower leg: No edema.  Lymphadenopathy:     Cervical: No cervical adenopathy.  Skin:    General: Skin is warm and dry.     Findings: No rash.  Neurological:     Mental Status: She is alert and oriented to person, place, and time. Mental status is at baseline.     Gait: Gait abnormal.  Psychiatric:        Mood and Affect: Mood normal.        Behavior: Behavior normal.      No results found for any visits on 05/07/23.    The ASCVD Risk score (Arnett DK, et al., 2019) failed to calculate for the following reasons:   The 2019 ASCVD risk score is only valid for ages 52 to 90    Assessment & Plan:   Problem List Items Addressed This Visit       Endocrine   Diabetes mellitus (HCC) - Primary    Well controlled  Recheck A1c Continue diet control UTD on screenings Discussed diet and exercise      Relevant Orders   Hemoglobin A1c   Urine Microalbumin w/creat. ratio   Hyperlipidemia associated with type 2 diabetes mellitus (HCC)    Uncontrolled  on last check Will not start statin for primary prevention at age 36      Relevant Orders   Lipid panel   Comprehensive metabolic panel   Postablative hypothyroidism    Stable on alternating doses of Synthroid and . -Check thyroid function tests today to ensure appropriate dosing.      Relevant Orders   TSH     Other   Vitamin D deficiency    Previously low vitamin D, patient not currently taking supplementation. -Check vitamin D level today. If low, restart vitamin D supplementation.      Relevant Orders   VITAMIN D 25 Hydroxy (Vit-D Deficiency, Fractures)   Other Visit Diagnoses     Imbalance       Relevant Orders   Ambulatory referral to Home Health   Widebased gait       Relevant Orders   Ambulatory referral to Home Health          Impaired Balance Patient lives alone and has concerns about balance, although no falls reported. Wide  gait noted on examination. -Initiate home health physical therapy for balance exercises.  General Health Maintenance -Influenza vaccine administered a few days ago. -Check cholesterol today. -Schedule follow-up visit in 6 months.        Return in about 6 months (around 11/05/2023) for chronic disease f/u.    Shirlee Latch, MD

## 2023-05-07 NOTE — Assessment & Plan Note (Signed)
Well controlled  Recheck A1c Continue diet control UTD on screenings Discussed diet and exercise

## 2023-05-08 DIAGNOSIS — E890 Postprocedural hypothyroidism: Secondary | ICD-10-CM | POA: Diagnosis not present

## 2023-05-08 DIAGNOSIS — E1169 Type 2 diabetes mellitus with other specified complication: Secondary | ICD-10-CM | POA: Diagnosis not present

## 2023-05-08 DIAGNOSIS — E559 Vitamin D deficiency, unspecified: Secondary | ICD-10-CM | POA: Diagnosis not present

## 2023-05-08 DIAGNOSIS — E785 Hyperlipidemia, unspecified: Secondary | ICD-10-CM | POA: Diagnosis not present

## 2023-05-08 DIAGNOSIS — R2689 Other abnormalities of gait and mobility: Secondary | ICD-10-CM | POA: Diagnosis not present

## 2023-05-08 DIAGNOSIS — R2681 Unsteadiness on feet: Secondary | ICD-10-CM | POA: Diagnosis not present

## 2023-05-08 LAB — COMPREHENSIVE METABOLIC PANEL
ALT: 6 [IU]/L (ref 0–32)
AST: 14 [IU]/L (ref 0–40)
Albumin: 4.6 g/dL (ref 3.6–4.6)
Alkaline Phosphatase: 70 [IU]/L (ref 44–121)
BUN/Creatinine Ratio: 24 (ref 12–28)
BUN: 18 mg/dL (ref 10–36)
Bilirubin Total: 0.6 mg/dL (ref 0.0–1.2)
CO2: 28 mmol/L (ref 20–29)
Calcium: 9.2 mg/dL (ref 8.7–10.3)
Chloride: 100 mmol/L (ref 96–106)
Creatinine, Ser: 0.76 mg/dL (ref 0.57–1.00)
Globulin, Total: 1.9 g/dL (ref 1.5–4.5)
Glucose: 105 mg/dL — ABNORMAL HIGH (ref 70–99)
Potassium: 4.2 mmol/L (ref 3.5–5.2)
Sodium: 143 mmol/L (ref 134–144)
Total Protein: 6.5 g/dL (ref 6.0–8.5)
eGFR: 73 mL/min/{1.73_m2} (ref >59)

## 2023-05-08 LAB — MICROALBUMIN / CREATININE URINE RATIO
Creatinine, Urine: 59.9 mg/dL
Microalb/Creat Ratio: 20 mg/g{creat} (ref 0–29)
Microalbumin, Urine: 11.8 ug/mL

## 2023-05-08 LAB — LIPID PANEL
Chol/HDL Ratio: 3.7 {ratio} (ref 0.0–4.4)
Cholesterol, Total: 272 mg/dL — ABNORMAL HIGH (ref 100–199)
HDL: 74 mg/dL (ref >39)
LDL Chol Calc (NIH): 175 mg/dL — ABNORMAL HIGH (ref 0–99)
Triglycerides: 129 mg/dL (ref 0–149)
VLDL Cholesterol Cal: 23 mg/dL (ref 5–40)

## 2023-05-08 LAB — HEMOGLOBIN A1C
Est. average glucose Bld gHb Est-mCnc: 134 mg/dL
Hgb A1c MFr Bld: 6.3 % — ABNORMAL HIGH (ref 4.8–5.6)

## 2023-05-08 LAB — VITAMIN D 25 HYDROXY (VIT D DEFICIENCY, FRACTURES): Vit D, 25-Hydroxy: 37.1 ng/mL (ref 30.0–100.0)

## 2023-05-08 LAB — TSH: TSH: 4.9 u[IU]/mL — ABNORMAL HIGH (ref 0.450–4.500)

## 2023-05-15 DIAGNOSIS — E559 Vitamin D deficiency, unspecified: Secondary | ICD-10-CM | POA: Diagnosis not present

## 2023-05-15 DIAGNOSIS — R2681 Unsteadiness on feet: Secondary | ICD-10-CM | POA: Diagnosis not present

## 2023-05-15 DIAGNOSIS — E785 Hyperlipidemia, unspecified: Secondary | ICD-10-CM | POA: Diagnosis not present

## 2023-05-15 DIAGNOSIS — E890 Postprocedural hypothyroidism: Secondary | ICD-10-CM | POA: Diagnosis not present

## 2023-05-15 DIAGNOSIS — E1169 Type 2 diabetes mellitus with other specified complication: Secondary | ICD-10-CM | POA: Diagnosis not present

## 2023-05-15 DIAGNOSIS — R2689 Other abnormalities of gait and mobility: Secondary | ICD-10-CM | POA: Diagnosis not present

## 2023-05-16 ENCOUNTER — Inpatient Hospital Stay
Admission: EM | Admit: 2023-05-16 | Discharge: 2023-05-29 | DRG: 871 | Disposition: E | Payer: Medicare Other | Attending: Internal Medicine | Admitting: Internal Medicine

## 2023-05-16 ENCOUNTER — Inpatient Hospital Stay: Payer: Medicare Other

## 2023-05-16 ENCOUNTER — Other Ambulatory Visit: Payer: Self-pay

## 2023-05-16 ENCOUNTER — Emergency Department: Payer: Medicare Other

## 2023-05-16 DIAGNOSIS — R0689 Other abnormalities of breathing: Secondary | ICD-10-CM | POA: Diagnosis not present

## 2023-05-16 DIAGNOSIS — R0989 Other specified symptoms and signs involving the circulatory and respiratory systems: Secondary | ICD-10-CM | POA: Diagnosis not present

## 2023-05-16 DIAGNOSIS — R188 Other ascites: Secondary | ICD-10-CM | POA: Diagnosis not present

## 2023-05-16 DIAGNOSIS — R8281 Pyuria: Secondary | ICD-10-CM

## 2023-05-16 DIAGNOSIS — Z85828 Personal history of other malignant neoplasm of skin: Secondary | ICD-10-CM | POA: Diagnosis not present

## 2023-05-16 DIAGNOSIS — R6521 Severe sepsis with septic shock: Secondary | ICD-10-CM | POA: Diagnosis present

## 2023-05-16 DIAGNOSIS — K8000 Calculus of gallbladder with acute cholecystitis without obstruction: Secondary | ICD-10-CM | POA: Diagnosis present

## 2023-05-16 DIAGNOSIS — E876 Hypokalemia: Secondary | ICD-10-CM | POA: Diagnosis present

## 2023-05-16 DIAGNOSIS — E1169 Type 2 diabetes mellitus with other specified complication: Secondary | ICD-10-CM | POA: Diagnosis present

## 2023-05-16 DIAGNOSIS — N390 Urinary tract infection, site not specified: Secondary | ICD-10-CM | POA: Diagnosis not present

## 2023-05-16 DIAGNOSIS — E7800 Pure hypercholesterolemia, unspecified: Secondary | ICD-10-CM | POA: Diagnosis present

## 2023-05-16 DIAGNOSIS — R918 Other nonspecific abnormal finding of lung field: Secondary | ICD-10-CM | POA: Diagnosis not present

## 2023-05-16 DIAGNOSIS — K8020 Calculus of gallbladder without cholecystitis without obstruction: Secondary | ICD-10-CM | POA: Diagnosis not present

## 2023-05-16 DIAGNOSIS — R410 Disorientation, unspecified: Secondary | ICD-10-CM | POA: Diagnosis not present

## 2023-05-16 DIAGNOSIS — E890 Postprocedural hypothyroidism: Secondary | ICD-10-CM | POA: Diagnosis present

## 2023-05-16 DIAGNOSIS — Z888 Allergy status to other drugs, medicaments and biological substances status: Secondary | ICD-10-CM

## 2023-05-16 DIAGNOSIS — J9602 Acute respiratory failure with hypercapnia: Secondary | ICD-10-CM | POA: Diagnosis not present

## 2023-05-16 DIAGNOSIS — J9601 Acute respiratory failure with hypoxia: Secondary | ICD-10-CM | POA: Diagnosis present

## 2023-05-16 DIAGNOSIS — I4891 Unspecified atrial fibrillation: Secondary | ICD-10-CM | POA: Diagnosis not present

## 2023-05-16 DIAGNOSIS — Z91018 Allergy to other foods: Secondary | ICD-10-CM

## 2023-05-16 DIAGNOSIS — N179 Acute kidney failure, unspecified: Secondary | ICD-10-CM | POA: Diagnosis present

## 2023-05-16 DIAGNOSIS — R54 Age-related physical debility: Secondary | ICD-10-CM | POA: Diagnosis present

## 2023-05-16 DIAGNOSIS — Z880 Allergy status to penicillin: Secondary | ICD-10-CM | POA: Diagnosis not present

## 2023-05-16 DIAGNOSIS — K8309 Other cholangitis: Secondary | ICD-10-CM | POA: Diagnosis present

## 2023-05-16 DIAGNOSIS — Z515 Encounter for palliative care: Secondary | ICD-10-CM

## 2023-05-16 DIAGNOSIS — M1990 Unspecified osteoarthritis, unspecified site: Secondary | ICD-10-CM | POA: Diagnosis present

## 2023-05-16 DIAGNOSIS — Z66 Do not resuscitate: Secondary | ICD-10-CM | POA: Diagnosis present

## 2023-05-16 DIAGNOSIS — Z79890 Hormone replacement therapy (postmenopausal): Secondary | ICD-10-CM | POA: Diagnosis not present

## 2023-05-16 DIAGNOSIS — R000 Tachycardia, unspecified: Secondary | ICD-10-CM | POA: Diagnosis not present

## 2023-05-16 DIAGNOSIS — R0602 Shortness of breath: Secondary | ICD-10-CM | POA: Diagnosis not present

## 2023-05-16 DIAGNOSIS — Z833 Family history of diabetes mellitus: Secondary | ICD-10-CM | POA: Diagnosis not present

## 2023-05-16 DIAGNOSIS — K838 Other specified diseases of biliary tract: Secondary | ICD-10-CM | POA: Diagnosis not present

## 2023-05-16 DIAGNOSIS — R6520 Severe sepsis without septic shock: Secondary | ICD-10-CM | POA: Diagnosis not present

## 2023-05-16 DIAGNOSIS — A419 Sepsis, unspecified organism: Principal | ICD-10-CM | POA: Diagnosis present

## 2023-05-16 DIAGNOSIS — E872 Acidosis, unspecified: Secondary | ICD-10-CM | POA: Diagnosis present

## 2023-05-16 DIAGNOSIS — K8070 Calculus of gallbladder and bile duct without cholecystitis without obstruction: Secondary | ICD-10-CM | POA: Diagnosis not present

## 2023-05-16 DIAGNOSIS — R17 Unspecified jaundice: Secondary | ICD-10-CM

## 2023-05-16 DIAGNOSIS — Z8249 Family history of ischemic heart disease and other diseases of the circulatory system: Secondary | ICD-10-CM

## 2023-05-16 DIAGNOSIS — I872 Venous insufficiency (chronic) (peripheral): Secondary | ICD-10-CM | POA: Diagnosis present

## 2023-05-16 DIAGNOSIS — R9389 Abnormal findings on diagnostic imaging of other specified body structures: Secondary | ICD-10-CM | POA: Diagnosis not present

## 2023-05-16 DIAGNOSIS — R5383 Other fatigue: Secondary | ICD-10-CM | POA: Diagnosis present

## 2023-05-16 DIAGNOSIS — J90 Pleural effusion, not elsewhere classified: Secondary | ICD-10-CM | POA: Diagnosis not present

## 2023-05-16 DIAGNOSIS — Z1152 Encounter for screening for COVID-19: Secondary | ICD-10-CM | POA: Diagnosis not present

## 2023-05-16 DIAGNOSIS — I959 Hypotension, unspecified: Secondary | ICD-10-CM | POA: Diagnosis not present

## 2023-05-16 DIAGNOSIS — R140 Abdominal distension (gaseous): Secondary | ICD-10-CM | POA: Diagnosis not present

## 2023-05-16 LAB — COMPREHENSIVE METABOLIC PANEL
ALT: 13 U/L (ref 0–44)
AST: 31 U/L (ref 15–41)
Albumin: 4.1 g/dL (ref 3.5–5.0)
Alkaline Phosphatase: 59 U/L (ref 38–126)
Anion gap: 20 — ABNORMAL HIGH (ref 5–15)
BUN: 33 mg/dL — ABNORMAL HIGH (ref 8–23)
CO2: 19 mmol/L — ABNORMAL LOW (ref 22–32)
Calcium: 9.2 mg/dL (ref 8.9–10.3)
Chloride: 96 mmol/L — ABNORMAL LOW (ref 98–111)
Creatinine, Ser: 1.39 mg/dL — ABNORMAL HIGH (ref 0.44–1.00)
GFR, Estimated: 36 mL/min — ABNORMAL LOW (ref >60)
Glucose, Bld: 211 mg/dL — ABNORMAL HIGH (ref 70–99)
Potassium: 3.3 mmol/L — ABNORMAL LOW (ref 3.5–5.1)
Sodium: 135 mmol/L (ref 135–145)
Total Bilirubin: 2.6 mg/dL — ABNORMAL HIGH (ref 0.3–1.2)
Total Protein: 7.8 g/dL (ref 6.5–8.1)

## 2023-05-16 LAB — BLOOD GAS, VENOUS
Acid-Base Excess: 0.3 mmol/L (ref 0.0–2.0)
Bicarbonate: 23.5 mmol/L (ref 20.0–28.0)
O2 Saturation: 84.9 %
Patient temperature: 37
pCO2, Ven: 33 mm[Hg] — ABNORMAL LOW (ref 44–60)
pH, Ven: 7.46 — ABNORMAL HIGH (ref 7.25–7.43)
pO2, Ven: 52 mm[Hg] — ABNORMAL HIGH (ref 32–45)

## 2023-05-16 LAB — CBC WITH DIFFERENTIAL/PLATELET
Abs Immature Granulocytes: 0 10*3/uL (ref 0.00–0.07)
Basophils Absolute: 0 10*3/uL (ref 0.0–0.1)
Basophils Relative: 0 %
Eosinophils Absolute: 0.2 10*3/uL (ref 0.0–0.5)
Eosinophils Relative: 3 %
HCT: 54.3 % — ABNORMAL HIGH (ref 36.0–46.0)
Hemoglobin: 17.7 g/dL — ABNORMAL HIGH (ref 12.0–15.0)
Immature Granulocytes: 0 %
Lymphocytes Relative: 6 %
Lymphs Abs: 0.4 10*3/uL — ABNORMAL LOW (ref 0.7–4.0)
MCH: 30.4 pg (ref 26.0–34.0)
MCHC: 32.6 g/dL (ref 30.0–36.0)
MCV: 93.1 fL (ref 80.0–100.0)
Monocytes Absolute: 0.5 10*3/uL (ref 0.1–1.0)
Monocytes Relative: 7 %
Neutro Abs: 5.1 10*3/uL (ref 1.7–7.7)
Neutrophils Relative %: 84 %
Platelets: 275 10*3/uL (ref 150–400)
RBC: 5.83 MIL/uL — ABNORMAL HIGH (ref 3.87–5.11)
RDW: 13.4 % (ref 11.5–15.5)
Smear Review: NORMAL
WBC: 6.1 10*3/uL (ref 4.0–10.5)
nRBC: 0 % (ref 0.0–0.2)

## 2023-05-16 LAB — RESP PANEL BY RT-PCR (RSV, FLU A&B, COVID)  RVPGX2
Influenza A by PCR: NEGATIVE
Influenza B by PCR: NEGATIVE
Resp Syncytial Virus by PCR: NEGATIVE
SARS Coronavirus 2 by RT PCR: NEGATIVE

## 2023-05-16 LAB — URINALYSIS, W/ REFLEX TO CULTURE (INFECTION SUSPECTED)
Bacteria, UA: NONE SEEN
Bilirubin Urine: NEGATIVE
Glucose, UA: NEGATIVE mg/dL
Hgb urine dipstick: NEGATIVE
Ketones, ur: NEGATIVE mg/dL
Nitrite: NEGATIVE
Protein, ur: 100 mg/dL — AB
Specific Gravity, Urine: 1.023 (ref 1.005–1.030)
pH: 5 (ref 5.0–8.0)

## 2023-05-16 LAB — PROCALCITONIN: Procalcitonin: 52.96 ng/mL

## 2023-05-16 LAB — AMMONIA: Ammonia: 15 umol/L (ref 9–35)

## 2023-05-16 LAB — LACTIC ACID, PLASMA
Lactic Acid, Venous: 8.1 mmol/L (ref 0.5–1.9)
Lactic Acid, Venous: 7 mmol/L (ref 0.5–1.9)
Lactic Acid, Venous: 3.9 mmol/L (ref 0.5–1.9)

## 2023-05-16 LAB — CBG MONITORING, ED
Glucose-Capillary: 162 mg/dL — ABNORMAL HIGH (ref 70–99)
Glucose-Capillary: 124 mg/dL — ABNORMAL HIGH (ref 70–99)

## 2023-05-16 LAB — MAGNESIUM: Magnesium: 1.9 mg/dL (ref 1.7–2.4)

## 2023-05-16 MED ORDER — ACETAMINOPHEN 325 MG PO TABS: 650.0000 mg | ORAL_TABLET | Freq: Four times a day (QID) | ORAL | Status: DC | PRN

## 2023-05-16 MED ORDER — VANCOMYCIN HCL IN DEXTROSE 1-5 GM/200ML-% IV SOLN
1000.0000 mg | Freq: Once | INTRAVENOUS | Status: AC
  Administered 2023-05-16: 1000 mg via INTRAVENOUS
  Filled 2023-05-16: qty 200

## 2023-05-16 MED ORDER — LEVOTHYROXINE SODIUM 50 MCG PO TABS
75.0000 ug | ORAL_TABLET | ORAL | Status: DC
  Filled 2023-05-16: qty 2

## 2023-05-16 MED ORDER — LACTATED RINGERS IV BOLUS
1000.0000 mL | Freq: Once | INTRAVENOUS | Status: AC
  Administered 2023-05-16: 1000 mL via INTRAVENOUS

## 2023-05-16 MED ORDER — METRONIDAZOLE 500 MG/100ML IV SOLN
500.0000 mg | Freq: Two times a day (BID) | INTRAVENOUS | Status: DC
  Administered 2023-05-16 – 2023-05-17 (×2): 500 mg via INTRAVENOUS
  Filled 2023-05-16 (×2): qty 100

## 2023-05-16 MED ORDER — ACETAMINOPHEN 325 MG RE SUPP: 650.0000 mg | Freq: Four times a day (QID) | RECTAL | Status: DC | PRN

## 2023-05-16 MED ORDER — SODIUM CHLORIDE 0.9 % IV SOLN
2.0000 g | Freq: Once | INTRAVENOUS | Status: AC
  Administered 2023-05-16: 2 g via INTRAVENOUS
  Filled 2023-05-16: qty 12.5

## 2023-05-16 MED ORDER — LACTATED RINGERS IV SOLN: INTRAVENOUS | Status: DC

## 2023-05-16 MED ORDER — SODIUM BICARBONATE 8.4 % IV SOLN
25.0000 meq | Freq: Once | INTRAVENOUS | Status: AC
  Administered 2023-05-16: 25 meq via INTRAVENOUS
  Filled 2023-05-16: qty 50

## 2023-05-16 MED ORDER — ONDANSETRON HCL 4 MG PO TABS: 4.0000 mg | ORAL_TABLET | Freq: Four times a day (QID) | ORAL | Status: DC | PRN

## 2023-05-16 MED ORDER — SENNOSIDES-DOCUSATE SODIUM 8.6-50 MG PO TABS: 1.0000 | ORAL_TABLET | Freq: Every evening | ORAL | Status: DC | PRN

## 2023-05-16 MED ORDER — SODIUM CHLORIDE 0.9 % IV SOLN
500.0000 mg | INTRAVENOUS | Status: DC
  Administered 2023-05-16: 500 mg via INTRAVENOUS
  Filled 2023-05-16: qty 5

## 2023-05-16 MED ORDER — INSULIN ASPART 100 UNIT/ML IJ SOLN
0.0000 [IU] | Freq: Three times a day (TID) | INTRAMUSCULAR | Status: DC
  Administered 2023-05-16: 2 [IU] via SUBCUTANEOUS
  Filled 2023-05-16: qty 1

## 2023-05-16 MED ORDER — LEVOTHYROXINE SODIUM 88 MCG PO TABS
88.0000 ug | ORAL_TABLET | ORAL | Status: DC
  Administered 2023-05-16: 88 ug via ORAL
  Filled 2023-05-16: qty 1

## 2023-05-16 MED ORDER — INSULIN ASPART 100 UNIT/ML IJ SOLN: 0.0000 [IU] | Freq: Every day | INTRAMUSCULAR | Status: DC

## 2023-05-16 MED ORDER — SODIUM BICARBONATE 650 MG PO TABS: 650.0000 mg | ORAL_TABLET | Freq: Two times a day (BID) | ORAL | Status: DC

## 2023-05-16 MED ORDER — SODIUM BICARBONATE 650 MG PO TABS
650.0000 mg | ORAL_TABLET | Freq: Two times a day (BID) | ORAL | Status: DC
  Administered 2023-05-16: 650 mg via ORAL
  Filled 2023-05-16: qty 1

## 2023-05-16 MED ORDER — SODIUM CHLORIDE 0.9 % IV SOLN: 2.0000 g | INTRAVENOUS | Status: DC

## 2023-05-16 MED ORDER — ONDANSETRON HCL 4 MG/2ML IJ SOLN: 4.0000 mg | Freq: Four times a day (QID) | INTRAMUSCULAR | Status: DC | PRN

## 2023-05-16 MED ORDER — HEPARIN SODIUM (PORCINE) 5000 UNIT/ML IJ SOLN
5000.0000 [IU] | Freq: Three times a day (TID) | INTRAMUSCULAR | Status: DC
  Administered 2023-05-16 – 2023-05-17 (×2): 5000 [IU] via SUBCUTANEOUS
  Filled 2023-05-16 (×2): qty 1

## 2023-05-16 NOTE — ED Notes (Addendum)
Pt stated that she is hot multiple times this evening.  Pt sweating, afebrile and no distress.  Per MD Mansy possibly S/E of vancomycin. No further orders given.  Pt given cold wet washcloths and battery powered fan for comfort.  Family at bedside.

## 2023-05-16 NOTE — Assessment & Plan Note (Addendum)
Now comfort care 

## 2023-05-16 NOTE — ED Notes (Signed)
Derrill Kay, MD, made aware of lactic 8.1

## 2023-05-16 NOTE — ED Provider Notes (Signed)
[facility] Provider Note    Event Date/Time   First MD Initiated Contact with Patient 04/30/2023 0930     (approximate)   History   Confusion   HPI  Shannon Mckinney is a 89 y.o. female   who presents to the emergency department today with concerns for confusion as well as some shortness of breath.  Some history is obtained by family at baseline.  They state that they noticed some confusion last night.  When they went to check on her this morning she was slightly more confused and now complaining of some chest pain and shortness of breath.  However at the time my exam the patient does not necessarily recall having any chest pain or shortness of breath.  She does state that she has been urinating more than normal although denies any painful urination or bad odor to her urine.      Physical Exam   Triage Vital Signs: ED Triage Vitals [04/29/2023 0943]  Encounter Vitals Group     BP 109/67     Systolic BP Percentile      Diastolic BP Percentile      Pulse Rate (!) 110     Resp (!) 40     Temp 97.6 F (36.4 C)     Temp Source Axillary     SpO2 95 %     Weight      Height      Head Circumference      Peak Flow      Pain Score      Pain Loc      Pain Education      Exclude from Growth Chart     Most recent vital signs: Vitals:   05/07/2023 0943  BP: 109/67  Pulse: (!) 110  Resp: (!) 40  Temp: 97.6 F (36.4 C)  SpO2: 95%   General: Awake, alert, oriented. CV:  Good peripheral perfusion. Tachycardia. Resp:  Normal effort. Lungs clear. Abd:  No distention.    ED Results / Procedures / Treatments   Labs (all labs ordered are listed, but only abnormal results are displayed) Labs Reviewed - No data to display   EKG  I, Phineas Semen, attending physician, personally viewed and interpreted this EKG  EKG Time: 1007 Rate: 113 Rhythm: sinus tachycardia Axis: left axis deviation Intervals: qtc 472 QRS: narrow, q waves III ST changes: no st  elevation Impression: abnormal ekg   RADIOLOGY I independently interpreted and visualized the CXR. My interpretation: *** Radiology interpretation: ***    PROCEDURES:  Critical Care performed: Yes  CRITICAL CARE Performed by: Phineas Semen   Total critical care time: *** minutes  Critical care time was exclusive of separately billable procedures and treating other patients.  Critical care was necessary to treat or prevent imminent or life-threatening deterioration.  Critical care was time spent personally by me on the following activities: development of treatment plan with patient and/or surrogate as well as nursing, discussions with consultants, evaluation of patient's response to treatment, examination of patient, obtaining history from patient or surrogate, ordering and performing treatments and interventions, ordering and review of laboratory studies, ordering and review of radiographic studies, pulse oximetry and re-evaluation of patient's condition.   Procedures    MEDICATIONS ORDERED IN ED: Medications - No data to display   IMPRESSION / MDM / ASSESSMENT AND PLAN / ED COURSE  I reviewed the triage vital signs and the nursing notes.  Differential diagnosis includes, but is not limited to, covid, pna, UTI  Patient's presentation is most consistent with acute presentation with potential threat to life or bodily function.   The patient is on the cardiac monitor to evaluate for evidence of arrhythmia and/or significant heart rate changes.  Patient presented to the emergency department today with concerns for confusion, shortness of breath and possibly some chest pain.  On exam patient cannot give great history.  Was found to be slightly tachypneic and tachycardic on vital signs.  Patient was afebrile.  However I do have concerns for possible infection.  Will check blood work a UA and chest x-ray.  ***      FINAL CLINICAL  IMPRESSION(S) / ED DIAGNOSES   Final diagnoses:  None     Rx / DC Orders   ED Discharge Orders     None        Note:  This document was prepared using Dragon voice recognition software and may include unintentional dictation errors.

## 2023-05-16 NOTE — Hospital Course (Signed)
[name] is a 89 year old female with history of hypothyroid, who presents emergency department for chief concerns of altered mental status, with congestion.  Vitals in the ED showed temperature of 97.6, respiration rate of 40, heart rate of 110, blood pressure 109/67, and per ED nursing documentation, initial SpO2 was 88% on room air, patient was placed on 2 L nasal cannula with improvement to 91%.  Serum sodium is 135, potassium 3.3, chloride 96, bicarb 19, BUN of 33, serum creatinine 1.39, EGFR 36, nonfasting blood glucose 211, WBC 6.1, hemoglobin 17.7, platelets of 275.  COVID/influenza A/influenza B/RSV PCR were negative.  Lactic acid was initially 8.1 and on repeat was 7.0.  UA was positive for small leukocytes.  Chest x-ray: Was read as no active disease.  ED treatment: Vancomycin, cefepime per pharmacy, LR 1 L bolus x 2.

## 2023-05-16 NOTE — ED Notes (Signed)
Pt assisted to in room toilet. Ambulatory with 1 assist.

## 2023-05-16 NOTE — ED Notes (Signed)
Pt states that she is no longer hot.

## 2023-05-16 NOTE — ED Notes (Signed)
Pt O2 at 88%, RR of 40, placed on 2L nasal cannula. 91% at this time.

## 2023-05-16 NOTE — ED Notes (Signed)
Pt unable to urinate.  Family at bedside.

## 2023-05-16 NOTE — Assessment & Plan Note (Addendum)
No further labs. Now comfort care.

## 2023-05-16 NOTE — ED Triage Notes (Signed)
Pt to ED via [facility] from home for c/0 SOB. Pt reports "breathing heavy through the night." RR 30-40, 94% on room air. Initial BP 70/40, after fluids 80/60. Pt reports urinating every two hours and states it has a "funny smell." Pt allergic to penicillin. Pt alert and oriented at baseline, currently confused and alert to self.  HR 100-130 (no hx of Afib) Cbg 230 (no hx of diabetes)

## 2023-05-16 NOTE — Assessment & Plan Note (Signed)
Fall precautions.  

## 2023-05-16 NOTE — Assessment & Plan Note (Addendum)
Supplement O2 if needed for pt comfort, if family desire.

## 2023-05-16 NOTE — H&P (Addendum)
History and Physical   Shannon Mckinney ZOX:[id number] DOB: February 04, 1929 DOA: 05/12/2023  PCP: Erasmo Downer, MD  Patient coming from: home via EMS  I have personally briefly reviewed patient's old medical records in [facility] EMR.  Chief Concern: Confused  HPI: Shannon Mckinney is a 89 year old female with history of hypothyroid, who presents emergency department for chief concerns of altered mental status, with congestion.  Vitals in the ED showed temperature of 97.6, respiration rate of 40, heart rate of 110, blood pressure 109/67, and per ED nursing documentation, initial SpO2 was 88% on room air, patient was placed on 2 L nasal cannula with improvement to 91%.  Serum sodium is 135, potassium 3.3, chloride 96, bicarb 19, BUN of 33, serum creatinine 1.39, EGFR 36, nonfasting blood glucose 211, WBC 6.1, hemoglobin 17.7, platelets of 275.  COVID/influenza A/influenza B/RSV PCR were negative.  Lactic acid was initially 8.1 and on repeat was 7.0.  UA was positive for small leukocytes.  Chest x-ray: Was read as no active disease.  ED treatment: Vancomycin, cefepime per pharmacy, LR 1 L bolus x 2. ---------------------------------- At bedside, she is able to tell me her name, age, current location.  She reports she has been urinating more than normal over the last 1 month. She reports this has resolved since given medication in the emergency department.  She denies chest pain, shortness of breath, dysuria, passing out.  At bedside, ultrasound tech was in the process of completing her right upper quadrant abdominal ultrasound, patient endorses tenderness in that area.  On physical exam, patient also had suprapubic tenderness.  She denies trauma to her person.  Social history: She lives at home on her own.  She denies tobacco, EtOH, recreational drug use.  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  ROS: Constitutional: no weight change, no fever ENT/Mouth: no sore  throat, no rhinorrhea Eyes: no eye pain, no vision changes Cardiovascular: no chest pain, no dyspnea,  no edema, no palpitations Respiratory: no cough, no sputum, no wheezing Gastrointestinal: no nausea, no vomiting, no diarrhea, no constipation Genitourinary: no urinary incontinence, no dysuria, no hematuria, Musculoskeletal: no arthralgias, no myalgias Skin: no skin lesions, no pruritus, Neuro: + weakness, no loss of consciousness, no syncope Psych: no anxiety, no depression, + decrease appetite Heme/Lymph: no bruising, no bleeding  ED Course: Discussed with EDP, patient requiring hospitlaizaiton for possible sepsis.  Assessment/Plan  Principal Problem:   Severe sepsis with acute organ dysfunction [facility]) Active Problems:   Total bilirubin, elevated   AKI (acute kidney injury) (HCC)   Acute hypoxic respiratory failure (HCC)   Pyuria   Hypokalemia   Hyperlipidemia associated with type 2 diabetes mellitus (HCC)   Fatigue   Postablative hypothyroidism   Assessment and Plan:  * Severe sepsis with acute organ dysfunction (HCC) Etiology workup in progress, blood cultures x 2 and urine culture are in process Given elevated T. bili, and markedly elevated lactic acid, with small amount of leukocytosis in the UA, we will order right upper quadrant ultrasound to evaluate hepatobiliary system Ceftriaxone 2 g IV, 7-day course, metronidazole 5 500 mg IV twice daily, 7-day course ordered for intra-abdominal infection Recheck lactic acid timed for 1530 on admission CT imaging was considered. In setting of AKI, AM to order CT chest, abdomen with contrast if indicated tomorrow, pending improved renal function Third lactic acid resulted and is 3.9, improved from 7.0 Admit to stepdown, inpatient  Acute hypoxic respiratory failure [facility]) Per ED documentation,  patient had SpO2 of 88% on room air, patient was placed on 2 L nasal cannula with improvement Increased respiration rate Check  VBG  Addendum: Procalcitonin is markedly elevated at 52.96, added this azithromycin 500 mg IV daily Recheck procalcitonin in the a.m. Check urine antigen for Legionella.  AKI (acute kidney injury) (HCC) Bicarb 650 mg p.o., 2 doses ordered, first dose now (1830) Suspect secondary to severe sepsis Status post LR 2 L bolus ordered on admission Recheck BMP in a.m.  Total bilirubin, elevated With elevated lactic acid Right upper quadrant ultrasound ordered on admission Ceftriaxone 2 g IV daily, metronidazole 500 mg IV daily, 7-day course ordered Recheck hepatic function in the a.m.  Hypokalemia Suspect secondary to acute kidney injury Will check magnesium level on admission and in the a.m. Potassium chloride 10 mEq IV, 3 doses ordered  Pyuria Present on admission Continue with ceftriaxone 2 g IV daily Urine culture is in process  Fatigue Fall precautions  Postablative hypothyroidism Patient takes levothyroxine 88 mcg and 75 mcg, alternating between these medications every other day Home medications resumed Resumed levothyroxine 88 mcg today, and levothyroxine 75 mcg on 06-08-23. Continue outpatient follow-up with PCP as appropriate  Chart reviewed.   DVT prophylaxis: Heparin 5000 units subcutaneous every 8 hours Code Status: Full code Diet: Regular diet Family Communication: Updated Peyton Najjar, patient's son and Rosey Bath, daughter-in-law with patient's permission Disposition Plan: Pending clinical course Consults called: None at this time Admission status: Telemetry cardiac, inpatient  Past Medical History:  Diagnosis Date   Allergy    Arthritis    Benign head tremor    Diabetes mellitus (HCC)    diet controlled   Fibrocystic breast disease    Heart murmur    History of abnormal Pap smear    Hx of basal cell carcinoma 11/06/2006   R nasolabial, excised 11/24/2006   Hx of basal cell carcinoma 04/10/2009   Mid nasal dorsum   Hx of basal cell carcinoma 01/26/2017   Top of L  shoulder   Hypercholesterolemia    Hyperthyroidism    s/p ablation   Squamous cell carcinoma of skin 01/21/2016   R temple above lat brow   Squamous cell carcinoma of skin 06/23/2016   L upper lip, excised 08/04/2016   Past Surgical History:  Procedure Laterality Date   BREAST BIOPSY  70's   benign   CARPAL TUNNEL RELEASE     right hand   CATARACT EXTRACTION W/PHACO Right 05/09/2020   Procedure: CATARACT EXTRACTION PHACO AND INTRAOCULAR LENS PLACEMENT (IOC) RIGHT DIABETIC 10.17  01:29.9  11.3%;  Surgeon: Lockie Mola, MD;  Location: [facility];  Service: Ophthalmology;  Laterality: Right;   CATARACT EXTRACTION W/PHACO Left 05/30/2020   Procedure: CATARACT EXTRACTION PHACO AND INTRAOCULAR LENS PLACEMENT (IOC) LEFT DIABETIC;  Surgeon: Lockie Mola, MD;  Location: [facility];  Service: Ophthalmology;  Laterality: Left;  12.20 1:24.7 14.4%   KNEE ARTHROSCOPY Left 09/12/2015   Procedure: ARTHROSCOPY LEFT KNEE, PARTIAL MEDIAL MENISECTOMY, PARTIAL LATERAL MENISECTOMY, CHONDROPLASTY MEDIAL AND LATERAL;  Surgeon: Donato Heinz, MD;  Location: [facility];  Service: Orthopedics;  Laterality: Left;   SHOULDER ARTHROSCOPY     TONSILLECTOMY     Social History:  reports that she has never smoked. She has never used smokeless tobacco. She reports current alcohol use. She reports that she does not use drugs.  Allergies  Allergen Reactions   Mysoline [Primidone] Other (See Comments)    "unknown"   Penicillins Rash   Red Dye  Rash   Family History  Problem Relation Age of Onset   Heart disease Father        myocardial infarction - died 27   Diabetes Father    Heart disease Mother        myocardial infarction   Heart disease Brother        x2   Diabetes Brother    Diabetes Brother    Diabetes Sister    Breast cancer Neg Hx    Colon cancer Neg Hx    Family history: Family history reviewed and not pertinent.  Prior to Admission medications   Medication Sig  Start Date End Date Taking? Authorizing Provider  Cholecalciferol (VITAMIN D-3 PO) Take by mouth.    [provider]  levothyroxine (SYNTHROID) 75 MCG tablet TAKE 1 TABLET BY MOUTH EVERY OTHER DAY. 11/11/22   Erasmo Downer, MD  levothyroxine (SYNTHROID) 88 MCG tablet TAKE 1 TABET BY MOUTH EVERY OTHER DAY. NEED APPOINTMENT FOR REFILLS. 01/16/23   Erasmo Downer, MD   Physical Exam: Vitals:   05/23/2023 1230 05/13/2023 1300 05/24/2023 1330 05/09/2023 1730  BP: (!) 107/93 135/68 114/70 107/60  Pulse: (!) 103 (!) 106 (!) 109 (!) 103  Resp: 20 (!) 42 (!) 30 (!) 39  Temp:    97.7 F (36.5 C)  TempSrc:    Oral  SpO2: 96% 96% 99% 94%   Constitutional: appears frail, acutely ill, weak Eyes: PERRL, lids and conjunctivae normal ENMT: Mucous membranes are dry. Posterior pharynx clear of any exudate or lesions. Age-appropriate dentition. Hearing appropriate Neck: normal, supple, no masses, no thyromegaly Respiratory: clear to auscultation bilaterally, no wheezing, no crackles. Normal respiratory effort. No accessory muscle use.  Cardiovascular: Regular rate and rhythm, no murmurs / rubs / gallops. No extremity edema. 2+ pedal pulses. No carotid bruits.  Abdomen: Suprapubic tenderness and right upper quadrant tenderness with palpation, no masses palpated, no hepatosplenomegaly. Bowel sounds positive.  Musculoskeletal: no clubbing / cyanosis. No joint deformity upper and lower extremities. Good ROM, no contractures, no atrophy. Normal muscle tone.  Skin: no rashes, lesions, ulcers. No induration Neurologic: Sensation intact. Strength 5/5 in all 4.  Psychiatric: Normal judgment and insight. Alert and oriented x 3. Normal mood.   EKG: independently reviewed, showing sinus tachycardia with rate of 113, QTc 472  Chest x-ray on Admission: I personally reviewed and I agree with radiologist reading as below.  DG Chest Port 1 View  Result Date: 05/12/2023 CLINICAL DATA:  Shortness of breath.   Concern for sepsis. EXAM: PORTABLE CHEST 1 VIEW COMPARISON:  10/19/2020. FINDINGS: Low lung volume. Elevated right hemidiaphragm again seen. Bilateral lung fields are clear. Bilateral costophrenic angles are clear. Normal cardio-mediastinal silhouette. No acute osseous abnormalities. The soft tissues are within normal limits. IMPRESSION: No active disease. Electronically Signed   By: Jules Schick M.D.   On: 04/28/2023 10:56    Labs on Admission: I have personally reviewed following labs  CBC: Recent Labs  Lab 05/23/2023 1053  WBC 6.1  NEUTROABS 5.1  HGB 17.7*  HCT 54.3*  MCV 93.1  PLT 275   Basic Metabolic Panel: Recent Labs  Lab 05/15/2023 1053 05/01/2023 1727  NA 135  --   K 3.3*  --   CL 96*  --   CO2 19*  --   GLUCOSE 211*  --   BUN 33*  --   CREATININE 1.39*  --   CALCIUM 9.2  --   MG  --  1.9  GFR: Estimated Creatinine Clearance: 19.5 mL/min (A) (by C-G formula based on SCr of 1.39 mg/dL (H)).  Liver Function Tests: Recent Labs  Lab 2023-05-30 1053  AST 31  ALT 13  ALKPHOS 59  BILITOT 2.6*  PROT 7.8  ALBUMIN 4.1   Urine analysis:    Component Value Date/Time   COLORURINE AMBER (A) May 30, 2023 1257   APPEARANCEUR CLOUDY (A) 05-30-2023 1257   LABSPEC 1.023 05/30/23 1257   PHURINE 5.0 May 30, 2023 1257   GLUCOSEU NEGATIVE May 30, 2023 1257   HGBUR NEGATIVE 05-30-2023 1257   BILIRUBINUR NEGATIVE 2023-05-30 1257   BILIRUBINUR Negative 04/16/2021 0837   KETONESUR NEGATIVE May 30, 2023 1257   PROTEINUR 100 (A) 05/30/2023 1257   UROBILINOGEN 0.2 04/16/2021 0837   NITRITE NEGATIVE 05/30/23 1257   LEUKOCYTESUR SMALL (A) May 30, 2023 1257   CRITICAL CARE Performed by: Dr. Sedalia Muta  Total critical care time: 40 minutes  Critical care time was exclusive of separately billable procedures and treating other patients.  Critical care was necessary to treat or prevent imminent or life-threatening deterioration.  Critical care was time spent personally by me on the following  activities: development of treatment plan with patient and/or surrogate as well as nursing, discussions with consultants, evaluation of patient's response to treatment, examination of patient, obtaining history from patient or surrogate, ordering and performing treatments and interventions, ordering and review of laboratory studies, ordering and review of radiographic studies, pulse oximetry and re-evaluation of patient's condition.  This document was prepared using Dragon Voice Recognition software and may include unintentional dictation errors.  Dr. Sedalia Muta [facility]  If 7PM-7AM, please contact overnight-coverage provider If 7AM-7PM, please contact day attending provider [email or web address]  05/30/23, 6:49 PM

## 2023-05-16 NOTE — Assessment & Plan Note (Addendum)
With elevated lactic acid Right upper quadrant ultrasound ordered on admission Ceftriaxone 2 g IV daily, metronidazole 500 mg IV daily, 7-day course ordered Recheck hepatic function in the a.m.

## 2023-05-17 ENCOUNTER — Inpatient Hospital Stay: Payer: Medicare Other

## 2023-05-17 DIAGNOSIS — Z515 Encounter for palliative care: Secondary | ICD-10-CM

## 2023-05-17 DIAGNOSIS — A419 Sepsis, unspecified organism: Secondary | ICD-10-CM

## 2023-05-17 DIAGNOSIS — R6520 Severe sepsis without septic shock: Secondary | ICD-10-CM | POA: Diagnosis not present

## 2023-05-17 DIAGNOSIS — K8309 Other cholangitis: Secondary | ICD-10-CM | POA: Diagnosis present

## 2023-05-17 LAB — CBC
HCT: 45 % (ref 36.0–46.0)
Hemoglobin: 15 g/dL (ref 12.0–15.0)
MCH: 30.2 pg (ref 26.0–34.0)
MCHC: 33.3 g/dL (ref 30.0–36.0)
MCV: 90.7 fL (ref 80.0–100.0)
Platelets: 247 10*3/uL (ref 150–400)
RBC: 4.96 MIL/uL (ref 3.87–5.11)
RDW: 13.8 % (ref 11.5–15.5)
WBC: 16.4 10*3/uL — ABNORMAL HIGH (ref 4.0–10.5)
nRBC: 0 % (ref 0.0–0.2)

## 2023-05-17 LAB — HEPATIC FUNCTION PANEL
ALT: 12 U/L (ref 0–44)
AST: 28 U/L (ref 15–41)
Albumin: 3 g/dL — ABNORMAL LOW (ref 3.5–5.0)
Alkaline Phosphatase: 52 U/L (ref 38–126)
Bilirubin, Direct: 0.8 mg/dL — ABNORMAL HIGH (ref 0.0–0.2)
Indirect Bilirubin: 1.6 mg/dL — ABNORMAL HIGH (ref 0.3–0.9)
Total Bilirubin: 2.4 mg/dL — ABNORMAL HIGH (ref 0.3–1.2)
Total Protein: 6 g/dL — ABNORMAL LOW (ref 6.5–8.1)

## 2023-05-17 LAB — BASIC METABOLIC PANEL
Anion gap: 18 — ABNORMAL HIGH (ref 5–15)
BUN: 54 mg/dL — ABNORMAL HIGH (ref 8–23)
CO2: 21 mmol/L — ABNORMAL LOW (ref 22–32)
Calcium: 8.3 mg/dL — ABNORMAL LOW (ref 8.9–10.3)
Chloride: 97 mmol/L — ABNORMAL LOW (ref 98–111)
Creatinine, Ser: 2.1 mg/dL — ABNORMAL HIGH (ref 0.44–1.00)
GFR, Estimated: 22 mL/min — ABNORMAL LOW (ref >60)
Glucose, Bld: 122 mg/dL — ABNORMAL HIGH (ref 70–99)
Potassium: 3.7 mmol/L (ref 3.5–5.1)
Sodium: 136 mmol/L (ref 135–145)

## 2023-05-17 LAB — GASTROINTESTINAL PANEL BY PCR, STOOL (REPLACES STOOL CULTURE)

## 2023-05-17 LAB — MAGNESIUM: Magnesium: 2 mg/dL (ref 1.7–2.4)

## 2023-05-17 LAB — LACTIC ACID, PLASMA
Lactic Acid, Venous: 4.5 mmol/L (ref 0.5–1.9)
Lactic Acid, Venous: >9 mmol/L (ref 0.5–1.9)

## 2023-05-17 LAB — C DIFFICILE QUICK SCREEN W PCR REFLEX
C Diff antigen: NEGATIVE
C Diff interpretation: NOT DETECTED
C Diff toxin: NEGATIVE

## 2023-05-17 LAB — PROCALCITONIN: Procalcitonin: 119.06 ng/mL

## 2023-05-17 LAB — CBG MONITORING, ED: Glucose-Capillary: 105 mg/dL — ABNORMAL HIGH (ref 70–99)

## 2023-05-17 MED ORDER — NOREPINEPHRINE 4 MG/250ML-% IV SOLN
2.0000 ug/min | INTRAVENOUS | Status: DC
  Administered 2023-05-17: 2 ug/min via INTRAVENOUS
  Filled 2023-05-17: qty 250

## 2023-05-17 MED ORDER — HALOPERIDOL LACTATE 5 MG/ML IJ SOLN: 2.5000 mg | INTRAMUSCULAR | Status: DC | PRN

## 2023-05-17 MED ORDER — DIPHENHYDRAMINE HCL 50 MG/ML IJ SOLN: 25.0000 mg | INTRAMUSCULAR | Status: DC | PRN

## 2023-05-17 MED ORDER — ACETAMINOPHEN 325 MG PO TABS: 650.0000 mg | ORAL_TABLET | Freq: Four times a day (QID) | ORAL | Status: DC | PRN

## 2023-05-17 MED ORDER — MIDODRINE HCL 5 MG PO TABS
10.0000 mg | ORAL_TABLET | Freq: Three times a day (TID) | ORAL | Status: DC
Start: 2023-05-17 — End: 2023-05-17
  Administered 2023-05-17: 10 mg via ORAL
  Filled 2023-05-17: qty 2

## 2023-05-17 MED ORDER — POLYVINYL ALCOHOL 1.4 % OP SOLN: 1.0000 [drp] | Freq: Four times a day (QID) | OPHTHALMIC | Status: DC | PRN

## 2023-05-17 MED ORDER — NOREPINEPHRINE 4 MG/250ML-% IV SOLN: 0.0000 ug/min | INTRAVENOUS | Status: DC

## 2023-05-17 MED ORDER — ONDANSETRON 4 MG PO TBDP: 4.0000 mg | ORAL_TABLET | Freq: Four times a day (QID) | ORAL | Status: DC | PRN

## 2023-05-17 MED ORDER — GLYCOPYRROLATE 0.2 MG/ML IJ SOLN: 0.2000 mg | INTRAMUSCULAR | Status: DC | PRN

## 2023-05-17 MED ORDER — MORPHINE 100MG IN NS 100ML (1MG/ML) PREMIX INFUSION
5.0000 mg/h | INTRAVENOUS | Status: DC
  Administered 2023-05-17: 5 mg/h via INTRAVENOUS
  Filled 2023-05-17: qty 100

## 2023-05-17 MED ORDER — ACETAMINOPHEN 325 MG RE SUPP: 650.0000 mg | Freq: Four times a day (QID) | RECTAL | Status: DC | PRN

## 2023-05-17 MED ORDER — SODIUM CHLORIDE 0.9 % IV SOLN
250.0000 mL | INTRAVENOUS | Status: DC
  Administered 2023-05-17: 250 mL via INTRAVENOUS

## 2023-05-17 MED ORDER — ONDANSETRON HCL 4 MG/2ML IJ SOLN: 4.0000 mg | Freq: Four times a day (QID) | INTRAMUSCULAR | Status: DC | PRN

## 2023-05-17 MED ORDER — GLYCOPYRROLATE 1 MG PO TABS: 1.0000 mg | ORAL_TABLET | ORAL | Status: DC | PRN

## 2023-05-18 LAB — BLOOD GAS, VENOUS
Bicarbonate: 17.4 mmol/L — ABNORMAL LOW (ref 20.0–28.0)
Patient temperature: 37 mmol/L — ABNORMAL HIGH (ref 0.0–2.0)
pCO2, Ven: 37 mm[Hg] — ABNORMAL LOW (ref 44–60)
pH, Ven: 7.28 (ref 7.25–7.43)
pO2, Ven: 17.4 mmol/L — ABNORMAL LOW (ref 32–45)

## 2023-05-18 LAB — URINE CULTURE: Culture: NO GROWTH

## 2023-05-21 LAB — CULTURE, BLOOD (ROUTINE X 2)
Culture: NO GROWTH
Culture: NO GROWTH

## 2023-05-29 NOTE — ED Notes (Signed)
Pt provided with pericare, clean disposable brief, clean chux, and sheets post BM. Pt repositioned in bed to comfort level.

## 2023-05-29 NOTE — ED Notes (Signed)
Pt will go on vasopressors and RT was called to bring bipap. Dr Valentina Lucks explaining plan of care to family.

## 2023-05-29 NOTE — ED Notes (Addendum)
Pt family came to get RN. Two RN listened, pt asystolic. Time of Death 1715.

## 2023-05-29 NOTE — ED Notes (Signed)
Pt in bed with family at bedside. 

## 2023-05-29 NOTE — ED Notes (Signed)
MD Denton Lank will come see pt soon. Attempting IV access for second IV and 10am lactic.

## 2023-05-29 NOTE — Progress Notes (Signed)
CRITICAL CARE TIME - HOSPITALIST ROUNDING NOTE  Pt holding for stepdown bed in the ED with persistently low BP's despite IV fluids and addition of midodrine early this AM.  Patient with ongoing tachypnea, respiratory distress.    Note signs of worsening septic shock with lactic acidosis which had improved now worsening 3.9 >> 4.5 >> above 9.   BP's with MAP remaining upper 50's to low 60's on monitor.  Family at bedside updated in detail about MRCP findings of cholangitis, management of which would required ERCP, not currently available at this facility.  Advised severity of illness and significant morbidity and mortality associated with cholangitis.  Family agreeable to try patient on vasopressors and bipap until further family could be contacted and updated and repeat labs to assess clinical status.  They agreed to DNR/DNI code status.  --Started peripheral Levophed & consult to PCCM --Placed on Bipap for respiratory distress -- not well tolerated, pt trying to remove the mask --Change antibiotics to Zosyn --Pt too unstable for transfer for ERCP, family understanding   Critical care time: 45 minutes including time spent at bedside, in coordination and communication with staff and consultants.  Review of case, review and interpretation of diagnostic data and patient's medical records.

## 2023-05-29 NOTE — ED Notes (Addendum)
Notified Denton Lank, MD of pt trending hypotension of recent BP's.

## 2023-05-29 NOTE — Assessment & Plan Note (Signed)
Complicated by Septic Shock. Now comfort care.

## 2023-05-29 NOTE — IPAL (Signed)
Interdisciplinary Goals of Care Family Meeting   Date carried out: 05/21/2023  Location of the meeting: Bedside  Member's involved: Physician, Bedside Registered Nurse, and Family Member or next of kin    GOALS OF CARE DISCUSSION  The Clinical status was relayed to family in detail- Son, nephew, daughter in law  Updated and notified of patients medical condition- Patient remains unresponsive and will not open eyes to command.   Patient is having a weak cough and struggling to remove secretions.   Patient with increased WOB and using accessory muscles to breathe Explained to family course of therapy and the modalities   Patient with Progressive multiorgan failure with a very high probablity of a very minimal chance of meaningful recovery despite all aggressive and optimal medical therapy.    Family understands the situation.  They have consented and agreed to DNR/DNI and would like to proceed with Comfort care measures.  Patient is suffering and suffocating   Family are satisfied with Plan of action and management. All questions answered  Additional CC time 32 mins   Shelda Truby Santiago Glad, M.D.  [facility]  Medical Director [facility] Medical Director [facility] Cardio-Pulmonary Department

## 2023-05-29 NOTE — Progress Notes (Signed)
Progress Note   Patient: Shannon Mckinney HQI:[id number] DOB: [birth year] DOA: 05/01/2023     1 DOS: the patient was seen and examined on 04-Jun-2023   Brief hospital course: Shannon Mckinney is a 89 year old female with history of hypothyroidism, who presented to the ED for evaluation of altered mental status, congestion, urinary frequency.  ED course: Vitals -- Temp 97.6 F, RR 40, HR 110, BP 109/67. Initial SpO2 was 88% on room air, patient was placed on 2 L nasal cannula with improvement to 91%. Labs notable for -- K 3.3, Cl 96, bicarb 19 (gap 20), glucose 211, BUN 33, Cr 1.39 (prior 0.76), Total bili 2.6. Lactic acid 8.1 >> 7.0.  CBC with no leukocytosis, Hbg 17.7.  UA was suspicious for possible UTI with pyuria.   COVID/influenza A/influenza B/RSV PCR were negative. Imaging -- Chest x-ray: no active disease. RUQ U/S due to elevated total bilirubin -- pt had +Murphy sign during study which showed cholelithiasis without signs of acute cholecystitis, but with intra and extrahepatic ductal dilation.  Patient was admitted and started on IV antibiotics for cover for intra-abdominal infection vs Uti vs pneumonia given congestion reported.  2023/06/04 -- Pt with refractory hypotension despite fluids and midodrine.  Started on peripheral Levophed pending GOC discussions and PCCM consultation RR remaining in 30-40's. Pt placed on BiPAP for work on breathing but did not tolerate it.  MRCP showed findings of acute cholangitis, probable acute cholecystitis with pneumobilia, severe gastric distention...other findings per report.  Extensive discussions with family at bedside regarding current clinical status, next steps for aggressive full scope care would include transfer to another hospital for ERCP, but pt too unstable to transport.  Discussed central line placement for vasopressors, likely need for intubation and mechanical ventilation.  Consulted PCCM and after further discussions on prognosis, family decided for  patient to be transitioned to comfort care measures.     Assessment and Plan: * Comfort measures only status As of ~12 noon today after GOC talks with family and patient suffering with progressive septic shock and multi-system organ failure. --Started on morphine infusion --Comfort care per orders --Notify provider if any signs of uncontrolled pain, distress, discomfort etc. --No labs or finger sticks, no further testing --Visitor restrictions lifted --Ongoing support for pt and family  Acute cholangitis Complicated by Septic Shock. Now comfort care.  Acute hypoxic respiratory failure (HCC) Supplement O2 if needed for pt comfort, if family desire.  AKI (acute kidney injury) (HCC) No further labs. Now comfort care.  Total bilirubin, elevated Due to acute cholangitis  Septic shock (HCC) Now comfort care.  Hypokalemia .  Pyuria .  Fatigue Fall precautions  Hyperlipidemia associated with type 2 diabetes mellitus (HCC) .  Postablative hypothyroidism .        Subjective: Pt seen with family at bedside in the ED, holding for a bed.  Pt appears extremely ill and suffering, struggling to breath, RUQ pain, feels extremely hot and unable to be comfortable.  Extensive discussions regarding her clinical condition, test results, diagnosis of septic shock due to acute cholangitis and what aggressive management would entail, including need for transport for ERCP at another hospital and pt too unstable for transport.  Pt's family together decided to keep her comfortable, not prolong her obvious suffering, and start comfort care measures.   Physical Exam: Vitals:   June 04, 2023 1200 06/04/23 1330 04-Jun-2023 1333 Jun 04, 2023 1445  BP: (!) 69/60     Pulse: 98 94 (!) 101 88  Resp: (!) 32 (!)  39 (!) 35 (!) 24  Temp:      TempSrc:      SpO2: 99% (!) 85% (!) 89% 95%   General exam: awake, alert, in acute distress in obvious pain and struggling to breath HEENT: moist mucus membranes,  hearing grossly normal  Respiratory system: CTAB diminished bases, tachypneic and using accessory muscles to breath, remained tachypneic on bipap. Cardiovascular system: tachycardic, +systolic murmur, no pedal edema.   Gastrointestinal system: soft, RUQ tenderness, no rebound or guarding Central nervous system: A&O x self hospital at least. no gross focal neurologic deficits, normal speech Extremities: moves all , no edema, normal tone Skin: dry, intact, warm, no rashes seen Psychiatry: anxious mood, congruent affect   Data Reviewed:  As reviewed above  Family Communication: multiple at bedside   Disposition: Status is: Inpatient Remains inpatient appropriate because: on comfort care measures, anticipate in hospital death in hours to possibly days    Planned Discharge Destination:  TBC    Time spent: 65 minutes including time at bedside and in coordination of care with staff and consultants, review and interpretation of diagnostic studies and labs, review of prior clinical documentation and patient history.   Author: Pennie Banter, DO 13-Jun-2023 4:50 PM  For on call review [email or web address].

## 2023-05-29 NOTE — ED Notes (Addendum)
Called RT for BiPaP trail PRN per DO order.

## 2023-05-29 NOTE — Death Summary Note (Signed)
DEATH SUMMARY   Patient Details  Name: Shannon Mckinney MRN: [id number] DOB: 08/27/1928 [id number], Shannon Schlein, MD Admission/Discharge Information   Admit Date:  2023-05-21  Date of Death:  May 22, 2023  Time of Death:  21-Jun-1714  Length of Stay: 1   Principle Cause of death: Septic shock due to acute cholangitis  Hospital Diagnoses: Principal Problem:   Comfort measures only status Active Problems:   Septic shock (HCC)   Total bilirubin, elevated   AKI (acute kidney injury) (HCC)   Acute hypoxic respiratory failure (HCC)   Acute cholangitis   Pyuria   Hypokalemia   Hyperlipidemia associated with type 2 diabetes mellitus (HCC)   Fatigue   Postablative hypothyroidism   Hospital Course: Shannon Mckinney is a 89 year old female with history of hypothyroidism, who presented to the ED for evaluation of altered mental status, congestion, urinary frequency.  ED course: Vitals -- Temp 97.6 F, RR 40, HR 110, BP 109/67. Initial SpO2 was 88% on room air, patient was placed on 2 L nasal cannula with improvement to 91%. Labs notable for -- K 3.3, Cl 96, bicarb 19 (gap 20), glucose 211, BUN 33, Cr 1.39 (prior 0.76), Total bili 2.6. Lactic acid 8.1 >> 7.0.  CBC with no leukocytosis, Hbg 17.7.  UA was suspicious for possible UTI with pyuria.   COVID/influenza A/influenza B/RSV PCR were negative. Imaging -- Chest x-ray: no active disease. RUQ U/S due to elevated total bilirubin -- pt had +Murphy sign during study which showed cholelithiasis without signs of acute cholecystitis, but with intra and extrahepatic ductal dilation.  Patient was admitted and started on IV antibiotics for cover for intra-abdominal infection vs Uti vs pneumonia given congestion reported.  05/22/2023 -- Pt with refractory hypotension despite fluids and midodrine.  Started on peripheral Levophed pending GOC discussions and PCCM consultation RR remaining in 30-40's. Pt placed on BiPAP for work on breathing but did not tolerate  it.  MRCP showed findings of acute cholangitis, probable acute cholecystitis with pneumobilia, severe gastric distention...other findings per report.  Extensive discussions with family at bedside regarding current clinical status, next steps for aggressive full scope care would include transfer to another hospital for ERCP, but pt too unstable to transport.  Discussed central line placement for vasopressors, likely need for intubation and mechanical ventilation.  Consulted PCCM and after further discussions on prognosis, family decided for patient to be transitioned to comfort care measures.     Assessment and Plan: * Comfort measures only status As of ~12 noon today after GOC talks with family and patient suffering with progressive septic shock and multi-system organ failure. --Started on morphine infusion --Comfort care per orders --Notify provider if any signs of uncontrolled pain, distress, discomfort etc. --No labs or finger sticks, no further testing --Visitor restrictions lifted --Ongoing support for pt and family  Acute cholangitis Complicated by Septic Shock. Now comfort care.  Acute hypoxic respiratory failure (HCC) Supplement O2 if needed for pt comfort, if family desire.  AKI (acute kidney injury) (HCC) No further labs. Now comfort care.  Total bilirubin, elevated Due to acute cholangitis  Septic shock (HCC) Now comfort care.  Hypokalemia .  Pyuria .  Fatigue Fall precautions  Hyperlipidemia associated with type 2 diabetes mellitus (HCC) .  Postablative hypothyroidism .      {Tip this will not be part of the note when signed There is no height or weight on file to calculate BMI. , ,  (Optional):26781}   Procedures: ***  Consultations: ***  The results of significant diagnostics from this hospitalization (including imaging, microbiology, ancillary and laboratory) are listed below for reference.   Significant Diagnostic Studies: MR ABDOMEN  MRCP WO CONTRAST  Result Date: 06-05-2023 CLINICAL DATA:  89 year old female with history of hepatic duct dilatation on right upper quadrant abdominal ultrasound. Elevated liver function tests. Abdominal pain. Evaluate for choledocholithiasis. EXAM: MRI ABDOMEN WITHOUT CONTRAST  (INCLUDING MRCP) TECHNIQUE: Multiplanar multisequence MR imaging of the abdomen was performed. Heavily T2-weighted images of the biliary and pancreatic ducts were obtained, and three-dimensional MRCP images were rendered by post processing. COMPARISON:  No prior abdominal MRI. Right upper quadrant abdominal ultrasound 05/10/2023. FINDINGS: Comment: Today's study is limited for detection and characterization of visceral and/or vascular lesions by lack of IV gadolinium. In addition, substantial respiratory motion limits portions of today's examination. Lower chest: Small bilateral pleural effusions lying dependently. Increased signal intensity in the dependent portions of the lungs bilaterally (right greater than left), likely reflective of dependent atelectasis, but poorly evaluated on today's magnetic resonance examination. Hepatobiliary: In the anterior aspect of segment 4A of the liver (axial image 15 of series 9) there is a 1.8 cm T1 hypointense, T2 hyperintense lesion which is incompletely characterized on today's noncontrast study, but statistically likely a cyst (no imaging follow-up recommended). No definite suspicious cystic or solid hepatic lesions are otherwise noted. MRCP images demonstrate severe intra and extrahepatic biliary ductal dilatation, with the common bile duct measuring up to 17 mm in the porta hepatis. There are multiple filling defects in the distal common bile duct indicative of choledocholithiasis. Multiple filling defects are also noted lying dependently in the gallbladder, indicative of cholelithiasis. There is also what appears to be a air-fluid level in the fundus of the gallbladder, concerning for  pneumobilia. Gallbladder is severely distended. Gallbladder wall appears thickened and edematous, with trace volume of pericholecystic fluid. Pancreas: No definite pancreatic mass identified on today's noncontrast examination. No pancreatic ductal dilatation noted on MRCP images. No peripancreatic fluid collections or inflammatory changes. Spleen:  Unremarkable. Adrenals/Urinary Tract: Unenhanced appearance of the kidneys and bilateral adrenal glands is unremarkable. No hydroureteronephrosis in the visualized portions of the abdomen. Stomach/Bowel: Stomach appears severely distended. Dilated loop of small bowel in the left side of the abdomen measuring up to 3.5 cm in diameter (axial image 30 of series 4). Visualized portions are otherwise unremarkable. Vascular/Lymphatic: No aneurysm identified in the visualized abdominal vasculature on today's limited noncontrast examination. No definite lymphadenopathy noted in the abdomen. Other: Small volume of ascites most evident in the perihepatic region. Diffuse soft tissue edema in the flanks bilaterally. Musculoskeletal: No aggressive appearing osseous lesions are noted in the visualized portions of the skeleton. IMPRESSION: 1. Study is positive for cholelithiasis with imaging findings concerning for probable acute cholecystitis. Surgical consultation is recommended. 2. There also appears to be some gas non dependently in the gallbladder concerning for pneumobilia. If the patient has had prior sphincterotomy, this could be a benign finding. Correlation with clinical history is recommended. 3. Choledocholithiasis with severe intra and extrahepatic biliary ductal dilatation. 4. Severe gastric distention. Small bowel dilatation in the left side of the abdomen. These findings are incompletely evaluated on today's study which did not include complete imaging of the lower abdomen and pelvis. If there is clinical concern for bowel obstruction, further evaluation with  contrast-enhanced CT of the abdomen and pelvis should be considered. 5. Small volume of perihepatic ascites. 6. Small bilateral pleural effusions with probable dependent atelectasis in the lung bases bilaterally (right  greater than left). 7. Additional incidental findings, as above. These results will be called to the ordering clinician or representative by the Radiologist Assistant, and communication documented in the PACS or [employer]. Electronically Signed   By: Trudie Reed M.D.   On: 2023-06-15 10:30   MR 3D Recon At Scanner  Result Date: 06/15/2023 CLINICAL DATA:  89 year old female with history of hepatic duct dilatation on right upper quadrant abdominal ultrasound. Elevated liver function tests. Abdominal pain. Evaluate for choledocholithiasis. EXAM: MRI ABDOMEN WITHOUT CONTRAST  (INCLUDING MRCP) TECHNIQUE: Multiplanar multisequence MR imaging of the abdomen was performed. Heavily T2-weighted images of the biliary and pancreatic ducts were obtained, and three-dimensional MRCP images were rendered by post processing. COMPARISON:  No prior abdominal MRI. Right upper quadrant abdominal ultrasound 05/08/2023. FINDINGS: Comment: Today's study is limited for detection and characterization of visceral and/or vascular lesions by lack of IV gadolinium. In addition, substantial respiratory motion limits portions of today's examination. Lower chest: Small bilateral pleural effusions lying dependently. Increased signal intensity in the dependent portions of the lungs bilaterally (right greater than left), likely reflective of dependent atelectasis, but poorly evaluated on today's magnetic resonance examination. Hepatobiliary: In the anterior aspect of segment 4A of the liver (axial image 15 of series 9) there is a 1.8 cm T1 hypointense, T2 hyperintense lesion which is incompletely characterized on today's noncontrast study, but statistically likely a cyst (no imaging follow-up recommended). No definite  suspicious cystic or solid hepatic lesions are otherwise noted. MRCP images demonstrate severe intra and extrahepatic biliary ductal dilatation, with the common bile duct measuring up to 17 mm in the porta hepatis. There are multiple filling defects in the distal common bile duct indicative of choledocholithiasis. Multiple filling defects are also noted lying dependently in the gallbladder, indicative of cholelithiasis. There is also what appears to be a air-fluid level in the fundus of the gallbladder, concerning for pneumobilia. Gallbladder is severely distended. Gallbladder wall appears thickened and edematous, with trace volume of pericholecystic fluid. Pancreas: No definite pancreatic mass identified on today's noncontrast examination. No pancreatic ductal dilatation noted on MRCP images. No peripancreatic fluid collections or inflammatory changes. Spleen:  Unremarkable. Adrenals/Urinary Tract: Unenhanced appearance of the kidneys and bilateral adrenal glands is unremarkable. No hydroureteronephrosis in the visualized portions of the abdomen. Stomach/Bowel: Stomach appears severely distended. Dilated loop of small bowel in the left side of the abdomen measuring up to 3.5 cm in diameter (axial image 30 of series 4). Visualized portions are otherwise unremarkable. Vascular/Lymphatic: No aneurysm identified in the visualized abdominal vasculature on today's limited noncontrast examination. No definite lymphadenopathy noted in the abdomen. Other: Small volume of ascites most evident in the perihepatic region. Diffuse soft tissue edema in the flanks bilaterally. Musculoskeletal: No aggressive appearing osseous lesions are noted in the visualized portions of the skeleton. IMPRESSION: 1. Study is positive for cholelithiasis with imaging findings concerning for probable acute cholecystitis. Surgical consultation is recommended. 2. There also appears to be some gas non dependently in the gallbladder concerning for  pneumobilia. If the patient has had prior sphincterotomy, this could be a benign finding. Correlation with clinical history is recommended. 3. Choledocholithiasis with severe intra and extrahepatic biliary ductal dilatation. 4. Severe gastric distention. Small bowel dilatation in the left side of the abdomen. These findings are incompletely evaluated on today's study which did not include complete imaging of the lower abdomen and pelvis. If there is clinical concern for bowel obstruction, further evaluation with contrast-enhanced CT of the abdomen and pelvis  should be considered. 5. Small volume of perihepatic ascites. 6. Small bilateral pleural effusions with probable dependent atelectasis in the lung bases bilaterally (right greater than left). 7. Additional incidental findings, as above. These results will be called to the ordering clinician or representative by the Radiologist Assistant, and communication documented in the PACS or [employer]. Electronically Signed   By: Trudie Reed M.D.   On: May 18, 2023 10:30   US Abdomen Limited RUQ (LIVER/GB)  Result Date: 05/28/2023 CLINICAL DATA:  Elevated total bilirubin. EXAM: ULTRASOUND ABDOMEN LIMITED RIGHT UPPER QUADRANT COMPARISON:  None Available. FINDINGS: Gallbladder: Small stones and sludge demonstrated in the gallbladder. No gallbladder wall thickening or edema. Murphy's sign is negative. Common bile duct: Diameter: Dilated common bile duct with maximal diameter of 14.8 mm. Lobulated appearance. No visualized common duct stones. Intrahepatic bile ducts are also dilated. Liver: Somewhat limited visualization. As visualized, no focal lesions are identified in the parenchymal pattern appears normal. Portal vein is patent on color Doppler imaging with normal direction of blood flow towards the liver. Other: Incidental note of a right pleural effusion. IMPRESSION: 1. Cholelithiasis without evidence of acute cholecystitis. 2. Intra and extrahepatic bile  duct dilatation. Cause is not identified. Consider MRCP or ERCP for further evaluation. 3. Right pleural effusion. Electronically Signed   By: Burman Nieves M.D.   On: 04/30/2023 19:27   DG Chest Port 1 View  Result Date: 05/23/2023 CLINICAL DATA:  Shortness of breath.  Concern for sepsis. EXAM: PORTABLE CHEST 1 VIEW COMPARISON:  10/19/2020. FINDINGS: Low lung volume. Elevated right hemidiaphragm again seen. Bilateral lung fields are clear. Bilateral costophrenic angles are clear. Normal cardio-mediastinal silhouette. No acute osseous abnormalities. The soft tissues are within normal limits. IMPRESSION: No active disease. Electronically Signed   By: Jules Schick M.D.   On: 05/07/2023 10:56    Microbiology: Recent Results (from the past 240 hour(s))  Resp panel by RT-PCR (RSV, Flu A&B, Covid) Anterior Nasal Swab     Status: None   Collection Time: 05/05/2023 10:46 AM   Specimen: Anterior Nasal Swab  Result Value Ref Range Status   SARS Coronavirus 2 by RT PCR NEGATIVE NEGATIVE Final    Comment: (NOTE) SARS-CoV-2 target nucleic acids are NOT DETECTED.  The SARS-CoV-2 RNA is generally detectable in upper respiratory specimens during the acute phase of infection. The lowest concentration of SARS-CoV-2 viral copies this assay can detect is 138 copies/mL. A negative result does not preclude SARS-Cov-2 infection and should not be used as the sole basis for treatment or other patient management decisions. A negative result may occur with  improper specimen collection/handling, submission of specimen other than nasopharyngeal swab, presence of viral mutation(s) within the areas targeted by this assay, and inadequate number of viral copies(<138 copies/mL). A negative result must be combined with clinical observations, patient history, and epidemiological information. The expected result is Negative.  Fact Sheet for Patients:  BloggerCourse.com  Fact Sheet for  Healthcare Providers:  SeriousBroker.it  This test is no t yet approved or cleared by the [place] FDA and  has been authorized for detection and/or diagnosis of SARS-CoV-2 by FDA under an Emergency Use Authorization (EUA). This EUA will remain  in effect (meaning this test can be used) for the duration of the COVID-19 declaration under Section 564(b)(1) of the Act, 21 U.S.C.section 360bbb-3(b)(1), unless the authorization is terminated  or revoked sooner.       Influenza A by PCR NEGATIVE NEGATIVE Final   Influenza B by PCR  NEGATIVE NEGATIVE Final    Comment: (NOTE) The Xpert Xpress SARS-CoV-2/FLU/RSV plus assay is intended as an aid in the diagnosis of influenza from Nasopharyngeal swab specimens and should not be used as a sole basis for treatment. Nasal washings and aspirates are unacceptable for Xpert Xpress SARS-CoV-2/FLU/RSV testing.  Fact Sheet for Patients: BloggerCourse.com  Fact Sheet for Healthcare Providers: SeriousBroker.it  This test is not yet approved or cleared by the [place] FDA and has been authorized for detection and/or diagnosis of SARS-CoV-2 by FDA under an Emergency Use Authorization (EUA). This EUA will remain in effect (meaning this test can be used) for the duration of the COVID-19 declaration under Section 564(b)(1) of the Act, 21 U.S.C. section 360bbb-3(b)(1), unless the authorization is terminated or revoked.     Resp Syncytial Virus by PCR NEGATIVE NEGATIVE Final    Comment: (NOTE) Fact Sheet for Patients: BloggerCourse.com  Fact Sheet for Healthcare Providers: SeriousBroker.it  This test is not yet approved or cleared by the [place] FDA and has been authorized for detection and/or diagnosis of SARS-CoV-2 by FDA under an Emergency Use Authorization (EUA). This EUA will remain in effect (meaning this  test can be used) for the duration of the COVID-19 declaration under Section 564(b)(1) of the Act, 21 U.S.C. section 360bbb-3(b)(1), unless the authorization is terminated or revoked.  Performed at [facility], [address] [place], [place] [address]   Blood Culture (routine x 2)     Status: None (Preliminary result)   Collection Time: May 30, 2023 10:52 AM   Specimen: BLOOD  Result Value Ref Range Status   Specimen Description BLOOD BLOOD RIGHT HAND  Final   Special Requests   Final    AEROBIC BOTTLE ONLY Blood Culture results may not be optimal due to an inadequate volume of blood received in culture bottles   Culture   Final    NO GROWTH < 24 HOURS Performed at [facility], [address], [place], [place] [address]    Report Status PENDING  Incomplete  Blood Culture (routine x 2)     Status: None (Preliminary result)   Collection Time: 05/30/2023  1:28 PM   Specimen: BLOOD  Result Value Ref Range Status   Specimen Description BLOOD BLOOD RIGHT ARM  Final   Special Requests   Final    BOTTLES DRAWN AEROBIC ONLY Blood Culture results may not be optimal due to an inadequate volume of blood received in culture bottles   Culture   Final    NO GROWTH < 24 HOURS Performed at [facility], [address] [place], [place] [address]    Report Status PENDING  Incomplete  C Difficile Quick Screen w PCR reflex     Status: None   Collection Time: 05/13/2023 11:00 AM   Specimen: STOOL  Result Value Ref Range Status   C Diff antigen NEGATIVE NEGATIVE Final   C Diff toxin NEGATIVE NEGATIVE Final   C Diff interpretation No C. difficile detected.  Final    Comment: Performed at [facility], [address] [place], [place] [address]  Gastrointestinal Panel by PCR , Stool     Status: None   Collection Time: 04/28/2023 11:00 AM   Specimen: STOOL  Result Value Ref Range Status   Campylobacter species NOT DETECTED NOT DETECTED Final   Plesimonas  shigelloides NOT DETECTED NOT DETECTED Final   Salmonella species NOT DETECTED NOT DETECTED Final   Yersinia enterocolitica NOT DETECTED NOT DETECTED Final  Vibrio species NOT DETECTED NOT DETECTED Final   Vibrio cholerae NOT DETECTED NOT DETECTED Final   Enteroaggregative E coli (EAEC) NOT DETECTED NOT DETECTED Final   Enteropathogenic E coli (EPEC) NOT DETECTED NOT DETECTED Final   Enterotoxigenic E coli (ETEC) NOT DETECTED NOT DETECTED Final   Shiga like toxin producing E coli (STEC) NOT DETECTED NOT DETECTED Final   Shigella/Enteroinvasive E coli (EIEC) NOT DETECTED NOT DETECTED Final   Cryptosporidium NOT DETECTED NOT DETECTED Final   Cyclospora cayetanensis NOT DETECTED NOT DETECTED Final   Entamoeba histolytica NOT DETECTED NOT DETECTED Final   Giardia lamblia NOT DETECTED NOT DETECTED Final   Adenovirus F40/41 NOT DETECTED NOT DETECTED Final   Astrovirus NOT DETECTED NOT DETECTED Final   Norovirus GI/GII NOT DETECTED NOT DETECTED Final   Rotavirus A NOT DETECTED NOT DETECTED Final   Sapovirus (I, II, IV, and V) NOT DETECTED NOT DETECTED Final    Comment: Performed at [facility], [address] [place], [place] [address]    Time spent: *** minutes  Signed: Pennie Banter, DO {dischdate:[id number]}

## 2023-05-29 NOTE — ED Notes (Signed)
Dr. Griffith at bedside  

## 2023-05-29 NOTE — ED Notes (Signed)
Pt having frequent stools, sample collected, provider informed. VS not improving, RR is 30s-40s, provider Valentina Lucks made aware. Son is at bedside.

## 2023-05-29 NOTE — ED Notes (Signed)
Pt confused, removed IV, O2, cardiac leads.  All replaced. Frequent checks continue and bed alarm remains on.

## 2023-05-29 NOTE — Consult Note (Signed)
intensity in the dependent portions of the lungs bilaterally (right greater than left), likely reflective of dependent atelectasis, but poorly evaluated on today's magnetic resonance examination. Hepatobiliary: In the anterior aspect of segment 4A of the liver (axial image 15 of series 9) there is a 1.8 cm T1 hypointense, T2 hyperintense lesion which is incompletely characterized on today's noncontrast study, but statistically likely a cyst (no imaging follow-up recommended). No definite suspicious cystic or solid hepatic lesions are otherwise noted. MRCP images demonstrate severe intra and extrahepatic biliary ductal dilatation, with the common bile duct measuring up to 17 mm in the porta hepatis. There are multiple filling defects in the distal common bile duct indicative of choledocholithiasis. Multiple filling defects are also noted lying dependently in the gallbladder, indicative of cholelithiasis. There is also what appears to be a air-fluid level in the fundus of the gallbladder, concerning for pneumobilia. Gallbladder is severely distended. Gallbladder wall appears thickened and edematous, with trace volume of pericholecystic fluid. Pancreas: No definite pancreatic mass identified on today's noncontrast examination. No pancreatic ductal dilatation noted on MRCP images. No peripancreatic fluid collections or inflammatory changes. Spleen:  Unremarkable. Adrenals/Urinary Tract: Unenhanced appearance of the kidneys and bilateral adrenal glands is unremarkable. No hydroureteronephrosis in the visualized portions of the abdomen. Stomach/Bowel: Stomach appears severely distended. Dilated loop of small bowel in the left side of the abdomen measuring up to 3.5 cm in diameter (axial image 30 of series 4).  Visualized portions are otherwise unremarkable. Vascular/Lymphatic: No aneurysm identified in the visualized abdominal vasculature on today's limited noncontrast examination. No definite lymphadenopathy noted in the abdomen. Other: Small volume of ascites most evident in the perihepatic region. Diffuse soft tissue edema in the flanks bilaterally. Musculoskeletal: No aggressive appearing osseous lesions are noted in the visualized portions of the skeleton. IMPRESSION: 1. Study is positive for cholelithiasis with imaging findings concerning for probable acute cholecystitis. Surgical consultation is recommended. 2. There also appears to be some gas non dependently in the gallbladder concerning for pneumobilia. If the patient has had prior sphincterotomy, this could be a benign finding. Correlation with clinical history is recommended. 3. Choledocholithiasis with severe intra and extrahepatic biliary ductal dilatation. 4. Severe gastric distention. Small bowel dilatation in the left side of the abdomen. These findings are incompletely evaluated on today's study which did not include complete imaging of the lower abdomen and pelvis. If there is clinical concern for bowel obstruction, further evaluation with contrast-enhanced CT of the abdomen and pelvis should be considered. 5. Small volume of perihepatic ascites. 6. Small bilateral pleural effusions with probable dependent atelectasis in the lung bases bilaterally (right greater than left). 7. Additional incidental findings, as above. These results will be called to the ordering clinician or representative by the Radiologist Assistant, and communication documented in the PACS or [employer]. Electronically Signed   By: Trudie Reed M.D.   On: 06/01/23 10:30   US Abdomen Limited RUQ (LIVER/GB)  Result Date: 05/25/2023 CLINICAL DATA:  Elevated total bilirubin. EXAM: ULTRASOUND ABDOMEN LIMITED RIGHT UPPER QUADRANT COMPARISON:  None Available. FINDINGS:  Gallbladder: Small stones and sludge demonstrated in the gallbladder. No gallbladder wall thickening or edema. Murphy's sign is negative. Common bile duct: Diameter: Dilated common bile duct with maximal diameter of 14.8 mm. Lobulated appearance. No visualized common duct stones. Intrahepatic bile ducts are also dilated. Liver: Somewhat limited visualization. As visualized, no focal lesions are identified in the parenchymal pattern appears normal. Portal vein is patent on color Doppler imaging with normal direction  intensity in the dependent portions of the lungs bilaterally (right greater than left), likely reflective of dependent atelectasis, but poorly evaluated on today's magnetic resonance examination. Hepatobiliary: In the anterior aspect of segment 4A of the liver (axial image 15 of series 9) there is a 1.8 cm T1 hypointense, T2 hyperintense lesion which is incompletely characterized on today's noncontrast study, but statistically likely a cyst (no imaging follow-up recommended). No definite suspicious cystic or solid hepatic lesions are otherwise noted. MRCP images demonstrate severe intra and extrahepatic biliary ductal dilatation, with the common bile duct measuring up to 17 mm in the porta hepatis. There are multiple filling defects in the distal common bile duct indicative of choledocholithiasis. Multiple filling defects are also noted lying dependently in the gallbladder, indicative of cholelithiasis. There is also what appears to be a air-fluid level in the fundus of the gallbladder, concerning for pneumobilia. Gallbladder is severely distended. Gallbladder wall appears thickened and edematous, with trace volume of pericholecystic fluid. Pancreas: No definite pancreatic mass identified on today's noncontrast examination. No pancreatic ductal dilatation noted on MRCP images. No peripancreatic fluid collections or inflammatory changes. Spleen:  Unremarkable. Adrenals/Urinary Tract: Unenhanced appearance of the kidneys and bilateral adrenal glands is unremarkable. No hydroureteronephrosis in the visualized portions of the abdomen. Stomach/Bowel: Stomach appears severely distended. Dilated loop of small bowel in the left side of the abdomen measuring up to 3.5 cm in diameter (axial image 30 of series 4).  Visualized portions are otherwise unremarkable. Vascular/Lymphatic: No aneurysm identified in the visualized abdominal vasculature on today's limited noncontrast examination. No definite lymphadenopathy noted in the abdomen. Other: Small volume of ascites most evident in the perihepatic region. Diffuse soft tissue edema in the flanks bilaterally. Musculoskeletal: No aggressive appearing osseous lesions are noted in the visualized portions of the skeleton. IMPRESSION: 1. Study is positive for cholelithiasis with imaging findings concerning for probable acute cholecystitis. Surgical consultation is recommended. 2. There also appears to be some gas non dependently in the gallbladder concerning for pneumobilia. If the patient has had prior sphincterotomy, this could be a benign finding. Correlation with clinical history is recommended. 3. Choledocholithiasis with severe intra and extrahepatic biliary ductal dilatation. 4. Severe gastric distention. Small bowel dilatation in the left side of the abdomen. These findings are incompletely evaluated on today's study which did not include complete imaging of the lower abdomen and pelvis. If there is clinical concern for bowel obstruction, further evaluation with contrast-enhanced CT of the abdomen and pelvis should be considered. 5. Small volume of perihepatic ascites. 6. Small bilateral pleural effusions with probable dependent atelectasis in the lung bases bilaterally (right greater than left). 7. Additional incidental findings, as above. These results will be called to the ordering clinician or representative by the Radiologist Assistant, and communication documented in the PACS or [employer]. Electronically Signed   By: Trudie Reed M.D.   On: 06/01/23 10:30   US Abdomen Limited RUQ (LIVER/GB)  Result Date: 05/25/2023 CLINICAL DATA:  Elevated total bilirubin. EXAM: ULTRASOUND ABDOMEN LIMITED RIGHT UPPER QUADRANT COMPARISON:  None Available. FINDINGS:  Gallbladder: Small stones and sludge demonstrated in the gallbladder. No gallbladder wall thickening or edema. Murphy's sign is negative. Common bile duct: Diameter: Dilated common bile duct with maximal diameter of 14.8 mm. Lobulated appearance. No visualized common duct stones. Intrahepatic bile ducts are also dilated. Liver: Somewhat limited visualization. As visualized, no focal lesions are identified in the parenchymal pattern appears normal. Portal vein is patent on color Doppler imaging with normal direction  of blood flow towards the liver. Other: Incidental note of a right pleural effusion. IMPRESSION: 1. Cholelithiasis without evidence of acute cholecystitis. 2. Intra and extrahepatic bile duct dilatation. Cause is not identified. Consider MRCP or ERCP for further evaluation. 3. Right pleural effusion. Electronically Signed   By: Burman Nieves M.D.   On: 2023/05/20 19:27   DG Chest Port 1 View  Result Date: 05/20/23 CLINICAL DATA:  Shortness of breath.  Concern for sepsis. EXAM: PORTABLE CHEST 1 VIEW COMPARISON:  10/19/2020. FINDINGS: Low lung volume. Elevated right hemidiaphragm again seen. Bilateral lung fields are clear. Bilateral costophrenic angles are clear. Normal cardio-mediastinal silhouette. No acute osseous abnormalities. The soft tissues are within normal limits. IMPRESSION: No active disease. Electronically Signed   By: Jules Schick M.D.   On: 05/20/23 10:56       Assessment/Plan:  89 yo white female with severe septic shock with acute UTI and acute cholangitis with severe resp failure and renal failure  Patient with Multiorgan failure HIGH RISK FOR INTUBATION AND CARDIAC ARREST  SEPTIC shock SOURCE-GB and UTI -use vasopressors to keep MAP>65 as needed -follow ABG and LA as needed -follow up cultures -emperic ABX -consider stress dose steroids -aggressive IV fluid Resuscitation  Severe ACUTE Hypoxic and Hypercapnic Respiratory Failure  ACUTE KIDNEY  INJURY/Renal Failure -continue Foley Catheter-assess need -Avoid nephrotoxic agents -Follow urine output, BMP -Ensure adequate renal perfusion, optimize oxygenation -Renal dose medications   Intake/Output Summary (Last 24 hours) at 05/19/2023 1210 Last data filed at 05/11/2023 1207 Gross per 24 hour  Intake 2002.11 ml  Output --  Net 2002.11 ml         Critical Care Time devoted to patient care services described in this note is 65 minutes.  Critical care was necessary to treat /prevent imminent and life-threatening deterioration. Overall, patient is critically ill, prognosis is guarded.  Patient with Multiorgan failure and at high risk for cardiac arrest and death.    Lucie Leather, M.D.  [facility]  Medical Director [facility] Medical Director [facility] Cardio-Pulmonary Department  of blood flow towards the liver. Other: Incidental note of a right pleural effusion. IMPRESSION: 1. Cholelithiasis without evidence of acute cholecystitis. 2. Intra and extrahepatic bile duct dilatation. Cause is not identified. Consider MRCP or ERCP for further evaluation. 3. Right pleural effusion. Electronically Signed   By: Burman Nieves M.D.   On: 2023/05/20 19:27   DG Chest Port 1 View  Result Date: 05/20/23 CLINICAL DATA:  Shortness of breath.  Concern for sepsis. EXAM: PORTABLE CHEST 1 VIEW COMPARISON:  10/19/2020. FINDINGS: Low lung volume. Elevated right hemidiaphragm again seen. Bilateral lung fields are clear. Bilateral costophrenic angles are clear. Normal cardio-mediastinal silhouette. No acute osseous abnormalities. The soft tissues are within normal limits. IMPRESSION: No active disease. Electronically Signed   By: Jules Schick M.D.   On: 05/20/23 10:56       Assessment/Plan:  89 yo white female with severe septic shock with acute UTI and acute cholangitis with severe resp failure and renal failure  Patient with Multiorgan failure HIGH RISK FOR INTUBATION AND CARDIAC ARREST  SEPTIC shock SOURCE-GB and UTI -use vasopressors to keep MAP>65 as needed -follow ABG and LA as needed -follow up cultures -emperic ABX -consider stress dose steroids -aggressive IV fluid Resuscitation  Severe ACUTE Hypoxic and Hypercapnic Respiratory Failure  ACUTE KIDNEY  INJURY/Renal Failure -continue Foley Catheter-assess need -Avoid nephrotoxic agents -Follow urine output, BMP -Ensure adequate renal perfusion, optimize oxygenation -Renal dose medications   Intake/Output Summary (Last 24 hours) at 05/19/2023 1210 Last data filed at 05/11/2023 1207 Gross per 24 hour  Intake 2002.11 ml  Output --  Net 2002.11 ml         Critical Care Time devoted to patient care services described in this note is 65 minutes.  Critical care was necessary to treat /prevent imminent and life-threatening deterioration. Overall, patient is critically ill, prognosis is guarded.  Patient with Multiorgan failure and at high risk for cardiac arrest and death.    Lucie Leather, M.D.  [facility]  Medical Director [facility] Medical Director [facility] Cardio-Pulmonary Department

## 2023-05-29 NOTE — Assessment & Plan Note (Signed)
As of ~12 noon today after GOC talks with family and patient suffering with progressive septic shock and multi-system organ failure. --Started on morphine infusion --Comfort care per orders --Notify provider if any signs of uncontrolled pain, distress, discomfort etc. --No labs or finger sticks, no further testing --Visitor restrictions lifted --Ongoing support for pt and family

## 2023-05-29 DEATH — deceased

## 2023-08-04 ENCOUNTER — Encounter: Payer: Medicare Other | Admitting: Family Medicine

## 2023-09-01 ENCOUNTER — Encounter: Payer: Medicare Other | Admitting: Dermatology

## 2023-11-10 ENCOUNTER — Ambulatory Visit: Payer: Medicare Other | Admitting: Family Medicine
